# Patient Record
Sex: Male | Born: 1965 | Race: White | Hispanic: No | Smoking: Current every day smoker
Health system: Southern US, Community
[De-identification: ages and names within clinical notes are randomized; demographics above are authoritative.]

## PROBLEM LIST (undated history)

## (undated) DIAGNOSIS — J45909 Unspecified asthma, uncomplicated: Secondary | ICD-10-CM

## (undated) DIAGNOSIS — E119 Type 2 diabetes mellitus without complications: Secondary | ICD-10-CM

## (undated) DIAGNOSIS — J438 Other emphysema: Secondary | ICD-10-CM

## (undated) DIAGNOSIS — I10 Essential (primary) hypertension: Secondary | ICD-10-CM

## (undated) HISTORY — DX: Essential (primary) hypertension: I10

## (undated) HISTORY — DX: Other emphysema: J43.8

---

## 2000-05-30 ENCOUNTER — Emergency Department (HOSPITAL_COMMUNITY): Admission: EM | Admit: 2000-05-30 | Discharge: 2000-05-30 | Payer: Self-pay | Admitting: Emergency Medicine

## 2000-05-30 ENCOUNTER — Encounter: Payer: Self-pay | Admitting: Emergency Medicine

## 2014-02-21 ENCOUNTER — Ambulatory Visit (HOSPITAL_COMMUNITY)
Admission: RE | Admit: 2014-02-21 | Discharge: 2014-02-21 | Disposition: A | Discharge status: Home / Self Care | Payer: BC Managed Care – PPO | Source: Ambulatory Visit | Attending: Family Medicine | Admitting: Family Medicine

## 2014-02-21 ENCOUNTER — Other Ambulatory Visit (HOSPITAL_COMMUNITY): Payer: Self-pay | Admitting: Family Medicine

## 2014-02-21 DIAGNOSIS — R918 Other nonspecific abnormal finding of lung field: Secondary | ICD-10-CM | POA: Insufficient documentation

## 2014-02-21 DIAGNOSIS — R053 Chronic cough: Secondary | ICD-10-CM

## 2014-02-21 DIAGNOSIS — R05 Cough: Secondary | ICD-10-CM

## 2014-02-21 DIAGNOSIS — F17200 Nicotine dependence, unspecified, uncomplicated: Secondary | ICD-10-CM

## 2014-02-21 DIAGNOSIS — R059 Cough, unspecified: Secondary | ICD-10-CM | POA: Insufficient documentation

## 2014-07-14 ENCOUNTER — Encounter: Payer: Self-pay | Admitting: *Deleted

## 2014-07-15 ENCOUNTER — Ambulatory Visit (INDEPENDENT_AMBULATORY_CARE_PROVIDER_SITE_OTHER): Payer: BC Managed Care – PPO | Admitting: Cardiology

## 2014-07-15 ENCOUNTER — Telehealth: Payer: Self-pay | Admitting: Cardiology

## 2014-07-15 ENCOUNTER — Encounter: Payer: Self-pay | Admitting: Cardiology

## 2014-07-15 VITALS — BP 142/95 | HR 65 | Ht 69.0 in | Wt 213.0 lb

## 2014-07-15 DIAGNOSIS — R079 Chest pain, unspecified: Secondary | ICD-10-CM

## 2014-07-15 DIAGNOSIS — I10 Essential (primary) hypertension: Secondary | ICD-10-CM | POA: Insufficient documentation

## 2014-07-15 NOTE — Patient Instructions (Signed)
Your physician has requested that you have an echocardiogram. Echocardiography is a painless test that uses sound waves to create images of your heart. It provides your doctor with information about the size and shape of your heart and how well your heart's chambers and valves are working. This procedure takes approximately one hour. There are no restrictions for this procedure. Office will contact with results via phone or letter.   Continue all current medications. Your physician has requested that you regularly monitor and record your blood pressure readings at home. Please take reading approximately 2 hours after taking medication.  Bring readings to next MD visit for his review.   Follow up in  1 month

## 2014-07-15 NOTE — Telephone Encounter (Signed)
PRECERT NEEDED FOR ECHO TO BE DONE AT [facility] j[facility]TH @10 ;5

## 2014-07-15 NOTE — Telephone Encounter (Signed)
Auth # [id number] exp 08/13/14

## 2014-07-15 NOTE — Progress Notes (Signed)
Clinical Summary Christopher Cross is a 48 y.o.male seen today as a new patient for the following medical problems.  1. HTN - from records appears bp have been difficult to control - started on clonidine 0.1 mg tid by pcp just a few days ago, home pressures have trended down to systolics 130-140s.    2. Chest pain - started approx 2-3 weeks ago. Sharp pain, midchest 6/10. Can occur at rest or with exertion. +SOB. Feels hot and sweaty. Nothing makes pain better or worst. Has occurred approx 4 times over the last 2 weeks. Can be associated with headaches at times.  - often associated with high blood pressures. Symptoms somewhat improved since starting new antihypertensives.  - has had some increasing DOE over the last several weeks. No LE edema. Can have some occasional orthopnea.    CAD risk factors: HTN, + tobacco, maternal grandfather 3 MI, maternal uncle MI 52, father CVAs.   Past Medical History  Diagnosis Date  . Unspecified essential hypertension   . Other emphysema      No Known Allergies   Current Outpatient Prescriptions  Medication Sig Dispense Refill  . aspirin 325 MG tablet Take 325 mg by mouth daily.      Marland Kitchen atenolol (TENORMIN) 50 MG tablet Take 50 mg by mouth daily.      . cloNIDine (CATAPRES) 0.1 MG tablet Take 0.1 mg by mouth 3 (three) times daily.       No current facility-administered medications for this visit.     No past surgical history on file.   No Known Allergies    Family History  Problem Relation Age of Onset  . Heart attack Other     Grandfather  . Heart attack Other     Uncle     Social History Christopher Cross reports that he has been smoking Cigarettes.  He started smoking about 31 years ago. He has been smoking about 1.50 packs per day. He has never used smokeless tobacco. Christopher Cross has no alcohol history on file.   Review of Systems CONSTITUTIONAL: No weight loss, fever, chills, weakness or fatigue.  HEENT: Eyes: No visual loss, blurred  vision, double vision or yellow sclerae.No hearing loss, sneezing, congestion, runny nose or sore throat.  SKIN: No rash or itching.  CARDIOVASCULAR: per HPI RESPIRATORY: No shortness of breath, cough or sputum.  GASTROINTESTINAL: No anorexia, nausea, vomiting or diarrhea. No abdominal pain or blood.  GENITOURINARY: No burning on urination, no polyuria NEUROLOGICAL: No headache, dizziness, syncope, paralysis, ataxia, numbness or tingling in the extremities. No change in bowel or bladder control.  MUSCULOSKELETAL: No muscle, back pain, joint pain or stiffness.  LYMPHATICS: No enlarged nodes. No history of splenectomy.  PSYCHIATRIC: No history of depression or anxiety.  ENDOCRINOLOGIC: No reports of sweating, cold or heat intolerance. No polyuria or polydipsia.  Marland Kitchen   Physical Examination p 65 bp 142/95 Wt 213 lbs BMI 31 Gen: resting comfortably, no acute distress HEENT: no scleral icterus, pupils equal round and reactive, no palptable cervical adenopathy,  CV: RRR, no m/r/g, no JVD, no carotid bruits Resp: expiratory wheezing GI: abdomen is soft, non-tender, non-distended, normal bowel sounds, no hepatosplenomegaly MSK: extremities are warm, no edema.  Skin: warm, no rash Neuro:  no focal deficits Psych: appropriate affect   Diagnostic Studies  EKG Sinus, ST depressions inferior and lateral precordial leads suggestive of strain pattern   Assessment and Plan  1. HTN - followed by pcp, just recently started on  clonidine with blood pressures trending down - consider alternative to clonidine such as CCB or diuretic that is once daily with less side effects. Pending his echo results, he may have secondary indication for alternative therapy, will f/u echo results. Will request labs from pcp - he will keep bp log and bring next visit   2. Chest pain - unclear etiology, often associated with elevated bp's. Will obtain echo as first step. EKG is abnormal with ST/T changes in inferior and  lateral precordial leads. Pending echo results will need non-invasive ischemic testing, invasive testing if low EF or WMA. - change ASA to 81mg  once his bottle of 325 runs out   3. Probable COPD - smoking history, expiratory wheezing on exam - defer management to pcp   F/u 4 weeks      Antoine PocheJonathan F. Branch, M.D., F.A.C.C.

## 2014-07-16 ENCOUNTER — Ambulatory Visit (HOSPITAL_COMMUNITY)
Admission: RE | Admit: 2014-07-16 | Discharge: 2014-07-16 | Disposition: A | Discharge status: Home / Self Care | Payer: BC Managed Care – PPO | Source: Ambulatory Visit | Attending: Cardiology | Admitting: Cardiology

## 2014-07-16 DIAGNOSIS — I519 Heart disease, unspecified: Secondary | ICD-10-CM

## 2014-07-16 DIAGNOSIS — R079 Chest pain, unspecified: Secondary | ICD-10-CM

## 2014-07-16 NOTE — Progress Notes (Signed)
  Echocardiogram 2D Echocardiogram has been performed.  Lyniah Fujita 07/16/2014, 11:24 AM

## 2014-07-25 ENCOUNTER — Encounter: Payer: Self-pay | Admitting: *Deleted

## 2014-07-25 ENCOUNTER — Telehealth: Payer: Self-pay | Admitting: *Deleted

## 2014-07-25 DIAGNOSIS — R079 Chest pain, unspecified: Secondary | ICD-10-CM

## 2014-07-25 NOTE — Telephone Encounter (Signed)
Message copied by Lesle Chris on Fri Jul 25, 2014  4:32 PM ------      Message from: [place] F      Created: Wed Jul 23, 2014 12:37 PM       Please let patient know that overall his echo looks good. He needs a stress test to further look into his chest pain. Please order nuclear stress, if he can run exercise if not then lexiscan. If exercise will need to hold atenolol day of            Dominga Ferry MD ------

## 2014-07-25 NOTE — Telephone Encounter (Signed)
Notes Recorded by Lesle Chris, LPN on 05/20/2243 at 4:30 PM Patient notified & verbalized understanding. Will put order in & forward to [facility]) for scheduling. Patient feels like he could do the Exercise Cardiolite. Advised him to have nothing to eat / drink x 4 hours prior to test & no caffeine x 24 hours. Also, he will need to hold his Atenolol the morning of test only.

## 2014-08-01 ENCOUNTER — Encounter (HOSPITAL_COMMUNITY): Payer: Self-pay

## 2014-08-01 ENCOUNTER — Encounter (HOSPITAL_COMMUNITY)
Admission: RE | Admit: 2014-08-01 | Discharge: 2014-08-01 | Disposition: A | Discharge status: Home / Self Care | Payer: BC Managed Care – PPO | Source: Ambulatory Visit | Attending: Cardiology | Admitting: Cardiology

## 2014-08-01 ENCOUNTER — Ambulatory Visit (HOSPITAL_COMMUNITY)
Admission: RE | Admit: 2014-08-01 | Discharge: 2014-08-01 | Disposition: A | Discharge status: Home / Self Care | Payer: BC Managed Care – PPO | Source: Ambulatory Visit | Attending: Cardiology | Admitting: Cardiology

## 2014-08-01 DIAGNOSIS — R079 Chest pain, unspecified: Secondary | ICD-10-CM | POA: Diagnosis present

## 2014-08-01 MED ORDER — SODIUM CHLORIDE 0.9 % IJ SOLN
10.0000 mL | INTRAMUSCULAR | Status: DC | PRN
Start: 2014-08-01 — End: 2014-08-02
  Administered 2014-08-01: 10 mL via INTRAVENOUS

## 2014-08-01 MED ORDER — TECHNETIUM TC 99M SESTAMIBI - CARDIOLITE
10.0000 | Freq: Once | INTRAVENOUS | Status: AC | PRN
  Administered 2014-08-01: 10 via INTRAVENOUS

## 2014-08-01 MED ORDER — REGADENOSON 0.4 MG/5ML IV SOLN
INTRAVENOUS | Status: AC
  Filled 2014-08-01: qty 5

## 2014-08-01 MED ORDER — TECHNETIUM TC 99M SESTAMIBI GENERIC - CARDIOLITE
30.0000 | Freq: Once | INTRAVENOUS | Status: AC | PRN
  Administered 2014-08-01: 30 via INTRAVENOUS

## 2014-08-01 MED ORDER — SODIUM CHLORIDE 0.9 % IJ SOLN
INTRAMUSCULAR | Status: AC
  Administered 2014-08-01: 10 mL via INTRAVENOUS
  Filled 2014-08-01: qty 10

## 2014-08-01 NOTE — Progress Notes (Signed)
Stress Lab Nurses Notes - Christopher Cross 08/01/2014 Reason for doing test: Chest Pain and HTN Type of test: Stress Cardiolite Nurse performing test: Parke Poisson, RN Nuclear Medicine Tech: Lyndel Pleasure Echo Tech: Not Applicable MD performing test: Nilda Riggs MD: [facility] Test explained and consent signed: Yes.   IV started: Saline lock flushed, No redness or edema and Saline lock started in radiology Symptoms: SOB Treatment/Intervention: None Reason test stopped: fatigue and reached target HR After recovery IV was: Discontinued via X-ray tech and No redness or edema Patient to return to Nuc. Med at : 11:35 Patient discharged: Home Patient's Condition upon discharge was: stable Comments: During test peak BP 220/101 & HR 164.  Recovery BP 168/102 & HR 81.  Symptoms resolved in recovery.  Christopher Cross

## 2014-08-06 ENCOUNTER — Telehealth: Payer: Self-pay | Admitting: *Deleted

## 2014-08-06 ENCOUNTER — Encounter (HOSPITAL_COMMUNITY)
Admission: RE | Admit: 2014-08-06 | Discharge: 2014-08-06 | Disposition: A | Discharge status: Home / Self Care | Payer: BC Managed Care – PPO | Source: Ambulatory Visit | Attending: Cardiology | Admitting: Cardiology

## 2014-08-06 DIAGNOSIS — R079 Chest pain, unspecified: Secondary | ICD-10-CM

## 2014-08-06 HISTORY — DX: Unspecified asthma, uncomplicated: J45.909

## 2014-08-06 NOTE — Telephone Encounter (Signed)
Message copied by Jerrye Beavers on Wed Aug 06, 2014  2:56 PM ------      Message from: [place] F      Created: Tue Aug 05, 2014  9:34 AM       Negative stress test. Does not appear his chest pain is heart related. Previously scheduled follow up            Dominga Ferry MD ------

## 2014-08-06 NOTE — Telephone Encounter (Signed)
Notes Recorded by Lesle Chris, LPN on 8/50/2774 at 4:50 PM Patient notified and verbalized understanding. Will keep already scheduled follow up for 08/15/2014 with Dr. Wyline Mood. ------  Notes Recorded by Jerrye Beavers, CMA on 08/06/2014 at 2:55 PM Left message to return call. ------  Notes Recorded by Antoine Poche, MD on 08/05/2014 at 9:34 AM Negative stress test. Does not appear his chest pain is heart related. Previously scheduled follow up  Dominga Ferry MD

## 2014-08-15 ENCOUNTER — Ambulatory Visit (INDEPENDENT_AMBULATORY_CARE_PROVIDER_SITE_OTHER): Payer: BC Managed Care – PPO | Admitting: Cardiology

## 2014-08-15 ENCOUNTER — Encounter: Payer: Self-pay | Admitting: Cardiology

## 2014-08-15 VITALS — BP 148/96 | HR 68 | Ht 69.0 in | Wt 204.4 lb

## 2014-08-15 DIAGNOSIS — I10 Essential (primary) hypertension: Secondary | ICD-10-CM

## 2014-08-15 DIAGNOSIS — R079 Chest pain, unspecified: Secondary | ICD-10-CM

## 2014-08-15 MED ORDER — CLONIDINE HCL 0.1 MG PO TABS: 0.1000 mg | ORAL_TABLET | Freq: Two times a day (BID) | ORAL | Status: DC

## 2014-08-15 MED ORDER — RANITIDINE HCL 150 MG PO TABS: 150.0000 mg | ORAL_TABLET | Freq: Two times a day (BID) | ORAL | Status: DC

## 2014-08-15 MED ORDER — AMLODIPINE BESYLATE 5 MG PO TABS: 5.0000 mg | ORAL_TABLET | Freq: Every day | ORAL | Status: DC

## 2014-08-15 MED ORDER — ASPIRIN EC 81 MG PO TBEC: 81.0000 mg | DELAYED_RELEASE_TABLET | Freq: Every day | ORAL | Status: DC

## 2014-08-15 NOTE — Progress Notes (Signed)
Clinical Summary Christopher Cross is a 48 y.o.male seen today for follow up of the following medical problems.   1. HTN  - from records appears bp have been difficult to control  - started on clonidine 0.1 mg tid by pcp recently.  - checks at home, typically around 150-160/80s.   2. Chest pain  - described at last visit, since that time completed echo with normal LV systolic function, stress MPI with no ischemia and low risk Duke score of 7.  - no recurrent symptoms since last visit   Past Medical History  Diagnosis Date  . Unspecified essential hypertension   . Other emphysema   . Asthma      No Known Allergies   Current Outpatient Prescriptions  Medication Sig Dispense Refill  . aspirin 325 MG tablet Take 325 mg by mouth daily.      Marland Kitchen atenolol (TENORMIN) 50 MG tablet Take 50 mg by mouth daily.      . cloNIDine (CATAPRES) 0.1 MG tablet Take 0.1 mg by mouth 3 (three) times daily.      . nitroGLYCERIN (NITROSTAT) 0.4 MG SL tablet Place 0.4 mg under the tongue every 5 (five) minutes as needed for chest pain.       No current facility-administered medications for this visit.     No past surgical history on file.   No Known Allergies    Family History  Problem Relation Age of Onset  . Heart attack Other     Grandfather  . Heart attack Other     Uncle     Social History Christopher Cross reports that he has been smoking Cigarettes.  He started smoking about 31 years ago. He has been smoking about 1.50 packs per day. He has never used smokeless tobacco. Mr. Markunas has no alcohol history on file.   Review of Systems CONSTITUTIONAL: No weight loss, fever, chills, weakness or fatigue.  HEENT: Eyes: No visual loss, blurred vision, double vision or yellow sclerae.No hearing loss, sneezing, congestion, runny nose or sore throat.  SKIN: No rash or itching.  CARDIOVASCULAR: per HPI RESPIRATORY: No shortness of breath, cough or sputum.  GASTROINTESTINAL: No anorexia, nausea,  vomiting or diarrhea. No abdominal pain or blood.  GENITOURINARY: No burning on urination, no polyuria NEUROLOGICAL: No headache, dizziness, syncope, paralysis, ataxia, numbness or tingling in the extremities. No change in bowel or bladder control.  MUSCULOSKELETAL: No muscle, back pain, joint pain or stiffness.  LYMPHATICS: No enlarged nodes. No history of splenectomy.  PSYCHIATRIC: No history of depression or anxiety.  ENDOCRINOLOGIC: No reports of sweating, cold or heat intolerance. No polyuria or polydipsia.  Marland Kitchen   Physical Examination p 68 bp 148/96 Wt 204 lbs BMI 30 Gen: resting comfortably, no acute distress HEENT: no scleral icterus, pupils equal round and reactive, no palptable cervical adenopathy,  CV: RRR, no m/r/g, no JVD, no carotid brutis Resp: Clear to auscultation bilaterally GI: abdomen is soft, non-tender, non-distended, normal bowel sounds, no hepatosplenomegaly MSK: extremities are warm, no edema.  Skin: warm, no rash Neuro:  no focal deficits Psych: appropriate affect   Diagnostic Studies 06/2014 Echo Study Conclusions  - Left ventricle: The cavity size was at the upper limits of normal. Wall thickness was normal. Systolic function was normal. The estimated ejection fraction was in the range of 50% to 55%. Wall motion was normal; there were no regional wall motion abnormalities. Doppler parameters are consistent with abnormal left ventricular relaxation (grade 1 diastolic dysfunction). -  Aortic valve: Mildly calcified annulus. Trileaflet. There was no significant regurgitation. - Mitral valve: There was trivial regurgitation. - Right atrium: Central venous pressure (est): 3 mm Hg. - Atrial septum: No defect or patent foramen ovale was identified. - Tricuspid valve: There was physiologic regurgitation. - Pulmonary arteries: Systolic pressure could not be accurately estimated. - Pericardium, extracardiac: There was no pericardial effusion.  Impressions:  -  Upper normal LV chamber size with LVEF 50-55%, grade 1 diastolic dysfunction. No major valvular abnormalities. Unable to assess PASP.  07/2014 MPI  IMPRESSION:  1. Negative exercise MPI for ischemia  2. Normal LV systolic function, LVEF 48%  3. Duke score of 7, consistent with low risk for major cardiac  events.  4. Very good exercise function capacity (120% of predicted based on  age and gender).   Assessment and Plan  1. HTN  - followed by pcp, just recently started on clonidine with blood pressures trending down  - consider alternative to clonidine such as CCB or diuretic that is once daily with less side effects. Will start norvasc  daily, decrease clonidine to 0.1mg  bid with goal of further weaning after bp log submitted in 2 weeks. If needs further bp control increase norvasc, next agent would be thiazide diuretic.  - given info on DASH diet   2. Chest pain  - normal echo and stress test, no evidence for caridiac cause - does describe some GERD at times, recommended OTC zantac  bid    F/u 4 months. BP log to be submitted in 2 weeks.    Antoine Poche, M.D., F.A.C.C.

## 2014-08-15 NOTE — Patient Instructions (Signed)
   Decrease Aspirin to 81mg  daily   Decrease Clonidine to 0.1mg  twice a day    Begin OTC Zantac 150mg  twice a day    Begin Norvasc 5mg  daily - new sent to pharm  DASH diet info provided  Your physician has requested that you regularly monitor and record your blood pressure readings at home. Please take readings approximately 2 hours after medication x 2 weeks & return to office for MD review. Continue all other medications.   Your physician wants you to follow up in:  4 months.  You will receive a reminder letter in the mail one-two months in advance.  If you don't receive a letter, please call our office to schedule the follow up appointment

## 2014-09-22 ENCOUNTER — Telehealth: Payer: Self-pay | Admitting: *Deleted

## 2014-09-22 NOTE — Telephone Encounter (Signed)
Message copied by Jerrye Beavers on Mon Sep 22, 2014  2:25 PM ------      Message from: [place] F      Created: Mon Sep 22, 2014  8:56 AM       BP log reviewed, on average bp's too elevated. Would increase norvasc to 10mg  daily. Continue check home bp's 2-3 times a week until next follow up with [place] or primary                  Dominga Ferry MD ------

## 2014-09-22 NOTE — Telephone Encounter (Signed)
Line busy, will call back later

## 2014-09-23 NOTE — Telephone Encounter (Signed)
Left message to return call 

## 2014-09-26 NOTE — Telephone Encounter (Signed)
Line rings. Unable to leave message

## 2014-10-06 ENCOUNTER — Other Ambulatory Visit: Payer: Self-pay | Admitting: *Deleted

## 2014-10-06 ENCOUNTER — Encounter: Payer: Self-pay | Admitting: *Deleted

## 2014-10-06 MED ORDER — AMLODIPINE BESYLATE 10 MG PO TABS: 10.0000 mg | ORAL_TABLET | Freq: Every day | ORAL | Status: DC

## 2014-10-06 NOTE — Telephone Encounter (Signed)
Unable to reach pt after 3 attempts. Letter will be mailed and rx has been sent to pharmacy.

## 2014-12-02 ENCOUNTER — Encounter: Payer: Self-pay | Admitting: Cardiology

## 2014-12-02 ENCOUNTER — Ambulatory Visit (INDEPENDENT_AMBULATORY_CARE_PROVIDER_SITE_OTHER): Payer: BC Managed Care – PPO | Admitting: Cardiology

## 2014-12-02 VITALS — BP 110/71 | HR 64 | Ht 69.0 in | Wt 198.0 lb

## 2014-12-02 DIAGNOSIS — R079 Chest pain, unspecified: Secondary | ICD-10-CM

## 2014-12-02 DIAGNOSIS — Z720 Tobacco use: Secondary | ICD-10-CM

## 2014-12-02 DIAGNOSIS — I10 Essential (primary) hypertension: Secondary | ICD-10-CM

## 2014-12-02 MED ORDER — ALBUTEROL SULFATE HFA 108 (90 BASE) MCG/ACT IN AERS: 1.0000 | INHALATION_SPRAY | Freq: Four times a day (QID) | RESPIRATORY_TRACT | Status: DC | PRN

## 2014-12-02 MED ORDER — ATENOLOL 50 MG PO TABS: 50.0000 mg | ORAL_TABLET | Freq: Every day | ORAL | Status: DC

## 2014-12-02 MED ORDER — AMLODIPINE BESYLATE 10 MG PO TABS: 10.0000 mg | ORAL_TABLET | Freq: Every day | ORAL | Status: DC

## 2014-12-02 NOTE — Patient Instructions (Signed)
   Stop Clonidine  Refills given on Atenolol, Amlodipine, & Albuterol Your physician has requested that you regularly monitor and record your blood pressure readings at home. Please take readings approximately 2 hours after medication. Take 2-3 x per week for 2 weeks & return to office for MD review.  Continue all other medications.   Your physician wants you to follow up in:  1 year.  You will receive a reminder letter in the mail one-two months in advance.  If you don't receive a letter, please call our office to schedule the follow up appointment

## 2014-12-02 NOTE — Progress Notes (Signed)
Clinical Summary [name] is a 48 y.o.male seen today for follow up of the following medical problems.   1. HTN  - checks at home occasionally, does not recall numbers - compliant with meds, we have been weaning his clonidine, he is down to 0.1mg  once daily.   2. Chest pain  - described at last visit, since that time completed echo with normal LV systolic function, stress MPI with no ischemia and low risk Duke score of 7.  - no recurrent symptoms since last visit  3. Tobacco abuse - no benefit with ecig, notes some success with nicotine patches in the past.    Past Medical History  Diagnosis Date  . Unspecified essential hypertension   . Other emphysema   . Asthma      No Known Allergies   Current Outpatient Prescriptions  Medication Sig Dispense Refill  . albuterol (PROVENTIL HFA;VENTOLIN HFA) 108 (90 BASE) MCG/ACT inhaler Inhale 1-2 puffs into the lungs every 6 (six) hours as needed for wheezing or shortness of breath.    Marland Kitchen. amLODipine (NORVASC) 10 MG tablet Take 1 tablet (10 mg total) by mouth daily. 30 tablet 6  . aspirin EC 81 MG tablet Take 1 tablet (81 mg total) by mouth daily.    Marland Kitchen. atenolol (TENORMIN) 50 MG tablet Take 50 mg by mouth daily.    . cloNIDine (CATAPRES) 0.1 MG tablet Take 1 tablet (0.1 mg total) by mouth 2 (two) times daily.    . nitroGLYCERIN (NITROSTAT) 0.4 MG SL tablet Place 0.4 mg under the tongue every 5 (five) minutes as needed for chest pain.    . ranitidine (ZANTAC) 150 MG tablet Take 1 tablet (150 mg total) by mouth 2 (two) times daily.     No current facility-administered medications for this visit.     No past surgical history on file.   No Known Allergies    Family History  Problem Relation Age of Onset  . Heart attack Other     Grandfather  . Heart attack Other     Uncle     Social History [name] reports that he has been smoking Cigarettes.  He started smoking about 31 years ago. He has been smoking about 1.50  packs per day. He has never used smokeless tobacco. [name] has no alcohol history on file.   Review of Systems CONSTITUTIONAL: No weight loss, fever, chills, weakness or fatigue.  HEENT: Eyes: No visual loss, blurred vision, double vision or yellow sclerae.No hearing loss, sneezing, congestion, runny nose or sore throat.  SKIN: No rash or itching.  CARDIOVASCULAR: per HPI RESPIRATORY: No shortness of breath, cough or sputum.  GASTROINTESTINAL: No anorexia, nausea, vomiting or diarrhea. No abdominal pain or blood.  GENITOURINARY: No burning on urination, no polyuria NEUROLOGICAL: No headache, dizziness, syncope, paralysis, ataxia, numbness or tingling in the extremities. No change in bowel or bladder control.  MUSCULOSKELETAL: No muscle, back pain, joint pain or stiffness.  LYMPHATICS: No enlarged nodes. No history of splenectomy.  PSYCHIATRIC: No history of depression or anxiety.  ENDOCRINOLOGIC: No reports of sweating, cold or heat intolerance. No polyuria or polydipsia.  Marland Kitchen.   Physical Examination p 64 bp 110/71 Wt 198 lbs BMI 29 Gen: resting comfortably, no acute distress HEENT: no scleral icterus, pupils equal round and reactive, no palptable cervical adenopathy,  CV: RRR, no m/r/g, no JVD, no carotid bruits Resp: Clear to auscultation bilaterally GI: abdomen is soft, non-tender, non-distended, normal bowel sounds, no hepatosplenomegaly  MSK: extremities are warm, no edema.  Skin: warm, no rash Neuro:  no focal deficits Psych: appropriate affect   Diagnostic Studies 06/2014 Echo Study Conclusions  - Left ventricle: The cavity size was at the upper limits of normal. Wall thickness was normal. Systolic function was normal. The estimated ejection fraction was in the range of 50% to 55%. Wall motion was normal; there were no regional wall motion abnormalities. Doppler parameters are consistent with abnormal left ventricular relaxation (grade 1 diastolic dysfunction). - Aortic  valve: Mildly calcified annulus. Trileaflet. There was no significant regurgitation. - Mitral valve: There was trivial regurgitation. - Right atrium: Central venous pressure (est): 3 mm Hg. - Atrial septum: No defect or patent foramen ovale was identified. - Tricuspid valve: There was physiologic regurgitation. - Pulmonary arteries: Systolic pressure could not be accurately estimated. - Pericardium, extracardiac: There was no pericardial effusion.  Impressions:  - Upper normal LV chamber size with LVEF 50-55%, grade 1 diastolic dysfunction. No major valvular abnormalities. Unable to assess PASP.  07/2014 MPI  IMPRESSION:  1. Negative exercise MPI for ischemia  2. Normal LV systolic function, LVEF 48%  3. Duke score of 7, consistent with low risk for major cardiac  events.  4. Very good exercise function capacity (120% of predicted based on  age and gender).    Assessment and Plan  1. HTN  - at goal, we will discontinue his clonidicine. Continue norvasc and atenolol. - he will submit bp log in 2 weeks  2. Chest pain  - normal echo and stress test, no evidence for caridiac cause - no recent symptoms  3. Tobacco abuse - precontemplative, not ready to quit    F/u 1 year    Antoine Poche, M.D.

## 2015-02-04 ENCOUNTER — Other Ambulatory Visit: Payer: Self-pay | Admitting: Cardiology

## 2015-02-04 NOTE — Telephone Encounter (Signed)
Patient walked into clinic requesting medication.  He took his last pill this morning.  Uses [facility] atenolol (TENORMIN) 50 MG tablet   Also, he is requesting a new BP sheet

## 2015-02-04 NOTE — Telephone Encounter (Signed)
Atenolol was refilled on 11/2014 with 11 refills to [facility]. LM for pt. Will mail BP log.

## 2015-11-26 ENCOUNTER — Ambulatory Visit: Payer: Self-pay | Admitting: Cardiology

## 2015-12-05 ENCOUNTER — Other Ambulatory Visit: Payer: Self-pay | Admitting: Cardiology

## 2015-12-28 ENCOUNTER — Encounter: Payer: Self-pay | Admitting: Cardiology

## 2015-12-28 ENCOUNTER — Ambulatory Visit (INDEPENDENT_AMBULATORY_CARE_PROVIDER_SITE_OTHER): Payer: 59 | Admitting: Cardiology

## 2015-12-28 VITALS — BP 162/88 | HR 71 | Ht 69.0 in | Wt 225.8 lb

## 2015-12-28 DIAGNOSIS — I10 Essential (primary) hypertension: Secondary | ICD-10-CM | POA: Diagnosis not present

## 2015-12-28 DIAGNOSIS — R600 Localized edema: Secondary | ICD-10-CM

## 2015-12-28 DIAGNOSIS — R079 Chest pain, unspecified: Secondary | ICD-10-CM | POA: Diagnosis not present

## 2015-12-28 MED ORDER — CHLORTHALIDONE 25 MG PO TABS: 12.5000 mg | ORAL_TABLET | Freq: Every day | ORAL | Status: DC

## 2015-12-28 MED ORDER — AMLODIPINE BESYLATE 10 MG PO TABS: 10.0000 mg | ORAL_TABLET | Freq: Every day | ORAL | Status: DC

## 2015-12-28 MED ORDER — ATENOLOL 50 MG PO TABS: 50.0000 mg | ORAL_TABLET | Freq: Every day | ORAL | Status: DC

## 2015-12-28 NOTE — Progress Notes (Signed)
Patient ID: Christopher Cross, male   DOB: Feb 16, 1966, 50 y.o.   MRN: [id number]     Clinical Summary Christopher Cross is a 50 y.o.male seen today for follow up of the following medical problems.   1. HTN  - compliant with meds - he does not check regularly at home   2. History of chest pain  - 03/2014  stress MPI with no ischemia and low risk Duke score of 7.  - denies any recent symptoms.   3. LE edema - started over the last few days. He reports very heavy salt intake over the holidays as well as over the last few days.   Past Medical History  Diagnosis Date  . Unspecified essential hypertension   . Other emphysema   . Asthma      No Known Allergies   Current Outpatient Prescriptions  Medication Sig Dispense Refill  . albuterol (PROVENTIL HFA;VENTOLIN HFA) 108 (90 BASE) MCG/ACT inhaler Inhale 1-2 puffs into the lungs every 6 (six) hours as needed for wheezing or shortness of breath. 1 each 2  . amLODipine (NORVASC) 10 MG tablet TAKE 1 TABLET BY MOUTH EVERY DAY 30 tablet 0  . aspirin EC 81 MG tablet Take 1 tablet (81 mg total) by mouth daily.    Marland Kitchen atenolol (TENORMIN) 50 MG tablet Take 1 tablet (50 mg total) by mouth daily. 30 tablet 11  . nitroGLYCERIN (NITROSTAT) 0.4 MG SL tablet Place 0.4 mg under the tongue every 5 (five) minutes as needed for chest pain.    . ranitidine (ZANTAC) 150 MG tablet Take 1 tablet (150 mg total) by mouth 2 (two) times daily.     No current facility-administered medications for this visit.     No past surgical history on file.   No Known Allergies    Family History  Problem Relation Age of Onset  . Heart attack Other     Grandfather  . Heart attack Other     Uncle     Social History Christopher Cross reports that he has been smoking Cigarettes.  He started smoking about 32 years ago. He has been smoking about 1.50 packs per day. He has never used smokeless tobacco. Christopher Cross has no alcohol history on file.   Review of Systems CONSTITUTIONAL:  No weight loss, fever, chills, weakness or fatigue.  HEENT: Eyes: No visual loss, blurred vision, double vision or yellow sclerae.No hearing loss, sneezing, congestion, runny nose or sore throat.  SKIN: No rash or itching.  CARDIOVASCULAR: per HPI RESPIRATORY: No shortness of breath, cough or sputum.  GASTROINTESTINAL: No anorexia, nausea, vomiting or diarrhea. No abdominal pain or blood.  GENITOURINARY: No burning on urination, no polyuria NEUROLOGICAL: No headache, dizziness, syncope, paralysis, ataxia, numbness or tingling in the extremities. No change in bowel or bladder control.  MUSCULOSKELETAL: No muscle, back pain, joint pain or stiffness.  LYMPHATICS: No enlarged nodes. No history of splenectomy.  PSYCHIATRIC: No history of depression or anxiety.  ENDOCRINOLOGIC: No reports of sweating, cold or heat intolerance. No polyuria or polydipsia.  Marland Kitchen   Physical Examination Filed Vitals:   12/28/15 1538  BP: 162/88  Pulse: 71   Filed Vitals:   12/28/15 1538  Height:  (1.753 m)  Weight: 225 lb 12.8 oz (102.422 kg)    Gen: resting comfortably, no acute distress HEENT: no scleral icterus, pupils equal round and reactive, no palptable cervical adenopathy,  CV: RRR, no m/r/g, no jvd Resp: Clear to auscultation bilaterally GI: abdomen is  soft, non-tender, non-distended, normal bowel sounds, no hepatosplenomegaly MSK: extremities are warm, no edema.  Skin: warm, no rash Neuro:  no focal deficits Psych: appropriate affect   Diagnostic Studies  06/2014 Echo Study Conclusions  - Left ventricle: The cavity size was at the upper limits of normal. Wall thickness was normal. Systolic function was normal. The estimated ejection fraction was in the range of 50% to 55%. Wall motion was normal; there were no regional wall motion abnormalities. Doppler parameters are consistent with abnormal left ventricular relaxation (grade 1 diastolic dysfunction). - Aortic valve: Mildly calcified  annulus. Trileaflet. There was no significant regurgitation. - Mitral valve: There was trivial regurgitation. - Right atrium: Central venous pressure (est): 3 mm Hg. - Atrial septum: No defect or patent foramen ovale was identified. - Tricuspid valve: There was physiologic regurgitation. - Pulmonary arteries: Systolic pressure could not be accurately estimated. - Pericardium, extracardiac: There was no pericardial effusion.  Impressions:  - Upper normal LV chamber size with LVEF 50-55%, grade 1 diastolic dysfunction. No major valvular abnormalities. Unable to assess PASP.  07/2014 MPI  IMPRESSION:  1. Negative exercise MPI for ischemia  2. Normal LV systolic function, LVEF 48%  3. Duke score of 7, consistent with low risk for major cardiac  events.  4. Very good exercise function capacity (120% of predicted based on  age and gender).      Assessment and Plan   1. HTN  - above goal. We will start chlorthalidone 12.5mg  daily, check BMET and Mg in 2 weeks.  - he will drop off bp log in 1 week  2. Chest pain  - previous normal stress test, no recurrent symptpoms. Continue to monitor  3. LE edema - likely related to recent heavy sodium intake. Echo 06/2014 with normal LVEF, grade I diastolic dysfunction - start chlorthalidone 12.5mg  daily.    F/u 2 months  Christopher Cross, M.D.

## 2015-12-28 NOTE — Patient Instructions (Signed)
Your physician recommends that you schedule a follow-up appointment in: 2 MONTHS WITH DR. BRANCH  Your physician has recommended you make the following change in your medication:   START CHLORTHALIDONE 12.5 MG DAILY  Your physician recommends that you return for lab work in: 2 WEEKS BMP/MG  Your physician has requested that you regularly monitor and record your blood pressure readings at home FOR 1 WEEK AND CALL us WITH RESULTS. Please use the same machine at the same time of day to check your readings and record them to bring to your follow-up visit.  Thank you for choosing [facility]!!

## 2016-01-11 ENCOUNTER — Encounter: Payer: Self-pay | Admitting: *Deleted

## 2016-03-01 ENCOUNTER — Encounter: Payer: Self-pay | Admitting: Cardiology

## 2016-03-01 ENCOUNTER — Ambulatory Visit (INDEPENDENT_AMBULATORY_CARE_PROVIDER_SITE_OTHER): Payer: 59 | Admitting: Cardiology

## 2016-03-01 ENCOUNTER — Encounter: Payer: Self-pay | Admitting: *Deleted

## 2016-03-01 VITALS — BP 143/84 | HR 64 | Ht 69.0 in | Wt 211.2 lb

## 2016-03-01 DIAGNOSIS — R600 Localized edema: Secondary | ICD-10-CM | POA: Diagnosis not present

## 2016-03-01 DIAGNOSIS — I10 Essential (primary) hypertension: Secondary | ICD-10-CM

## 2016-03-01 DIAGNOSIS — R079 Chest pain, unspecified: Secondary | ICD-10-CM

## 2016-03-01 MED ORDER — SILDENAFIL CITRATE 100 MG PO TABS: ORAL_TABLET | ORAL | Status: DC

## 2016-03-01 NOTE — Patient Instructions (Signed)
Your physician wants you to follow-up in: 6 MONTHS WITH DR. BRANCH You will receive a reminder letter in the mail two months in advance. If you don't receive a letter, please call our office to schedule the follow-up appointment.  Your physician has recommended you make the following change in your medication:   START VIAGRA 100 MG TAKE 1/2 TAB 30 MINS PRIOR   Thank you for choosing [facility]!!

## 2016-03-01 NOTE — Progress Notes (Signed)
Patient ID: Christopher Cross, male   DOB: 1966-04-04, 50 y.o.   MRN: [id number]     Clinical Summary Christopher Cross is a 50 y.o.male seen today for follow up of the following medical problems.   1. HTN  - compliant with meds - he does not check regularly at home - last visit started chlorthalidone 12.5mg  daily, tolerating well without side effects.    2. History of chest pain  - 03/2014 stress MPI with no ischemia and low risk Duke score of 7.  - denies any recent symptoms since last visit  3. LE edema - LE edema resolved  since starting chlorthalidone at last visit    Past Medical History  Diagnosis Date  . Unspecified essential hypertension   . Other emphysema (HCC)   . Asthma      No Known Allergies   Current Outpatient Prescriptions  Medication Sig Dispense Refill  . albuterol (PROVENTIL HFA;VENTOLIN HFA) 108 (90 BASE) MCG/ACT inhaler Inhale 1-2 puffs into the lungs every 6 (six) hours as needed for wheezing or shortness of breath. 1 each 2  . amLODipine (NORVASC) 10 MG tablet Take 1 tablet (10 mg total) by mouth daily. 30 tablet 6  . aspirin EC 81 MG tablet Take 1 tablet (81 mg total) by mouth daily.    Marland Kitchen atenolol (TENORMIN) 50 MG tablet Take 1 tablet (50 mg total) by mouth daily. 30 tablet 6  . chlorthalidone (HYGROTON) 25 MG tablet Take 0.5 tablets (12.5 mg total) by mouth daily. 45 tablet 3  . nitroGLYCERIN (NITROSTAT) 0.4 MG SL tablet Place 0.4 mg under the tongue every 5 (five) minutes as needed for chest pain.    . ranitidine (ZANTAC) 150 MG tablet Take 1 tablet (150 mg total) by mouth 2 (two) times daily.     No current facility-administered medications for this visit.     No past surgical history on file.   No Known Allergies    Family History  Problem Relation Age of Onset  . Heart attack Other     Grandfather  . Heart attack Other     Uncle     Social History Christopher Cross reports that he has quit smoking. His smoking use included Cigarettes. He  started smoking about 2 months ago. He smoked 1.50 packs per day. He has never used smokeless tobacco. Christopher Cross has no alcohol history on file.   Review of Systems CONSTITUTIONAL: No weight loss, fever, chills, weakness or fatigue.  HEENT: Eyes: No visual loss, blurred vision, double vision or yellow sclerae.No hearing loss, sneezing, congestion, runny nose or sore throat.  SKIN: No rash or itching.  CARDIOVASCULAR: no chest pain, no palpitations.  RESPIRATORY: No shortness of breath, cough or sputum.  GASTROINTESTINAL: No anorexia, nausea, vomiting or diarrhea. No abdominal pain or blood.  GENITOURINARY: No burning on urination, no polyuria NEUROLOGICAL: No headache, dizziness, syncope, paralysis, ataxia, numbness or tingling in the extremities. No change in bowel or bladder control.  MUSCULOSKELETAL: No muscle, back pain, joint pain or stiffness.  LYMPHATICS: No enlarged nodes. No history of splenectomy.  PSYCHIATRIC: No history of depression or anxiety.  ENDOCRINOLOGIC: No reports of sweating, cold or heat intolerance. No polyuria or polydipsia.  Marland Kitchen   Physical Examination Filed Vitals:   03/01/16 1615  BP: 143/84  Pulse: 64   Filed Vitals:   03/01/16 1615  Height:  (1.753 m)  Weight: 211 lb 3.2 oz (95.8 kg)   Manual bp 140/80  Gen: resting comfortably,  no acute distress HEENT: no scleral icterus, pupils equal round and reactive, no palptable cervical adenopathy,  CV: RRR, no m/r/g no jvd Resp: mild bilateral wheezing GI: abdomen is soft, non-tender, non-distended, normal bowel sounds, no hepatosplenomegaly MSK: extremities are warm, no edema.  Skin: warm, no rash Neuro:  no focal deficits Psych: appropriate affect   Diagnostic Studies 06/2014 Echo Study Conclusions  - Left ventricle: The cavity size was at the upper limits of normal. Wall thickness was normal. Systolic function was normal. The estimated ejection fraction was in the range of 50% to 55%. Wall  motion was normal; there were no regional wall motion abnormalities. Doppler parameters are consistent with abnormal left ventricular relaxation (grade 1 diastolic dysfunction). - Aortic valve: Mildly calcified annulus. Trileaflet. There was no significant regurgitation. - Mitral valve: There was trivial regurgitation. - Right atrium: Central venous pressure (est): 3 mm Hg. - Atrial septum: No defect or patent foramen ovale was identified. - Tricuspid valve: There was physiologic regurgitation. - Pulmonary arteries: Systolic pressure could not be accurately estimated. - Pericardium, extracardiac: There was no pericardial effusion.  Impressions:  - Upper normal LV chamber size with LVEF 50-55%, grade 1 diastolic dysfunction. No major valvular abnormalities. Unable to assess PASP.  07/2014 MPI  IMPRESSION:  1. Negative exercise MPI for ischemia  2. Normal LV systolic function, LVEF 48%  3. Duke score of 7, consistent with low risk for major cardiac  events.  4. Very good exercise function capacity (120% of predicted based on  age and gender).    Assessment and Plan   1. HTN  - at goal, continue current meds  2. Chest pain  - previous normal stress test, no recurrent symptpoms - continue to follow  3. LE edema - resolved on chlorthalidone, continue current meds. Previous echo with normal LVEF and grade I diastolic dysfunction  4. Erectile dysfunction - given Rx for viagra. He has not used his NG at home is years, counseled not to mix with viagra   F/u 6 months  Antoine Poche, M.D.

## 2016-10-21 ENCOUNTER — Other Ambulatory Visit: Payer: Self-pay | Admitting: Cardiology

## 2017-01-07 ENCOUNTER — Other Ambulatory Visit: Payer: Self-pay | Admitting: Cardiology

## 2017-01-09 ENCOUNTER — Other Ambulatory Visit: Payer: Self-pay | Admitting: *Deleted

## 2017-01-09 MED ORDER — ATENOLOL 50 MG PO TABS: 50.0000 mg | ORAL_TABLET | Freq: Every day | ORAL | 6 refills | Status: DC

## 2017-01-09 MED ORDER — AMLODIPINE BESYLATE 10 MG PO TABS: 10.0000 mg | ORAL_TABLET | Freq: Every day | ORAL | 3 refills | Status: DC

## 2017-08-25 ENCOUNTER — Other Ambulatory Visit: Payer: Self-pay | Admitting: Cardiology

## 2017-10-06 DIAGNOSIS — Z23 Encounter for immunization: Secondary | ICD-10-CM | POA: Diagnosis not present

## 2017-10-22 ENCOUNTER — Emergency Department (HOSPITAL_COMMUNITY)
Admission: EM | Admit: 2017-10-22 | Discharge: 2017-10-22 | Disposition: A | Discharge status: Home / Self Care | Payer: BLUE CROSS/BLUE SHIELD | Attending: Emergency Medicine | Admitting: Emergency Medicine

## 2017-10-22 ENCOUNTER — Other Ambulatory Visit: Payer: Self-pay

## 2017-10-22 ENCOUNTER — Encounter (HOSPITAL_COMMUNITY): Payer: Self-pay

## 2017-10-22 DIAGNOSIS — R739 Hyperglycemia, unspecified: Secondary | ICD-10-CM

## 2017-10-22 DIAGNOSIS — I10 Essential (primary) hypertension: Secondary | ICD-10-CM | POA: Insufficient documentation

## 2017-10-22 DIAGNOSIS — E1165 Type 2 diabetes mellitus with hyperglycemia: Secondary | ICD-10-CM | POA: Diagnosis not present

## 2017-10-22 DIAGNOSIS — Z79899 Other long term (current) drug therapy: Secondary | ICD-10-CM | POA: Insufficient documentation

## 2017-10-22 DIAGNOSIS — R631 Polydipsia: Secondary | ICD-10-CM | POA: Diagnosis present

## 2017-10-22 DIAGNOSIS — J45909 Unspecified asthma, uncomplicated: Secondary | ICD-10-CM | POA: Diagnosis not present

## 2017-10-22 DIAGNOSIS — Z7982 Long term (current) use of aspirin: Secondary | ICD-10-CM | POA: Insufficient documentation

## 2017-10-22 DIAGNOSIS — Z7984 Long term (current) use of oral hypoglycemic drugs: Secondary | ICD-10-CM | POA: Diagnosis not present

## 2017-10-22 DIAGNOSIS — F17210 Nicotine dependence, cigarettes, uncomplicated: Secondary | ICD-10-CM | POA: Diagnosis not present

## 2017-10-22 HISTORY — DX: Type 2 diabetes mellitus without complications: E11.9

## 2017-10-22 LAB — CBC WITH DIFFERENTIAL/PLATELET
BASOS PCT: 1 %
Basophils Absolute: 0.1 10*3/uL (ref 0.0–0.1)
EOS ABS: 0.3 10*3/uL (ref 0.0–0.7)
Eosinophils Relative: 3 %
HCT: 43.8 % (ref 39.0–52.0)
HEMOGLOBIN: 16.4 g/dL (ref 13.0–17.0)
Lymphocytes Relative: 31 %
Lymphs Abs: 2.6 10*3/uL (ref 0.7–4.0)
MCH: 32.9 pg (ref 26.0–34.0)
MCHC: 37.4 g/dL — AB (ref 30.0–36.0)
MCV: 87.8 fL (ref 78.0–100.0)
MONO ABS: 0.5 10*3/uL (ref 0.1–1.0)
Monocytes Relative: 6 %
NEUTROS PCT: 59 %
Neutro Abs: 5 10*3/uL (ref 1.7–7.7)
Platelets: 172 10*3/uL (ref 150–400)
RBC: 4.99 MIL/uL (ref 4.22–5.81)
RDW: 11.8 % (ref 11.5–15.5)
WBC: 8.5 10*3/uL (ref 4.0–10.5)

## 2017-10-22 LAB — COMPREHENSIVE METABOLIC PANEL
ALBUMIN: 4.1 g/dL (ref 3.5–5.0)
ALT: 129 U/L — ABNORMAL HIGH (ref 17–63)
AST: 56 U/L — ABNORMAL HIGH (ref 15–41)
Alkaline Phosphatase: 86 U/L (ref 38–126)
Anion gap: 13 (ref 5–15)
BUN: 10 mg/dL (ref 6–20)
CO2: 21 mmol/L — AB (ref 22–32)
Calcium: 9.1 mg/dL (ref 8.9–10.3)
Chloride: 97 mmol/L — ABNORMAL LOW (ref 101–111)
Creatinine, Ser: 0.79 mg/dL (ref 0.61–1.24)
GFR calc Af Amer: >60 mL/min (ref >60)
GFR calc non Af Amer: >60 mL/min (ref >60)
Glucose, Bld: 417 mg/dL — ABNORMAL HIGH (ref 65–99)
Potassium: 3.6 mmol/L (ref 3.5–5.1)
SODIUM: 131 mmol/L — AB (ref 135–145)
Total Bilirubin: 1 mg/dL (ref 0.3–1.2)
Total Protein: 7.1 g/dL (ref 6.5–8.1)

## 2017-10-22 LAB — CBG MONITORING, ED
Glucose-Capillary: 494 mg/dL — ABNORMAL HIGH (ref 65–99)
Glucose-Capillary: 318 mg/dL — ABNORMAL HIGH (ref 65–99)

## 2017-10-22 LAB — URINALYSIS, ROUTINE W REFLEX MICROSCOPIC
BACTERIA UA: NONE SEEN
Bilirubin Urine: NEGATIVE
Glucose, UA: >=500 mg/dL — AB
Hgb urine dipstick: NEGATIVE
KETONES UR: 20 mg/dL — AB
LEUKOCYTES UA: NEGATIVE
Nitrite: NEGATIVE
PROTEIN: NEGATIVE mg/dL
Specific Gravity, Urine: 1.03 (ref 1.005–1.030)
Squamous Epithelial / LPF: NONE SEEN
pH: 5 (ref 5.0–8.0)

## 2017-10-22 MED ORDER — NICOTINE 21 MG/24HR TD PT24
21.0000 mg | MEDICATED_PATCH | Freq: Once | TRANSDERMAL | Status: DC
  Administered 2017-10-22: 21 mg via TRANSDERMAL
  Filled 2017-10-22: qty 1

## 2017-10-22 MED ORDER — METFORMIN HCL 1000 MG PO TABS
1000.0000 mg | ORAL_TABLET | Freq: Two times a day (BID) | ORAL | 0 refills | Status: AC
Start: 2017-10-22 — End: ?

## 2017-10-22 MED ORDER — SODIUM CHLORIDE 0.9 % IV SOLN
Freq: Once | INTRAVENOUS | Status: AC
  Administered 2017-10-22: 10:00:00 via INTRAVENOUS

## 2017-10-22 MED ORDER — INSULIN ASPART 100 UNIT/ML ~~LOC~~ SOLN
6.0000 [IU] | Freq: Once | SUBCUTANEOUS | Status: AC
  Administered 2017-10-22: 6 [IU] via SUBCUTANEOUS
  Filled 2017-10-22: qty 1

## 2017-10-22 MED ORDER — SODIUM CHLORIDE 0.9 % IV BOLUS (SEPSIS)
1000.0000 mL | Freq: Once | INTRAVENOUS | Status: AC
  Administered 2017-10-22: 1000 mL via INTRAVENOUS

## 2017-10-22 NOTE — ED Triage Notes (Signed)
Reports of blurred vision and frequent urination x1 week. States blood sugar has been over 300 x1 week. Not previously known diabetic per patient.

## 2017-10-22 NOTE — Discharge Instructions (Signed)
Her blood sugar is high today. This indicates to 2 diabetes.  You're being prescribed metformin. This is a medicine to prevent high blood sugars. Avoid simple sugars. Your blood tests indicate high cholesterol as well. He will likely need medication for this. Avoid fatty foods and red meats. Follow-up with Dr. Felecia Shelling, or a physician of your choice regarding ongoing treatment, and diabetic teaching.

## 2017-10-22 NOTE — ED Provider Notes (Signed)
[facility] EMERGENCY DEPARTMENT Provider Note   CSN: [id number] Arrival date & time: 10/22/17  4098     History   Chief Complaint Chief Complaint  Patient presents with  . Hyperglycemia    HPI Christopher Cross is a 51 y.o. male. Complaint is blurry vision, thirsty, high blood sugar.  HPI:  51 year old male. Has had 2-3 weeks of polyuria, polydipsia, thirsty, and no blurry vision for a few days. Has not been lightheaded or dizzy. No abdominal pain or vomiting. No history of hypertension. A family member has checked his sugar a few times and found it between 305 100.  Past Medical History:  Diagnosis Date  . Asthma   . Diabetes mellitus without complication (HCC)   . Other emphysema (HCC)   . Unspecified essential hypertension     Patient Active Problem List   Diagnosis Date Noted  . Chest pain 07/15/2014  . HTN (hypertension) 07/15/2014    History reviewed. No pertinent surgical history.     Home Medications    Prior to Admission medications   Medication Sig Start Date End Date Taking? Authorizing Provider  acetaminophen (TYLENOL) 500 MG tablet Take 1,000 mg every 6 (six) hours as needed by mouth for headache.   Yes [provider]  amLODipine (NORVASC) 10 MG tablet TAKE 1 TABLET BY MOUTH DAILY. 08/25/17  Yes BranchDorothe Pea, MD  aspirin EC 81 MG tablet Take 1 tablet (81 mg total) by mouth daily. 08/15/14  Yes BranchDorothe Pea, MD  Aspirin-Salicylamide-Caffeine (BC HEADACHE POWDER PO) Take 1 packet daily as needed by mouth (headache).   Yes [provider]  atenolol (TENORMIN) 50 MG tablet Take 1 tablet (50 mg total) by mouth daily. 01/09/17  Yes Branch, Dorothe Pea, MD  calcium carbonate (TUMS - DOSED IN MG ELEMENTAL CALCIUM) 500 MG chewable tablet Chew 2 tablets daily as needed by mouth for indigestion or heartburn.   Yes [provider]  chlorthalidone (HYGROTON) 25 MG tablet TAKE 1/2 TABLET BY MOUTH EVERY DAY 01/09/17  Yes Branch, Dorothe Pea, MD  ibuprofen (ADVIL,MOTRIN) 200 MG tablet Take 400 mg every 6 (six) hours as needed by mouth for headache.   Yes [provider]  Multiple Vitamin (MULTIVITAMIN WITH MINERALS) TABS tablet Take 1 tablet daily by mouth.   Yes [provider]  nitroGLYCERIN (NITROSTAT) 0.4 MG SL tablet Place 0.4 mg under the tongue every 5 (five) minutes as needed for chest pain.   Yes [provider]  PROAIR HFA 108 (90 Base) MCG/ACT inhaler INHALE 1 TO 2 PUFFS EVERY SIX HOURS AS NEEDED FOR WHEEZING OR SHORTNESS OF BREATH 10/24/16  Yes Branch, Dorothe Pea, MD  ranitidine (ZANTAC) 150 MG tablet Take 1 tablet (150 mg total) by mouth 2 (two) times daily. Patient taking differently: Take 300 mg 2 (two) times daily by mouth.  08/15/14  Yes Branch, Dorothe Pea, MD  sildenafil (VIAGRA) 100 MG tablet TAKE 1/2 TAB 30 MINS PRIOR 03/01/16  Yes Branch, Dorothe Pea, MD  metFORMIN (GLUCOPHAGE) 1000 MG tablet Take 1 tablet (1,000 mg total) 2 (two) times daily by mouth. 10/22/17   Rolland Porter, MD    Family History Family History  Problem Relation Age of Onset  . Heart attack Other        Grandfather  . Heart attack Other        Uncle    Social History Social History   Tobacco Use  . Smoking status: Current Every Day Smoker  Packs/day: 2.00    Types: Cigarettes    Start date: 12/21/2015  . Smokeless tobacco: Never Used  . Tobacco comment: started back after about 15 days   Substance Use Topics  . Alcohol use: Yes    Alcohol/week: 0.0 oz    Comment: daily  . Drug use: No     Allergies   Patient has no known allergies.   Review of Systems Review of Systems  Constitutional: Negative for appetite change, chills, diaphoresis, fatigue and fever.  HENT: Negative for mouth sores, sore throat and trouble swallowing.   Eyes: Negative for visual disturbance.  Respiratory: Negative for cough, chest tightness, shortness of breath and wheezing.   Cardiovascular: Negative for chest pain.    Gastrointestinal: Negative for abdominal distention, abdominal pain, diarrhea, nausea and vomiting.  Endocrine: Positive for polydipsia, polyphagia and polyuria.  Genitourinary: Negative for dysuria, frequency and hematuria.  Musculoskeletal: Negative for gait problem.  Skin: Negative for color change, pallor and rash.  Neurological: Negative for dizziness, syncope, light-headedness and headaches.  Hematological: Does not bruise/bleed easily.  Psychiatric/Behavioral: Negative for behavioral problems and confusion.     Physical Exam Updated Vital Signs BP (!) 162/101 (BP Location: Left Arm)   Pulse 74   Temp 97.6 F (36.4 C) (Oral)   Resp 18   Ht 5\' 9"  (1.753 m)   Wt 93.1 kg (205 lb 5 oz)   SpO2 95%   BMI 30.32 kg/m   Physical Exam  Constitutional: He is oriented to person, place, and time. He appears well-developed and well-nourished. No distress.  HENT:  Head: Normocephalic.  Eyes: Conjunctivae are normal. Pupils are equal, round, and reactive to light. No scleral icterus.  Normal. So the eyes and externa. Normal-appearing corneas. Clear anterior chambers. Normal redness.  Neck: Normal range of motion. Neck supple. No thyromegaly present.  Cardiovascular: Normal rate and regular rhythm. Exam reveals no gallop and no friction rub.  No murmur heard. Pulmonary/Chest: Effort normal and breath sounds normal. No respiratory distress. He has no wheezes. He has no rales.  Abdominal: Soft. Bowel sounds are normal. He exhibits no distension. There is no tenderness. There is no rebound.  Musculoskeletal: Normal range of motion.  Neurological: He is alert and oriented to person, place, and time.  Skin: Skin is warm and dry. No rash noted.  Psychiatric: He has a normal mood and affect. His behavior is normal.     ED Treatments / Results  Labs (all labs ordered are listed, but only abnormal results are displayed) Labs Reviewed  CBC WITH DIFFERENTIAL/PLATELET - Abnormal; Notable for  the following components:      Result Value   MCHC 37.4 (*)    All other components within normal limits  COMPREHENSIVE METABOLIC PANEL - Abnormal; Notable for the following components:   Sodium 131 (*)    Chloride 97 (*)    CO2 21 (*)    Glucose, Bld 417 (*)    AST 56 (*)    ALT 129 (*)    All other components within normal limits  URINALYSIS, ROUTINE W REFLEX MICROSCOPIC - Abnormal; Notable for the following components:   Glucose, UA >=500 (*)    Ketones, ur 20 (*)    All other components within normal limits  CBG MONITORING, ED - Abnormal; Notable for the following components:   Glucose-Capillary 494 (*)    All other components within normal limits  CBG MONITORING, ED - Abnormal; Notable for the following components:   Glucose-Capillary 318 (*)  All other components within normal limits    EKG  EKG Interpretation None       Radiology No results found.  Procedures Procedures (including critical care time)  Medications Ordered in ED Medications  nicotine (NICODERM CQ - dosed in mg/24 hours) patch 21 mg (21 mg Transdermal Patch Applied 10/22/17 0927)  sodium chloride 0.9 % bolus 1,000 mL (0 mLs Intravenous Stopped 10/22/17 1107)  0.9 %  sodium chloride infusion ( Intravenous New Bag/Given 10/22/17 0946)  insulin aspart (novoLOG) injection 6 Units (6 Units Subcutaneous Given 10/22/17 1224)     Initial Impression / Assessment and Plan / ED Course  I have reviewed the triage vital signs and the nursing notes.  Pertinent labs & imaging results that were available during my care of the patient were reviewed by me and considered in my medical decision making (see chart for details).   hyperglycemic without anion gap or acidosis. Given fluids and insulin. His blood sugar has improved. Plan will be twice a day metformin. Primary care follow-up. His blood was noted to light. Neck. It made him aware of this. He will need fasting lipid panels and likely treatment. He requests  local primary care was given this and referral. He is appropriate for follow-up. Prescribed metformin 1 g twice a day.  Final Clinical Impressions(s) / ED Diagnoses   Final diagnoses:  Hyperglycemia    New Prescriptions This SmartLink is deprecated. Use AVSMEDLIST instead to display the medication list for a patient.   Rolland Porter, MD 10/22/17 1235

## 2017-10-22 NOTE — ED Notes (Signed)
Pt ambulated to BR and back to room without difficultly  

## 2017-10-22 NOTE — ED Notes (Signed)
Pt states he does not need to urinate at this time, aware of DO  

## 2017-10-25 DIAGNOSIS — E119 Type 2 diabetes mellitus without complications: Secondary | ICD-10-CM | POA: Diagnosis not present

## 2017-10-25 DIAGNOSIS — I10 Essential (primary) hypertension: Secondary | ICD-10-CM | POA: Diagnosis not present

## 2017-10-25 DIAGNOSIS — J449 Chronic obstructive pulmonary disease, unspecified: Secondary | ICD-10-CM | POA: Diagnosis not present

## 2017-10-25 DIAGNOSIS — E1165 Type 2 diabetes mellitus with hyperglycemia: Secondary | ICD-10-CM | POA: Diagnosis not present

## 2017-10-25 DIAGNOSIS — F17200 Nicotine dependence, unspecified, uncomplicated: Secondary | ICD-10-CM | POA: Diagnosis not present

## 2017-11-01 DIAGNOSIS — I10 Essential (primary) hypertension: Secondary | ICD-10-CM | POA: Diagnosis not present

## 2017-11-01 DIAGNOSIS — E785 Hyperlipidemia, unspecified: Secondary | ICD-10-CM | POA: Diagnosis not present

## 2017-11-01 DIAGNOSIS — E1165 Type 2 diabetes mellitus with hyperglycemia: Secondary | ICD-10-CM | POA: Diagnosis not present

## 2017-11-01 DIAGNOSIS — J449 Chronic obstructive pulmonary disease, unspecified: Secondary | ICD-10-CM | POA: Diagnosis not present

## 2017-11-11 ENCOUNTER — Other Ambulatory Visit: Payer: Self-pay | Admitting: Cardiology

## 2017-11-23 ENCOUNTER — Other Ambulatory Visit: Payer: Self-pay | Admitting: *Deleted

## 2017-11-23 MED ORDER — AMLODIPINE BESYLATE 10 MG PO TABS: 10.0000 mg | ORAL_TABLET | Freq: Every day | ORAL | 0 refills | Status: DC

## 2018-01-01 DIAGNOSIS — E119 Type 2 diabetes mellitus without complications: Secondary | ICD-10-CM | POA: Diagnosis not present

## 2018-01-01 DIAGNOSIS — F17200 Nicotine dependence, unspecified, uncomplicated: Secondary | ICD-10-CM | POA: Diagnosis not present

## 2018-01-01 DIAGNOSIS — E1165 Type 2 diabetes mellitus with hyperglycemia: Secondary | ICD-10-CM | POA: Diagnosis not present

## 2018-01-01 DIAGNOSIS — J449 Chronic obstructive pulmonary disease, unspecified: Secondary | ICD-10-CM | POA: Diagnosis not present

## 2018-01-01 DIAGNOSIS — I10 Essential (primary) hypertension: Secondary | ICD-10-CM | POA: Diagnosis not present

## 2018-01-23 DIAGNOSIS — Z0001 Encounter for general adult medical examination with abnormal findings: Secondary | ICD-10-CM | POA: Diagnosis not present

## 2018-01-23 DIAGNOSIS — I10 Essential (primary) hypertension: Secondary | ICD-10-CM | POA: Diagnosis not present

## 2018-01-23 DIAGNOSIS — E1165 Type 2 diabetes mellitus with hyperglycemia: Secondary | ICD-10-CM | POA: Diagnosis not present

## 2018-01-23 DIAGNOSIS — J449 Chronic obstructive pulmonary disease, unspecified: Secondary | ICD-10-CM | POA: Diagnosis not present

## 2018-01-23 DIAGNOSIS — F17210 Nicotine dependence, cigarettes, uncomplicated: Secondary | ICD-10-CM | POA: Diagnosis not present

## 2018-05-10 DIAGNOSIS — H524 Presbyopia: Secondary | ICD-10-CM | POA: Diagnosis not present

## 2018-05-10 DIAGNOSIS — Z794 Long term (current) use of insulin: Secondary | ICD-10-CM | POA: Diagnosis not present

## 2018-05-10 DIAGNOSIS — H5213 Myopia, bilateral: Secondary | ICD-10-CM | POA: Diagnosis not present

## 2018-05-10 DIAGNOSIS — E119 Type 2 diabetes mellitus without complications: Secondary | ICD-10-CM | POA: Diagnosis not present

## 2018-05-10 DIAGNOSIS — H52203 Unspecified astigmatism, bilateral: Secondary | ICD-10-CM | POA: Diagnosis not present

## 2018-05-10 DIAGNOSIS — Z7984 Long term (current) use of oral hypoglycemic drugs: Secondary | ICD-10-CM | POA: Diagnosis not present

## 2019-01-03 DIAGNOSIS — Z23 Encounter for immunization: Secondary | ICD-10-CM | POA: Diagnosis not present

## 2019-01-03 DIAGNOSIS — J449 Chronic obstructive pulmonary disease, unspecified: Secondary | ICD-10-CM | POA: Diagnosis not present

## 2019-01-03 DIAGNOSIS — I10 Essential (primary) hypertension: Secondary | ICD-10-CM | POA: Diagnosis not present

## 2019-01-03 DIAGNOSIS — E11620 Type 2 diabetes mellitus with diabetic dermatitis: Secondary | ICD-10-CM | POA: Diagnosis not present

## 2019-01-03 DIAGNOSIS — L989 Disorder of the skin and subcutaneous tissue, unspecified: Secondary | ICD-10-CM | POA: Diagnosis not present

## 2019-01-03 DIAGNOSIS — E119 Type 2 diabetes mellitus without complications: Secondary | ICD-10-CM | POA: Diagnosis not present

## 2020-07-21 ENCOUNTER — Emergency Department (HOSPITAL_COMMUNITY)
Admission: EM | Admit: 2020-07-21 | Discharge: 2020-07-22 | Disposition: A | Discharge status: Home / Self Care | Payer: 59 | Attending: Emergency Medicine | Admitting: Emergency Medicine

## 2020-07-21 DIAGNOSIS — E119 Type 2 diabetes mellitus without complications: Secondary | ICD-10-CM | POA: Diagnosis not present

## 2020-07-21 DIAGNOSIS — J45909 Unspecified asthma, uncomplicated: Secondary | ICD-10-CM | POA: Insufficient documentation

## 2020-07-21 DIAGNOSIS — N201 Calculus of ureter: Secondary | ICD-10-CM | POA: Diagnosis not present

## 2020-07-21 DIAGNOSIS — R300 Dysuria: Secondary | ICD-10-CM | POA: Insufficient documentation

## 2020-07-21 DIAGNOSIS — Z794 Long term (current) use of insulin: Secondary | ICD-10-CM | POA: Insufficient documentation

## 2020-07-21 DIAGNOSIS — R39198 Other difficulties with micturition: Secondary | ICD-10-CM | POA: Insufficient documentation

## 2020-07-21 DIAGNOSIS — Z7982 Long term (current) use of aspirin: Secondary | ICD-10-CM | POA: Insufficient documentation

## 2020-07-21 DIAGNOSIS — R319 Hematuria, unspecified: Secondary | ICD-10-CM | POA: Insufficient documentation

## 2020-07-21 DIAGNOSIS — R109 Unspecified abdominal pain: Secondary | ICD-10-CM | POA: Diagnosis present

## 2020-07-21 DIAGNOSIS — Z79899 Other long term (current) drug therapy: Secondary | ICD-10-CM | POA: Insufficient documentation

## 2020-07-21 DIAGNOSIS — I10 Essential (primary) hypertension: Secondary | ICD-10-CM | POA: Diagnosis not present

## 2020-07-21 DIAGNOSIS — F17210 Nicotine dependence, cigarettes, uncomplicated: Secondary | ICD-10-CM | POA: Insufficient documentation

## 2020-07-21 DIAGNOSIS — R61 Generalized hyperhidrosis: Secondary | ICD-10-CM | POA: Diagnosis not present

## 2020-07-21 LAB — URINALYSIS, ROUTINE W REFLEX MICROSCOPIC
Bacteria, UA: NONE SEEN
Bilirubin Urine: NEGATIVE
Glucose, UA: >=500 mg/dL — AB
Ketones, ur: NEGATIVE mg/dL
Leukocytes,Ua: NEGATIVE
Nitrite: NEGATIVE
Protein, ur: 30 mg/dL — AB
RBC / HPF: >50 RBC/hpf — ABNORMAL HIGH (ref 0–5)
Specific Gravity, Urine: 1.02 (ref 1.005–1.030)
pH: 6 (ref 5.0–8.0)

## 2020-07-21 LAB — COMPREHENSIVE METABOLIC PANEL
ALT: 40 U/L (ref 0–44)
AST: 25 U/L (ref 15–41)
Albumin: 4 g/dL (ref 3.5–5.0)
Alkaline Phosphatase: 62 U/L (ref 38–126)
Anion gap: 14 (ref 5–15)
BUN: 8 mg/dL (ref 6–20)
CO2: 24 mmol/L (ref 22–32)
Calcium: 9.1 mg/dL (ref 8.9–10.3)
Chloride: 96 mmol/L — ABNORMAL LOW (ref 98–111)
Creatinine, Ser: 1.14 mg/dL (ref 0.61–1.24)
GFR calc Af Amer: >60 mL/min (ref >60)
GFR calc non Af Amer: >60 mL/min (ref >60)
Glucose, Bld: 233 mg/dL — ABNORMAL HIGH (ref 70–99)
Potassium: 3.3 mmol/L — ABNORMAL LOW (ref 3.5–5.1)
Sodium: 134 mmol/L — ABNORMAL LOW (ref 135–145)
Total Bilirubin: 0.9 mg/dL (ref 0.3–1.2)
Total Protein: 7.2 g/dL (ref 6.5–8.1)

## 2020-07-21 LAB — CBC
HCT: 47.2 % (ref 39.0–52.0)
Hemoglobin: 16.2 g/dL (ref 13.0–17.0)
MCH: 31.6 pg (ref 26.0–34.0)
MCHC: 34.3 g/dL (ref 30.0–36.0)
MCV: 92.2 fL (ref 80.0–100.0)
Platelets: 237 10*3/uL (ref 150–400)
RBC: 5.12 MIL/uL (ref 4.22–5.81)
RDW: 11.6 % (ref 11.5–15.5)
WBC: 16.4 10*3/uL — ABNORMAL HIGH (ref 4.0–10.5)
nRBC: 0 % (ref 0.0–0.2)

## 2020-07-21 LAB — LIPASE, BLOOD: Lipase: 24 U/L (ref 11–51)

## 2020-07-21 MED ORDER — SODIUM CHLORIDE 0.9% FLUSH: 3.0000 mL | Freq: Once | INTRAVENOUS | Status: DC

## 2020-07-21 NOTE — ED Triage Notes (Signed)
Patient reports RLQ pain with radiation into R flank onset yesterday. History of kidney stones.

## 2020-07-22 ENCOUNTER — Emergency Department (HOSPITAL_COMMUNITY): Payer: 59

## 2020-07-22 MED ORDER — KETOROLAC TROMETHAMINE 15 MG/ML IJ SOLN
15.0000 mg | Freq: Once | INTRAMUSCULAR | Status: AC
  Administered 2020-07-22: 15 mg via INTRAMUSCULAR
  Filled 2020-07-22: qty 1

## 2020-07-22 MED ORDER — ONDANSETRON 4 MG PO TBDP
4.0000 mg | ORAL_TABLET | Freq: Once | ORAL | Status: AC
  Administered 2020-07-22: 4 mg via ORAL
  Filled 2020-07-22: qty 1

## 2020-07-22 MED ORDER — ONDANSETRON 4 MG PO TBDP: 4.0000 mg | ORAL_TABLET | Freq: Three times a day (TID) | ORAL | 0 refills | Status: DC | PRN

## 2020-07-22 MED ORDER — OXYCODONE-ACETAMINOPHEN 5-325 MG PO TABS: 2.0000 | ORAL_TABLET | Freq: Four times a day (QID) | ORAL | 0 refills | Status: DC | PRN

## 2020-07-22 NOTE — ED Provider Notes (Signed)
[facility] EMERGENCY DEPARTMENT Provider Note   CSN: [id number] Arrival date & time: 07/21/20  1902     History Chief Complaint  Patient presents with  . Flank Pain    Christopher Cross is a 54 y.o. male.   Flank Pain This is a new problem. The current episode started 12 to 24 hours ago. The problem occurs constantly. The problem has been gradually improving. Associated symptoms include abdominal pain. Pertinent negatives include no chest pain, no headaches and no shortness of breath. Nothing aggravates the symptoms. Nothing relieves the symptoms. He has tried nothing for the symptoms. The treatment provided moderate relief.       Past Medical History:  Diagnosis Date  . Asthma   . Diabetes mellitus without complication (HCC)   . Other emphysema (HCC)   . Unspecified essential hypertension     Patient Active Problem List   Diagnosis Date Noted  . Chest pain 07/15/2014  . HTN (hypertension) 07/15/2014    No past surgical history on file.     Family History  Problem Relation Age of Onset  . Heart attack Other        Grandfather  . Heart attack Other        Uncle    Social History   Tobacco Use  . Smoking status: Current Every Day Smoker    Packs/day: 2.00    Types: Cigarettes    Start date: 12/21/2015  . Smokeless tobacco: Never Used  . Tobacco comment: started back after about 15 days   Substance Use Topics  . Alcohol use: Yes    Alcohol/week: 0.0 standard drinks    Comment: daily  . Drug use: No    Home Medications Prior to Admission medications   Medication Sig Start Date End Date Taking? Authorizing Provider  acetaminophen (TYLENOL) 500 MG tablet Take 1,000 mg every 6 (six) hours as needed by mouth for headache.   Yes [provider]  amLODipine (NORVASC) 10 MG tablet Take 1 tablet (10 mg total) by mouth daily. Patient taking differently: Take 10 mg by mouth every evening.  11/23/17  Yes BranchDorothe Pea, MD  aspirin EC 81  MG tablet Take 1 tablet (81 mg total) by mouth daily. Patient taking differently: Take 81 mg by mouth every evening.  08/15/14  Yes Branch, Dorothe Pea, MD  Aspirin-Salicylamide-Caffeine [facility] HEADACHE POWDER PO) Take 1 packet daily as needed by mouth (headache).   Yes [provider]  atenolol (TENORMIN) 50 MG tablet TAKE 1 TABLET BY MOUTH DAILY. Patient taking differently: Take 50 mg by mouth every evening.  11/13/17  Yes BranchDorothe Pea, MD  atorvastatin (LIPITOR) 40 MG tablet Take 40 mg by mouth every evening.  07/02/20  Yes [provider]  calcium carbonate (TUMS - DOSED IN MG ELEMENTAL CALCIUM) 500 MG chewable tablet Chew 2 tablets daily as needed by mouth for indigestion or heartburn.   Yes [provider]  chlorthalidone (HYGROTON) 25 MG tablet TAKE 1/2 TABLET BY MOUTH EVERY DAY Patient taking differently: Take 12.5 mg by mouth every evening.  01/09/17  Yes Branch, Dorothe Pea, MD  ibuprofen (ADVIL,MOTRIN) 200 MG tablet Take 400 mg every 6 (six) hours as needed by mouth for headache.   Yes [provider]  Insulin Glargine (BASAGLAR KWIKPEN) 100 UNIT/ML Inject 30 Units into the skin at bedtime. 06/26/20  Yes [provider]  losartan (COZAAR) 25 MG tablet Take 25 mg by mouth every evening.  06/01/20  Yes [provider]  metFORMIN (GLUCOPHAGE) 1000 MG tablet Take 1 tablet (1,000 mg total) 2 (two) times daily by mouth. Patient taking differently: Take 2,000 mg by mouth every evening.  10/22/17  Yes Rolland Porter, MD  Multiple Vitamin (MULTIVITAMIN WITH MINERALS) TABS tablet Take 1 tablet by mouth every evening.    Yes [provider]  nitroGLYCERIN (NITROSTAT) 0.4 MG SL tablet Place 0.4 mg under the tongue every 5 (five) minutes as needed for chest pain.   Yes [provider]  PROAIR HFA 108 (90 Base) MCG/ACT inhaler INHALE 1 TO 2 PUFFS EVERY SIX HOURS AS NEEDED FOR WHEEZING OR SHORTNESS OF BREATH Patient taking differently:  Inhale 1-2 puffs into the lungs every 6 (six) hours as needed for wheezing or shortness of breath.  10/24/16  Yes Branch, Dorothe Pea, MD  sildenafil (VIAGRA) 100 MG tablet TAKE 1/2 TAB 30 MINS PRIOR Patient taking differently: Take 50 mg by mouth as needed for erectile dysfunction.  03/01/16  Yes Branch, Dorothe Pea, MD  sitaGLIPtin (JANUVIA) 100 MG tablet Take 100 mg by mouth every evening.  04/16/18  Yes [provider]  SPIRIVA HANDIHALER 18 MCG inhalation capsule Place 1 capsule into inhaler and inhale daily. 06/01/20  Yes [provider]  ondansetron (ZOFRAN ODT) 4 MG disintegrating tablet Take 1 tablet (4 mg total) by mouth every 8 (eight) hours as needed for up to 10 doses for nausea or vomiting. 07/22/20   Sabino Donovan, MD  oxyCODONE-acetaminophen (PERCOCET/ROXICET) 5-325 MG tablet Take 2 tablets by mouth every 6 (six) hours as needed for up to 12 doses for severe pain. 07/22/20   Sabino Donovan, MD    Allergies    Patient has no known allergies.  Review of Systems   Review of Systems  Constitutional: Positive for diaphoresis. Negative for chills and fever.  HENT: Negative for congestion and rhinorrhea.   Respiratory: Negative for cough and shortness of breath.   Cardiovascular: Negative for chest pain and palpitations.  Gastrointestinal: Positive for abdominal pain, nausea and vomiting. Negative for diarrhea.  Genitourinary: Positive for difficulty urinating, dysuria, flank pain and hematuria. Negative for penile pain, scrotal swelling and testicular pain.  Musculoskeletal: Negative for arthralgias and back pain.  Skin: Negative for color change and rash.  Neurological: Negative for light-headedness and headaches.    Physical Exam Updated Vital Signs BP (!) 148/99 (BP Location: Right Arm)   Pulse 67   Temp 97.8 F (36.6 C) (Oral)   Resp 18   Ht 5\' 9"  (1.753 m)   Wt 90.7 kg   SpO2 95%   BMI 29.53 kg/m   Physical Exam Vitals and nursing note reviewed.    Constitutional:      General: He is not in acute distress.    Appearance: Normal appearance.  HENT:     Head: Normocephalic and atraumatic.     Nose: No rhinorrhea.  Eyes:     General:        Right eye: No discharge.        Left eye: No discharge.     Conjunctiva/sclera: Conjunctivae normal.  Cardiovascular:     Rate and Rhythm: Normal rate and regular rhythm.  Pulmonary:     Effort: Pulmonary effort is normal.     Breath sounds: No stridor.  Abdominal:     General: Abdomen is flat. There is no distension.     Palpations: Abdomen is soft.     Tenderness: There is no abdominal tenderness.  There is no right CVA tenderness or left CVA tenderness.  Musculoskeletal:        General: No deformity or signs of injury.  Skin:    General: Skin is warm and dry.  Neurological:     General: No focal deficit present.     Mental Status: He is alert. Mental status is at baseline.     Motor: No weakness.  Psychiatric:        Mood and Affect: Mood normal.        Behavior: Behavior normal.        Thought Content: Thought content normal.     ED Results / Procedures / Treatments   Labs (all labs ordered are listed, but only abnormal results are displayed) Labs Reviewed  COMPREHENSIVE METABOLIC PANEL - Abnormal; Notable for the following components:      Result Value   Sodium 134 (*)    Potassium 3.3 (*)    Chloride 96 (*)    Glucose, Bld 233 (*)    All other components within normal limits  CBC - Abnormal; Notable for the following components:   WBC 16.4 (*)    All other components within normal limits  URINALYSIS, ROUTINE W REFLEX MICROSCOPIC - Abnormal; Notable for the following components:   APPearance HAZY (*)    Glucose, UA >=500 (*)    Hgb urine dipstick LARGE (*)    Protein, ur 30 (*)    RBC / HPF >50 (*)    All other components within normal limits  LIPASE, BLOOD    EKG None  Radiology CT Renal Stone Study  Result Date: 07/22/2020 CLINICAL DATA:  Right-sided flank  pain radiating into the right lower quadrant. EXAM: CT ABDOMEN AND PELVIS WITHOUT CONTRAST TECHNIQUE: Multidetector CT imaging of the abdomen and pelvis was performed following the standard protocol without IV contrast. COMPARISON:  None FINDINGS: Lower chest: Mild scarring is noted in the bases bilaterally. No acute infiltrate is seen. Hepatobiliary: Gallbladder is within normal limits. Liver is diffusely fatty infiltrated. Pancreas: Unremarkable. No pancreatic ductal dilatation or surrounding inflammatory changes. Spleen: Normal in size without focal abnormality. Adrenals/Urinary Tract: Adrenal glands are unremarkable. The left kidney shows no renal calculi or obstructive changes. Right kidney demonstrates significant hydronephrosis as well as a 7-8 mm lower pole stone without obstructive change. The ureters mildly prominent secondary to a distal ureteral stone which measures 6 mm. The bladder is partially distended. Stomach/Bowel: Appendix is well visualized and within normal limits. No obstructive or inflammatory changes of the colon are seen. Mild diverticular changes noted. The small bowel and stomach are within normal limits. Vascular/Lymphatic: Aortic atherosclerosis. No enlarged abdominal or pelvic lymph nodes. Reproductive: Prostate is unremarkable. Other: Fat containing right inguinal hernia is noted. No ascites is identified. Musculoskeletal: No acute bony abnormality is noted. IMPRESSION: Right hydronephrosis and hydroureter secondary to a 6 mm distal right ureteral stone. Nonobstructing 7-8 mm lower pole stone is noted on the right. Fatty liver. Normal-appearing appendix. Fat containing right inguinal hernia. Electronically Signed   By: Alcide Clever M.D.   On: 07/22/2020 10:55    Procedures Procedures (including critical care time)  Medications Ordered in ED Medications  sodium chloride flush (NS) 0.9 % injection 3 mL (has no administration in time range)  ketorolac (TORADOL) 15 MG/ML  injection 15 mg (15 mg Intramuscular Given 07/22/20 1211)  ondansetron (ZOFRAN-ODT) disintegrating tablet 4 mg (4 mg Oral Given 07/22/20 1211)    ED Course  I have reviewed the triage  vital signs and the nursing notes.  Pertinent labs & imaging results that were available during my care of the patient were reviewed by me and considered in my medical decision making (see chart for details).    MDM Rules/Calculators/A&P                          54 year old male comes with right flank pain similar stones he had years ago.  Was nauseated diaphoretic and vomited when the pain started.  Difficulty urinating but is improved now.  Feels better but still has mild pain and nausea.  Will get CT stone study.  Urinalysis shows blood but no signs of infection, labs were reviewed by myself, CBC with mild leukocytosis, CMP and lipase without any significantly concerning findings previous however that was over 2 years ago.  Other than his acute episode he has been urinating normally.  We will get a CT stone study to evaluate for obstruction, if the stone study is unremarkable he will be discharged home.  If there is something significant he will require urgent consultation.  Pain medication antiemetics are given.  CT scan read by radiology myself shows significant hydronephrosis hydroureter.  I spoke with the urologist on-call Dr. Laverle Patter, who recommends outpatient pain control antiemetics hydration and follow-up with urology group as it is a 6 mm stone that would likely pass without acute intervention.  No signs of infection.  Safe for discharge home.  Strict return  Final Clinical Impression(s) / ED Diagnoses Final diagnoses:  Flank pain  Right ureteral stone    Rx / DC Orders ED Discharge Orders         Ordered    ondansetron (ZOFRAN ODT) 4 MG disintegrating tablet  Every 8 hours PRN     Discontinue  Reprint     07/22/20 1147    oxyCODONE-acetaminophen (PERCOCET/ROXICET) 5-325 MG tablet  Every 6 hours PRN      Discontinue  Reprint     07/22/20 1211           Sabino Donovan, MD 07/22/20 1220

## 2020-07-22 NOTE — ED Notes (Signed)
Patient verbalizes understanding of discharge instructions. Opportunity for questioning and answers were provided. Armband removed by staff, pt discharged from ED. Pt d/c by float RN.

## 2020-07-22 NOTE — ED Notes (Signed)
Pt not responding to roll call.  

## 2020-07-22 NOTE — Discharge Instructions (Signed)
You can take 600 mg of ibuprofen every 6 hours, you can take 1000 mg of Tylenol every 6 hours, you can alternate these every 3 or you can take them together.  

## 2020-11-10 ENCOUNTER — Emergency Department (HOSPITAL_COMMUNITY): Payer: Self-pay

## 2020-11-10 ENCOUNTER — Encounter (HOSPITAL_COMMUNITY): Payer: Self-pay | Admitting: *Deleted

## 2020-11-10 ENCOUNTER — Other Ambulatory Visit: Payer: Self-pay

## 2020-11-10 ENCOUNTER — Telehealth: Payer: Self-pay | Admitting: Internal Medicine

## 2020-11-10 ENCOUNTER — Inpatient Hospital Stay (HOSPITAL_COMMUNITY)
Admission: EM | Admit: 2020-11-10 | Discharge: 2020-11-15 | DRG: 286 | Disposition: A | Discharge status: Home / Self Care | Payer: Self-pay | Attending: Internal Medicine | Admitting: Internal Medicine

## 2020-11-10 DIAGNOSIS — I272 Pulmonary hypertension, unspecified: Secondary | ICD-10-CM | POA: Diagnosis present

## 2020-11-10 DIAGNOSIS — Z23 Encounter for immunization: Secondary | ICD-10-CM

## 2020-11-10 DIAGNOSIS — I110 Hypertensive heart disease with heart failure: Principal | ICD-10-CM | POA: Diagnosis present

## 2020-11-10 DIAGNOSIS — Z560 Unemployment, unspecified: Secondary | ICD-10-CM

## 2020-11-10 DIAGNOSIS — R9431 Abnormal electrocardiogram [ECG] [EKG]: Secondary | ICD-10-CM | POA: Diagnosis present

## 2020-11-10 DIAGNOSIS — K760 Fatty (change of) liver, not elsewhere classified: Secondary | ICD-10-CM | POA: Diagnosis present

## 2020-11-10 DIAGNOSIS — Z794 Long term (current) use of insulin: Secondary | ICD-10-CM

## 2020-11-10 DIAGNOSIS — E785 Hyperlipidemia, unspecified: Secondary | ICD-10-CM | POA: Diagnosis present

## 2020-11-10 DIAGNOSIS — R632 Polyphagia: Secondary | ICD-10-CM | POA: Diagnosis present

## 2020-11-10 DIAGNOSIS — E871 Hypo-osmolality and hyponatremia: Secondary | ICD-10-CM | POA: Diagnosis present

## 2020-11-10 DIAGNOSIS — H538 Other visual disturbances: Secondary | ICD-10-CM | POA: Diagnosis present

## 2020-11-10 DIAGNOSIS — R7989 Other specified abnormal findings of blood chemistry: Secondary | ICD-10-CM | POA: Diagnosis present

## 2020-11-10 DIAGNOSIS — I428 Other cardiomyopathies: Secondary | ICD-10-CM | POA: Diagnosis present

## 2020-11-10 DIAGNOSIS — E11649 Type 2 diabetes mellitus with hypoglycemia without coma: Secondary | ICD-10-CM | POA: Diagnosis present

## 2020-11-10 DIAGNOSIS — I5021 Acute systolic (congestive) heart failure: Secondary | ICD-10-CM

## 2020-11-10 DIAGNOSIS — E876 Hypokalemia: Secondary | ICD-10-CM | POA: Diagnosis present

## 2020-11-10 DIAGNOSIS — I513 Intracardiac thrombosis, not elsewhere classified: Secondary | ICD-10-CM | POA: Diagnosis present

## 2020-11-10 DIAGNOSIS — R0902 Hypoxemia: Secondary | ICD-10-CM | POA: Diagnosis present

## 2020-11-10 DIAGNOSIS — Z9119 Patient's noncompliance with other medical treatment and regimen: Secondary | ICD-10-CM

## 2020-11-10 DIAGNOSIS — D72829 Elevated white blood cell count, unspecified: Secondary | ICD-10-CM | POA: Diagnosis not present

## 2020-11-10 DIAGNOSIS — Z720 Tobacco use: Secondary | ICD-10-CM | POA: Diagnosis present

## 2020-11-10 DIAGNOSIS — J441 Chronic obstructive pulmonary disease with (acute) exacerbation: Secondary | ICD-10-CM | POA: Diagnosis present

## 2020-11-10 DIAGNOSIS — Z79899 Other long term (current) drug therapy: Secondary | ICD-10-CM

## 2020-11-10 DIAGNOSIS — I5043 Acute on chronic combined systolic (congestive) and diastolic (congestive) heart failure: Secondary | ICD-10-CM | POA: Diagnosis present

## 2020-11-10 DIAGNOSIS — R7401 Elevation of levels of liver transaminase levels: Secondary | ICD-10-CM | POA: Diagnosis present

## 2020-11-10 DIAGNOSIS — R0600 Dyspnea, unspecified: Secondary | ICD-10-CM

## 2020-11-10 DIAGNOSIS — R778 Other specified abnormalities of plasma proteins: Secondary | ICD-10-CM | POA: Diagnosis present

## 2020-11-10 DIAGNOSIS — I10 Essential (primary) hypertension: Secondary | ICD-10-CM | POA: Diagnosis present

## 2020-11-10 DIAGNOSIS — Z8249 Family history of ischemic heart disease and other diseases of the circulatory system: Secondary | ICD-10-CM

## 2020-11-10 DIAGNOSIS — I4510 Unspecified right bundle-branch block: Secondary | ICD-10-CM | POA: Diagnosis present

## 2020-11-10 DIAGNOSIS — Z20822 Contact with and (suspected) exposure to covid-19: Secondary | ICD-10-CM | POA: Diagnosis present

## 2020-11-10 DIAGNOSIS — F1010 Alcohol abuse, uncomplicated: Secondary | ICD-10-CM | POA: Diagnosis present

## 2020-11-10 DIAGNOSIS — E1165 Type 2 diabetes mellitus with hyperglycemia: Secondary | ICD-10-CM | POA: Diagnosis present

## 2020-11-10 DIAGNOSIS — Z7982 Long term (current) use of aspirin: Secondary | ICD-10-CM

## 2020-11-10 DIAGNOSIS — R0602 Shortness of breath: Secondary | ICD-10-CM

## 2020-11-10 DIAGNOSIS — F17210 Nicotine dependence, cigarettes, uncomplicated: Secondary | ICD-10-CM | POA: Diagnosis present

## 2020-11-10 DIAGNOSIS — T380X5A Adverse effect of glucocorticoids and synthetic analogues, initial encounter: Secondary | ICD-10-CM | POA: Diagnosis not present

## 2020-11-10 DIAGNOSIS — I429 Cardiomyopathy, unspecified: Secondary | ICD-10-CM | POA: Diagnosis present

## 2020-11-10 LAB — COMPREHENSIVE METABOLIC PANEL
ALT: 156 U/L — ABNORMAL HIGH (ref 0–44)
AST: 170 U/L — ABNORMAL HIGH (ref 15–41)
Albumin: 3.4 g/dL — ABNORMAL LOW (ref 3.5–5.0)
Alkaline Phosphatase: 87 U/L (ref 38–126)
Anion gap: 11 (ref 5–15)
BUN: 9 mg/dL (ref 6–20)
CO2: 24 mmol/L (ref 22–32)
Calcium: 8.6 mg/dL — ABNORMAL LOW (ref 8.9–10.3)
Chloride: 98 mmol/L (ref 98–111)
Creatinine, Ser: 0.81 mg/dL (ref 0.61–1.24)
GFR, Estimated: >60 mL/min (ref >60)
Glucose, Bld: 450 mg/dL — ABNORMAL HIGH (ref 70–99)
Potassium: 3.4 mmol/L — ABNORMAL LOW (ref 3.5–5.1)
Sodium: 133 mmol/L — ABNORMAL LOW (ref 135–145)
Total Bilirubin: 0.4 mg/dL (ref 0.3–1.2)
Total Protein: 6.6 g/dL (ref 6.5–8.1)

## 2020-11-10 LAB — CBC WITH DIFFERENTIAL/PLATELET
Abs Immature Granulocytes: 0.01 10*3/uL (ref 0.00–0.07)
Basophils Absolute: 0.1 10*3/uL (ref 0.0–0.1)
Basophils Relative: 1 %
Eosinophils Absolute: 0.2 10*3/uL (ref 0.0–0.5)
Eosinophils Relative: 3 %
HCT: 41.1 % (ref 39.0–52.0)
Hemoglobin: 14.3 g/dL (ref 13.0–17.0)
Immature Granulocytes: 0 %
Lymphocytes Relative: 30 %
Lymphs Abs: 2.4 10*3/uL (ref 0.7–4.0)
MCH: 32.6 pg (ref 26.0–34.0)
MCHC: 34.8 g/dL (ref 30.0–36.0)
MCV: 93.8 fL (ref 80.0–100.0)
Monocytes Absolute: 0.6 10*3/uL (ref 0.1–1.0)
Monocytes Relative: 7 %
Neutro Abs: 4.6 10*3/uL (ref 1.7–7.7)
Neutrophils Relative %: 59 %
Platelets: 167 10*3/uL (ref 150–400)
RBC: 4.38 MIL/uL (ref 4.22–5.81)
RDW: 11.7 % (ref 11.5–15.5)
WBC: 7.9 10*3/uL (ref 4.0–10.5)
nRBC: 0 % (ref 0.0–0.2)

## 2020-11-10 LAB — RESP PANEL BY RT-PCR (FLU A&B, COVID) ARPGX2
Influenza A by PCR: NEGATIVE
Influenza B by PCR: NEGATIVE
SARS Coronavirus 2 by RT PCR: NEGATIVE

## 2020-11-10 LAB — TROPONIN I (HIGH SENSITIVITY)
Troponin I (High Sensitivity): 62 ng/L — ABNORMAL HIGH (ref <18)
Troponin I (High Sensitivity): 43 ng/L — ABNORMAL HIGH (ref <18)

## 2020-11-10 LAB — D-DIMER, QUANTITATIVE: D-Dimer, Quant: 1.2 ug/mL-FEU — ABNORMAL HIGH (ref 0.00–0.50)

## 2020-11-10 MED ORDER — IOHEXOL 350 MG/ML SOLN
100.0000 mL | Freq: Once | INTRAVENOUS | Status: AC | PRN
  Administered 2020-11-10: 100 mL via INTRAVENOUS

## 2020-11-10 MED ORDER — ASPIRIN 325 MG PO TABS
325.0000 mg | ORAL_TABLET | Freq: Once | ORAL | Status: AC
  Administered 2020-11-11: 325 mg via ORAL
  Filled 2020-11-10: qty 1

## 2020-11-10 MED ORDER — METHYLPREDNISOLONE SODIUM SUCC 125 MG IJ SOLR
125.0000 mg | Freq: Once | INTRAMUSCULAR | Status: AC
  Administered 2020-11-10: 125 mg via INTRAVENOUS
  Filled 2020-11-10: qty 2

## 2020-11-10 NOTE — Telephone Encounter (Signed)
Christopher Cross is a 54 year old with COPD, diabetes, and hypertension, previously seen by Dr. Wyline Mood for chest pain with low risk exercise Cardiolite stress and unrevealing echocardiogram (last seen in 2017). He presented to the [facility] ED today with a constellation of symptoms including cough, shortness of breath, fevers, chills, and fatigue. He also endorses some tightness in his chest for about an hour yesterday, but this has not recurred. He is out of his medications and has been severely hypertensive in the ED, up to 184/126, but afebrile. COVID was negative. Labs were otherwise significant for an troponin of 62, elevated AST/ALT, glucose of 450, and an elevated d-dimer that prompted a CTA chest that was negative for PE. His ECG shows deep TWI diffusely, most prominent in the anterolateral leads.   Given the lack of chest pain, low-level troponin elevation, as well as other lab abnormalities and respiratory symptoms, the ECG changes could be due to myocarditis or stress cardiomyopathy, though ischemia certainly cannot be excluded.  For now, trend troponin and obtain complete echocardiogram. Can given full-dose aspirin, but would not recommend heparin for empiric ACS treatment at this time. Okay to admit at [facility] with cardiology consultation in the morning with more urgent transfer to [facility] if there is a significant rise in troponin, recurrent/ongoing chest pain, or dynamic ECG changes overnight.

## 2020-11-10 NOTE — ED Provider Notes (Signed)
[facility] EMERGENCY DEPARTMENT Provider Note   CSN: [id number] Arrival date & time: 11/10/20  1849     History Chief Complaint  Patient presents with   Cough    Christopher Cross is a 54 y.o. male.  HPI   Patient with significant medical history of asthma, diabetes, emphysema, hypertension current smoker presents to the emergency department chief complaint of shortness of breath.  Patient states symptoms started on Sunday and have progressively gotten worse.  He states he has difficulty getting a breath and especially on exertion he also endorses that he has had a slight cough but is unable to get anything out of it.  He has occasional fevers and chills but denies general body aches, nasal congestion, chest pain, abdominal pain, nausea, vomiting, diarrhea.  He states he has been unable to get his medications and has not been able to use his inhaler, he is not currently vaccinated against Covid denies recent sick contacts or recent travels.  Patient denies history of PEs, DVTs, denies leg pain or leg swelling, is not on hormone therapy, he denies any alleviating factors at this time.  Patient denies headaches, nasal congestion, sore throat, chest pain, abdominal pain, nausea vomiting diarrhea pedal edema.  Past Medical History:  Diagnosis Date   Asthma    Diabetes mellitus without complication (HCC)    Other emphysema (HCC)    Unspecified essential hypertension     Patient Active Problem List   Diagnosis Date Noted   Chest pain 07/15/2014   HTN (hypertension) 07/15/2014    History reviewed. No pertinent surgical history.     Family History  Problem Relation Age of Onset   Heart attack Other        Grandfather   Heart attack Other        Uncle    Social History   Tobacco Use   Smoking status: Current Every Day Smoker    Packs/day: 2.00    Types: Cigarettes    Start date: 12/21/2015   Smokeless tobacco: Never Used   Tobacco comment: started back after about  15 days   Substance Use Topics   Alcohol use: Yes    Alcohol/week: 0.0 standard drinks    Comment: daily   Drug use: No    Home Medications Prior to Admission medications   Medication Sig Start Date End Date Taking? Authorizing Provider  acetaminophen (TYLENOL) 500 MG tablet Take 1,000 mg every 6 (six) hours as needed by mouth for headache.    [provider]  amLODipine (NORVASC) 10 MG tablet Take 1 tablet (10 mg total) by mouth daily. Patient taking differently: Take 10 mg by mouth every evening.  11/23/17   Antoine Poche, MD  aspirin EC 81 MG tablet Take 1 tablet (81 mg total) by mouth daily. Patient taking differently: Take 81 mg by mouth every evening.  08/15/14   Antoine Poche, MD  Aspirin-Salicylamide-Caffeine (BC HEADACHE POWDER PO) Take 1 packet daily as needed by mouth (headache).    [provider]  atenolol (TENORMIN) 50 MG tablet TAKE 1 TABLET BY MOUTH DAILY. Patient taking differently: Take 50 mg by mouth every evening.  11/13/17   Antoine Poche, MD  atorvastatin (LIPITOR) 40 MG tablet Take 40 mg by mouth every evening.  07/02/20   [provider]  calcium carbonate (TUMS - DOSED IN MG ELEMENTAL CALCIUM) 500 MG chewable tablet Chew 2 tablets daily as needed by mouth for indigestion or heartburn.    [provider]  chlorthalidone (HYGROTON) 25 MG tablet TAKE 1/2 TABLET BY MOUTH EVERY DAY Patient taking differently: Take 12.5 mg by mouth every evening.  01/09/17   Antoine Poche, MD  ibuprofen (ADVIL,MOTRIN) 200 MG tablet Take 400 mg every 6 (six) hours as needed by mouth for headache.    [provider]  Insulin Glargine (BASAGLAR KWIKPEN) 100 UNIT/ML Inject 30 Units into the skin at bedtime. 06/26/20   [provider]  losartan (COZAAR) 25 MG tablet Take 25 mg by mouth every evening.  06/01/20   [provider]  metFORMIN (GLUCOPHAGE) 1000 MG tablet Take 1 tablet (1,000 mg total) 2 (two) times daily  by mouth. Patient taking differently: Take 2,000 mg by mouth every evening.  10/22/17   Rolland Porter, MD  Multiple Vitamin (MULTIVITAMIN WITH MINERALS) TABS tablet Take 1 tablet by mouth every evening.     [provider]  nitroGLYCERIN (NITROSTAT) 0.4 MG SL tablet Place 0.4 mg under the tongue every 5 (five) minutes as needed for chest pain.    [provider]  ondansetron (ZOFRAN ODT) 4 MG disintegrating tablet Take 1 tablet (4 mg total) by mouth every 8 (eight) hours as needed for up to 10 doses for nausea or vomiting. 07/22/20   Sabino Donovan, MD  oxyCODONE-acetaminophen (PERCOCET/ROXICET) 5-325 MG tablet Take 2 tablets by mouth every 6 (six) hours as needed for up to 12 doses for severe pain. 07/22/20   Sabino Donovan, MD  PROAIR HFA 108 [phone] Base) MCG/ACT inhaler INHALE 1 TO 2 PUFFS EVERY SIX HOURS AS NEEDED FOR WHEEZING OR SHORTNESS OF BREATH Patient taking differently: Inhale 1-2 puffs into the lungs every 6 (six) hours as needed for wheezing or shortness of breath.  10/24/16   Antoine Poche, MD  sildenafil (VIAGRA) 100 MG tablet TAKE 1/2 TAB 30 MINS PRIOR Patient taking differently: Take 50 mg by mouth as needed for erectile dysfunction.  03/01/16   Antoine Poche, MD  sitaGLIPtin (JANUVIA) 100 MG tablet Take 100 mg by mouth every evening.  04/16/18   [provider]  SPIRIVA HANDIHALER 18 MCG inhalation capsule Place 1 capsule into inhaler and inhale daily. 06/01/20   [provider]    Allergies    Patient has no known allergies.  Review of Systems   Review of Systems  Constitutional: Positive for chills and fever.  HENT: Negative for congestion and sore throat.   Eyes: Negative for visual disturbance.  Respiratory: Positive for cough and shortness of breath.   Cardiovascular: Negative for chest pain.  Gastrointestinal: Negative for abdominal pain, diarrhea, nausea and vomiting.  Genitourinary: Negative for enuresis.  Musculoskeletal: Negative for  back pain.  Skin: Negative for rash.  Neurological: Negative for dizziness.  Hematological: Does not bruise/bleed easily.    Physical Exam Updated Vital Signs BP (!) 184/126    Pulse 96    Temp 98.1 F (36.7 C) (Oral)    Resp (!) 21    Ht 5\' 9"  (1.753 m)    Wt 90.7 kg    SpO2 96%    BMI 29.53 kg/m   Physical Exam Vitals and nursing note reviewed.  Constitutional:      General: He is not in acute distress.    Appearance: He is not ill-appearing.  HENT:     Head: Normocephalic and atraumatic.     Nose: No congestion.     Mouth/Throat:     Mouth: Mucous membranes are moist.  Pharynx: No oropharyngeal exudate.  Eyes:     Conjunctiva/sclera: Conjunctivae normal.  Cardiovascular:     Rate and Rhythm: Regular rhythm. Tachycardia present.     Pulses: Normal pulses.     Heart sounds: No murmur heard.  No friction rub. No gallop.   Pulmonary:     Effort: No respiratory distress.     Breath sounds: No wheezing, rhonchi or rales.     Comments: On my exam patient had good rise and fall of chest no flail chest noted.  He had bilateral wheezing heard throughout the upper and lower lobes, no rhonchi or rales present.  Patient was not in respiratory distress no new respiratory requirements Abdominal:     Palpations: Abdomen is soft.     Tenderness: There is no abdominal tenderness.  Musculoskeletal:     Right lower leg: No edema.     Left lower leg: No edema.  Skin:    General: Skin is warm and dry.  Neurological:     Mental Status: He is alert.  Psychiatric:        Mood and Affect: Mood normal.     ED Results / Procedures / Treatments   Labs (all labs ordered are listed, but only abnormal results are displayed) Labs Reviewed  COMPREHENSIVE METABOLIC PANEL - Abnormal; Notable for the following components:      Result Value   Sodium 133 (*)    Potassium 3.4 (*)    Glucose, Bld 450 (*)    Calcium 8.6 (*)    Albumin 3.4 (*)    AST 170 (*)    ALT 156 (*)    All other  components within normal limits  D-DIMER, QUANTITATIVE (NOT AT [facility]) - Abnormal; Notable for the following components:   D-Dimer, Quant 1.20 (*)    All other components within normal limits  TROPONIN I (HIGH SENSITIVITY) - Abnormal; Notable for the following components:   Troponin I (High Sensitivity) 62 (*)    All other components within normal limits  RESP PANEL BY RT-PCR (FLU A&B, COVID) ARPGX2  CBC WITH DIFFERENTIAL/PLATELET  TROPONIN I (HIGH SENSITIVITY)    EKG EKG Interpretation  Date/Time:  Tuesday November 10 2020 19:48:21 EST Ventricular Rate:  91 PR Interval:  154 QRS Duration: 102 QT Interval:  432 QTC Calculation: 531 R Axis:   -176 Text Interpretation: Normal sinus rhythm Right superior axis deviation Incomplete right bundle branch block ST & Marked T-wave abnormality, consider inferolateral ischemia Prolonged QT Abnormal ECG No old tracing to compare Confirmed by Melene Plan [phone]) on 11/10/2020 9:07:15 PM   Radiology DG Chest Portable 1 View  Result Date: 11/10/2020 CLINICAL DATA:  Cough, fever.  Asthma. EXAM: PORTABLE CHEST 1 VIEW COMPARISON:  Chest x-ray 02/21/2014 FINDINGS: The heart size and mediastinal contours are within normal limits. No focal consolidation. Increased interstitial markings with no overt pulmonary edema. No pleural effusion. No pneumothorax. Apical lungs collimated off view due to overlying soft tissues and osseous structures. No acute osseous abnormality. IMPRESSION: 1. No active cardiopulmonary disease. 2. Apical lungs collimated off view due to overlying soft tissues and osseous structures. Electronically Signed   By: Tish Frederickson M.D.   On: 11/10/2020 19:40    Procedures Procedures (including critical care time)  Medications Ordered in ED Medications  iohexol (OMNIPAQUE) 350 MG/ML injection 100 mL (has no administration in time range)  methylPREDNISolone sodium succinate (SOLU-MEDROL) 125 mg/2 mL injection 125 mg (125 mg Intravenous  Given 11/10/20 2003)    ED  Course  I have reviewed the triage vital signs and the nursing notes.  Pertinent labs & imaging results that were available during my care of the patient were reviewed by me and considered in my medical decision making (see chart for details).    MDM Rules/Calculators/A&P                          Patient presents with shortness of breath.  He was alert, did not appear in acute distress, vital signs sent for tachycardia.  Will order basic labs, troponins, D-dimer, respiratory panel chest pain EKG.  Will provide him with Solu-Medrol and a breathing treatment.  With elevated D-dimer as well as slightly elevated troponin of 62 concern for PE.  Will send patient down to CT angio for further evaluation.  CBC negative for leukocytosis or signs of anemia.  CMP shows slight hypokalemia of 133, hypokalemia 3.4, hyperglycemia 450, elevated liver enzymes, no anion gap noted.  Patient has elevated D-dimer.  Initial troponins 62, respiratory panel negative for influenza and Covid.  Chest x-ray does not reveal any acute findings.  EKG shows sinus rhythm with right axis deviation as well as T wave inversions.  No old EKGs to compare unsure if this is new versus old.  Low suspicion for AAA or aortic dissection as history is atypical, patient has low risk factors.  Low suspicion for systemic infection as patient is nontoxic-appearing, vital signs reassuring, no obvious source infection noted on exam.  Low suspicion for intra-abdominal abnormality as patient denies abdominal pain, tolerating p.o., no acute abdomen on exam. I have low suspicion for ACS as history is atypical, patient denies chest pain, patient has no cardiac history.  But patient has a slightly elevated troponin and EKG with T wave inversions unknown if this is new or old possible patient shortness of breath could be secondary to cardiac abnormality.  I am more suspicious for PE as patient has shortness of breath, he is  slightly tachycardic and has an elevated D-dimer.   Due to shift change patient will be handed to [facility] provided HPI, current work-up, likely disposition.  I would consult cardiology if CT angio is unremarkable  and admitted for atypical EKG with elevated troponins.  If patient has noted PE would admit patient for further observation management.   Final Clinical Impression(s) / ED Diagnoses Final diagnoses:  SOB (shortness of breath)    Rx / DC Orders ED Discharge Orders    None       Carroll Sage, PA-C 11/10/20 2225    Melene Plan, DO 11/10/20 2303

## 2020-11-10 NOTE — ED Notes (Signed)
Patient Blood pressure is elevated. Patient states that he has not taken any of his medications today. States that he started puking at home and decided to come to ER. Patient states that is hungry at this time. Also asking if he could have a nicotine patch. Will check with RN.

## 2020-11-10 NOTE — ED Notes (Signed)
X Ray at bedside at this time.  

## 2020-11-10 NOTE — ED Triage Notes (Signed)
Pt with cough (hard enough to cause him to vomit) and fever since yesterday.  Denies any covid exposure or test.  Pt denies taking Covid vaccines.

## 2020-11-11 ENCOUNTER — Observation Stay (HOSPITAL_COMMUNITY): Payer: Self-pay

## 2020-11-11 ENCOUNTER — Observation Stay (HOSPITAL_BASED_OUTPATIENT_CLINIC_OR_DEPARTMENT_OTHER): Payer: Self-pay

## 2020-11-11 ENCOUNTER — Encounter (HOSPITAL_COMMUNITY): Payer: Self-pay | Admitting: Internal Medicine

## 2020-11-11 DIAGNOSIS — E876 Hypokalemia: Secondary | ICD-10-CM

## 2020-11-11 DIAGNOSIS — E11649 Type 2 diabetes mellitus with hypoglycemia without coma: Secondary | ICD-10-CM | POA: Diagnosis present

## 2020-11-11 DIAGNOSIS — Z720 Tobacco use: Secondary | ICD-10-CM | POA: Diagnosis present

## 2020-11-11 DIAGNOSIS — R9431 Abnormal electrocardiogram [ECG] [EKG]: Secondary | ICD-10-CM

## 2020-11-11 DIAGNOSIS — R0602 Shortness of breath: Secondary | ICD-10-CM

## 2020-11-11 DIAGNOSIS — R7401 Elevation of levels of liver transaminase levels: Secondary | ICD-10-CM

## 2020-11-11 DIAGNOSIS — E1169 Type 2 diabetes mellitus with other specified complication: Secondary | ICD-10-CM

## 2020-11-11 DIAGNOSIS — R778 Other specified abnormalities of plasma proteins: Secondary | ICD-10-CM | POA: Diagnosis present

## 2020-11-11 DIAGNOSIS — I10 Essential (primary) hypertension: Secondary | ICD-10-CM

## 2020-11-11 DIAGNOSIS — J441 Chronic obstructive pulmonary disease with (acute) exacerbation: Secondary | ICD-10-CM | POA: Diagnosis present

## 2020-11-11 LAB — COMPREHENSIVE METABOLIC PANEL
ALT: 148 U/L — ABNORMAL HIGH (ref 0–44)
ALT: 139 U/L — ABNORMAL HIGH (ref 0–44)
AST: 94 U/L — ABNORMAL HIGH (ref 15–41)
AST: 74 U/L — ABNORMAL HIGH (ref 15–41)
Albumin: 3.6 g/dL (ref 3.5–5.0)
Albumin: 3.6 g/dL (ref 3.5–5.0)
Alkaline Phosphatase: 93 U/L (ref 38–126)
Alkaline Phosphatase: 88 U/L (ref 38–126)
Anion gap: 9 (ref 5–15)
Anion gap: 8 (ref 5–15)
BUN: 11 mg/dL (ref 6–20)
BUN: 12 mg/dL (ref 6–20)
CO2: 23 mmol/L (ref 22–32)
CO2: 23 mmol/L (ref 22–32)
Calcium: 8.5 mg/dL — ABNORMAL LOW (ref 8.9–10.3)
Calcium: 8.5 mg/dL — ABNORMAL LOW (ref 8.9–10.3)
Chloride: 100 mmol/L (ref 98–111)
Chloride: 103 mmol/L (ref 98–111)
Creatinine, Ser: 0.74 mg/dL (ref 0.61–1.24)
Creatinine, Ser: 0.71 mg/dL (ref 0.61–1.24)
GFR, Estimated: >60 mL/min (ref >60)
GFR, Estimated: >60 mL/min (ref >60)
Glucose, Bld: 426 mg/dL — ABNORMAL HIGH (ref 70–99)
Glucose, Bld: 322 mg/dL — ABNORMAL HIGH (ref 70–99)
Potassium: 3.9 mmol/L (ref 3.5–5.1)
Potassium: 4 mmol/L (ref 3.5–5.1)
Sodium: 132 mmol/L — ABNORMAL LOW (ref 135–145)
Sodium: 134 mmol/L — ABNORMAL LOW (ref 135–145)
Total Bilirubin: 0.7 mg/dL (ref 0.3–1.2)
Total Bilirubin: 0.4 mg/dL (ref 0.3–1.2)
Total Protein: 6.9 g/dL (ref 6.5–8.1)
Total Protein: 6.9 g/dL (ref 6.5–8.1)

## 2020-11-11 LAB — URINALYSIS, ROUTINE W REFLEX MICROSCOPIC
Bacteria, UA: NONE SEEN
Bilirubin Urine: NEGATIVE
Glucose, UA: >=500 mg/dL — AB
Ketones, ur: 5 mg/dL — AB
Leukocytes,Ua: NEGATIVE
Nitrite: NEGATIVE
Protein, ur: NEGATIVE mg/dL
Specific Gravity, Urine: 1.038 — ABNORMAL HIGH (ref 1.005–1.030)
pH: 7 (ref 5.0–8.0)

## 2020-11-11 LAB — HIV ANTIBODY (ROUTINE TESTING W REFLEX): HIV Screen 4th Generation wRfx: NONREACTIVE

## 2020-11-11 LAB — CBC
HCT: 43.9 % (ref 39.0–52.0)
HCT: 43.8 % (ref 39.0–52.0)
Hemoglobin: 15.3 g/dL (ref 13.0–17.0)
Hemoglobin: 15.2 g/dL (ref 13.0–17.0)
MCH: 32.7 pg (ref 26.0–34.0)
MCH: 32.1 pg (ref 26.0–34.0)
MCHC: 34.9 g/dL (ref 30.0–36.0)
MCHC: 34.7 g/dL (ref 30.0–36.0)
MCV: 93.8 fL (ref 80.0–100.0)
MCV: 92.6 fL (ref 80.0–100.0)
Platelets: 187 10*3/uL (ref 150–400)
Platelets: 199 10*3/uL (ref 150–400)
RBC: 4.68 MIL/uL (ref 4.22–5.81)
RBC: 4.73 MIL/uL (ref 4.22–5.81)
RDW: 11.7 % (ref 11.5–15.5)
RDW: 11.5 % (ref 11.5–15.5)
WBC: 6.9 10*3/uL (ref 4.0–10.5)
WBC: 12.8 10*3/uL — ABNORMAL HIGH (ref 4.0–10.5)
nRBC: 0 % (ref 0.0–0.2)
nRBC: 0 % (ref 0.0–0.2)

## 2020-11-11 LAB — CBG MONITORING, ED
Glucose-Capillary: 399 mg/dL — ABNORMAL HIGH (ref 70–99)
Glucose-Capillary: 422 mg/dL — ABNORMAL HIGH (ref 70–99)
Glucose-Capillary: 437 mg/dL — ABNORMAL HIGH (ref 70–99)

## 2020-11-11 LAB — ECHOCARDIOGRAM COMPLETE
Area-P 1/2: 3.97 cm2
Height: 69 in
S' Lateral: 3.89 cm
Weight: 3200 oz

## 2020-11-11 LAB — GLUCOSE, CAPILLARY
Glucose-Capillary: 307 mg/dL — ABNORMAL HIGH (ref 70–99)
Glucose-Capillary: 468 mg/dL — ABNORMAL HIGH (ref 70–99)
Glucose-Capillary: 412 mg/dL — ABNORMAL HIGH (ref 70–99)
Glucose-Capillary: 365 mg/dL — ABNORMAL HIGH (ref 70–99)

## 2020-11-11 LAB — BRAIN NATRIURETIC PEPTIDE: B Natriuretic Peptide: 360 pg/mL — ABNORMAL HIGH (ref 0.0–100.0)

## 2020-11-11 LAB — MAGNESIUM
Magnesium: 2 mg/dL (ref 1.7–2.4)
Magnesium: 2.4 mg/dL (ref 1.7–2.4)

## 2020-11-11 LAB — HEMOGLOBIN A1C
Hgb A1c MFr Bld: 10.8 % — ABNORMAL HIGH (ref 4.8–5.6)
Mean Plasma Glucose: 263.26 mg/dL

## 2020-11-11 LAB — PHOSPHORUS
Phosphorus: 3 mg/dL (ref 2.5–4.6)
Phosphorus: 2.7 mg/dL (ref 2.5–4.6)

## 2020-11-11 LAB — HEPARIN LEVEL (UNFRACTIONATED): Heparin Unfractionated: 0.6 IU/mL (ref 0.30–0.70)

## 2020-11-11 LAB — GLUCOSE, RANDOM: Glucose, Bld: 334 mg/dL — ABNORMAL HIGH (ref 70–99)

## 2020-11-11 MED ORDER — ADULT MULTIVITAMIN W/MINERALS CH
1.0000 | ORAL_TABLET | Freq: Every day | ORAL | Status: DC
  Administered 2020-11-11 – 2020-11-15 (×5): 1 via ORAL
  Filled 2020-11-11 (×5): qty 1

## 2020-11-11 MED ORDER — INFLUENZA VAC SPLIT QUAD 0.5 ML IM SUSY
0.5000 mL | PREFILLED_SYRINGE | INTRAMUSCULAR | Status: AC
  Administered 2020-11-12: 0.5 mL via INTRAMUSCULAR
  Filled 2020-11-11: qty 0.5

## 2020-11-11 MED ORDER — AMLODIPINE BESYLATE 5 MG PO TABS: 10.0000 mg | ORAL_TABLET | Freq: Every evening | ORAL | Status: DC

## 2020-11-11 MED ORDER — THIAMINE HCL 100 MG PO TABS
100.0000 mg | ORAL_TABLET | Freq: Every day | ORAL | Status: DC
  Administered 2020-11-11 – 2020-11-15 (×5): 100 mg via ORAL
  Filled 2020-11-11 (×6): qty 1

## 2020-11-11 MED ORDER — PERFLUTREN LIPID MICROSPHERE
1.0000 mL | INTRAVENOUS | Status: AC | PRN
  Administered 2020-11-11: 1 mL via INTRAVENOUS
  Filled 2020-11-11: qty 10

## 2020-11-11 MED ORDER — PREDNISONE 20 MG PO TABS: 40.0000 mg | ORAL_TABLET | Freq: Every day | ORAL | Status: DC

## 2020-11-11 MED ORDER — LORAZEPAM 1 MG PO TABS: 1.0000 mg | ORAL_TABLET | ORAL | Status: AC | PRN

## 2020-11-11 MED ORDER — PROCHLORPERAZINE EDISYLATE 10 MG/2ML IJ SOLN: 5.0000 mg | INTRAMUSCULAR | Status: DC | PRN

## 2020-11-11 MED ORDER — ATENOLOL 25 MG PO TABS: 50.0000 mg | ORAL_TABLET | Freq: Every evening | ORAL | Status: DC

## 2020-11-11 MED ORDER — SODIUM CHLORIDE 0.45 % IV SOLN: INTRAVENOUS | Status: DC

## 2020-11-11 MED ORDER — LOSARTAN POTASSIUM 25 MG PO TABS
25.0000 mg | ORAL_TABLET | Freq: Every evening | ORAL | Status: DC
  Administered 2020-11-11: 25 mg via ORAL
  Filled 2020-11-11: qty 1

## 2020-11-11 MED ORDER — METHYLPREDNISOLONE SODIUM SUCC 40 MG IJ SOLR
40.0000 mg | Freq: Four times a day (QID) | INTRAMUSCULAR | Status: DC
  Administered 2020-11-11: 40 mg via INTRAVENOUS
  Filled 2020-11-11: qty 1

## 2020-11-11 MED ORDER — ONDANSETRON HCL 4 MG PO TABS: 4.0000 mg | ORAL_TABLET | Freq: Four times a day (QID) | ORAL | Status: DC | PRN

## 2020-11-11 MED ORDER — SODIUM CHLORIDE 0.9 % IV SOLN: INTRAVENOUS | Status: DC

## 2020-11-11 MED ORDER — IPRATROPIUM-ALBUTEROL 0.5-2.5 (3) MG/3ML IN SOLN
3.0000 mL | Freq: Three times a day (TID) | RESPIRATORY_TRACT | Status: DC
  Administered 2020-11-11 – 2020-11-12 (×3): 3 mL via RESPIRATORY_TRACT
  Filled 2020-11-11 (×3): qty 3

## 2020-11-11 MED ORDER — METFORMIN HCL 500 MG PO TABS: 1000.0000 mg | ORAL_TABLET | Freq: Two times a day (BID) | ORAL | Status: DC

## 2020-11-11 MED ORDER — INSULIN ASPART 100 UNIT/ML ~~LOC~~ SOLN
30.0000 [IU] | Freq: Once | SUBCUTANEOUS | Status: AC
  Administered 2020-11-11: 30 [IU] via SUBCUTANEOUS

## 2020-11-11 MED ORDER — INSULIN ASPART 100 UNIT/ML ~~LOC~~ SOLN
6.0000 [IU] | Freq: Once | SUBCUTANEOUS | Status: AC
  Administered 2020-11-11: 6 [IU] via SUBCUTANEOUS

## 2020-11-11 MED ORDER — ASPIRIN 81 MG PO CHEW
81.0000 mg | CHEWABLE_TABLET | ORAL | Status: AC
  Administered 2020-11-13: 81 mg via ORAL
  Filled 2020-11-11: qty 1

## 2020-11-11 MED ORDER — THIAMINE HCL 100 MG/ML IJ SOLN
100.0000 mg | Freq: Every day | INTRAMUSCULAR | Status: DC
  Filled 2020-11-11 (×2): qty 2

## 2020-11-11 MED ORDER — PNEUMOCOCCAL VAC POLYVALENT 25 MCG/0.5ML IJ INJ
0.5000 mL | INJECTION | INTRAMUSCULAR | Status: AC
  Administered 2020-11-12: 0.5 mL via INTRAMUSCULAR
  Filled 2020-11-11: qty 0.5

## 2020-11-11 MED ORDER — ACETAMINOPHEN 650 MG RE SUPP: 650.0000 mg | Freq: Four times a day (QID) | RECTAL | Status: DC | PRN

## 2020-11-11 MED ORDER — HEPARIN (PORCINE) 25000 UT/250ML-% IV SOLN
1500.0000 [IU]/h | INTRAVENOUS | Status: DC
  Administered 2020-11-11 – 2020-11-13 (×3): 1400 [IU]/h via INTRAVENOUS
  Filled 2020-11-11 (×3): qty 250

## 2020-11-11 MED ORDER — BUDESONIDE 0.5 MG/2ML IN SUSP
0.5000 mg | Freq: Two times a day (BID) | RESPIRATORY_TRACT | Status: DC
  Administered 2020-11-11 – 2020-11-15 (×8): 0.5 mg via RESPIRATORY_TRACT
  Filled 2020-11-11 (×8): qty 2

## 2020-11-11 MED ORDER — ENOXAPARIN SODIUM 40 MG/0.4ML ~~LOC~~ SOLN
40.0000 mg | SUBCUTANEOUS | Status: DC
  Administered 2020-11-11: 40 mg via SUBCUTANEOUS
  Filled 2020-11-11: qty 0.4

## 2020-11-11 MED ORDER — ASPIRIN EC 81 MG PO TBEC
81.0000 mg | DELAYED_RELEASE_TABLET | Freq: Every evening | ORAL | Status: DC
  Administered 2020-11-11 – 2020-11-14 (×4): 81 mg via ORAL
  Filled 2020-11-11 (×4): qty 1

## 2020-11-11 MED ORDER — SODIUM CHLORIDE 0.9 % IV SOLN: 250.0000 mL | INTRAVENOUS | Status: DC | PRN

## 2020-11-11 MED ORDER — SODIUM CHLORIDE 0.9% FLUSH: 3.0000 mL | INTRAVENOUS | Status: DC | PRN

## 2020-11-11 MED ORDER — IPRATROPIUM BROMIDE 0.02 % IN SOLN: 0.5000 mg | Freq: Four times a day (QID) | RESPIRATORY_TRACT | Status: DC

## 2020-11-11 MED ORDER — NICOTINE 21 MG/24HR TD PT24
21.0000 mg | MEDICATED_PATCH | Freq: Every day | TRANSDERMAL | Status: DC | PRN
  Administered 2020-11-11 – 2020-11-13 (×2): 21 mg via TRANSDERMAL
  Filled 2020-11-11 (×3): qty 1

## 2020-11-11 MED ORDER — ALBUTEROL SULFATE (2.5 MG/3ML) 0.083% IN NEBU: 2.5000 mg | INHALATION_SOLUTION | Freq: Four times a day (QID) | RESPIRATORY_TRACT | Status: DC

## 2020-11-11 MED ORDER — LINAGLIPTIN 5 MG PO TABS
5.0000 mg | ORAL_TABLET | Freq: Every day | ORAL | Status: DC
  Filled 2020-11-11 (×3): qty 1

## 2020-11-11 MED ORDER — LORAZEPAM 2 MG/ML IJ SOLN
1.0000 mg | INTRAMUSCULAR | Status: AC | PRN
  Filled 2020-11-11: qty 1

## 2020-11-11 MED ORDER — AMLODIPINE BESYLATE 5 MG PO TABS: 5.0000 mg | ORAL_TABLET | Freq: Every day | ORAL | Status: DC

## 2020-11-11 MED ORDER — NITROGLYCERIN 0.4 MG SL SUBL: 0.4000 mg | SUBLINGUAL_TABLET | SUBLINGUAL | Status: DC | PRN

## 2020-11-11 MED ORDER — METOPROLOL SUCCINATE ER 25 MG PO TB24
25.0000 mg | ORAL_TABLET | Freq: Every day | ORAL | Status: DC
  Administered 2020-11-11 – 2020-11-15 (×5): 25 mg via ORAL
  Filled 2020-11-11 (×5): qty 1

## 2020-11-11 MED ORDER — INSULIN GLARGINE 100 UNIT/ML ~~LOC~~ SOLN
30.0000 [IU] | Freq: Once | SUBCUTANEOUS | Status: AC
  Administered 2020-11-11: 30 [IU] via SUBCUTANEOUS
  Filled 2020-11-11: qty 0.3

## 2020-11-11 MED ORDER — IPRATROPIUM-ALBUTEROL 0.5-2.5 (3) MG/3ML IN SOLN
3.0000 mL | Freq: Four times a day (QID) | RESPIRATORY_TRACT | Status: DC
  Administered 2020-11-11 (×2): 3 mL via RESPIRATORY_TRACT
  Filled 2020-11-11 (×2): qty 3

## 2020-11-11 MED ORDER — HEPARIN BOLUS VIA INFUSION
4000.0000 [IU] | Freq: Once | INTRAVENOUS | Status: AC
  Administered 2020-11-11: 4000 [IU] via INTRAVENOUS
  Filled 2020-11-11: qty 4000

## 2020-11-11 MED ORDER — FOLIC ACID 1 MG PO TABS
1.0000 mg | ORAL_TABLET | Freq: Every day | ORAL | Status: DC
  Administered 2020-11-11 – 2020-11-15 (×5): 1 mg via ORAL
  Filled 2020-11-11 (×5): qty 1

## 2020-11-11 MED ORDER — ALBUTEROL SULFATE (2.5 MG/3ML) 0.083% IN NEBU: 2.5000 mg | INHALATION_SOLUTION | RESPIRATORY_TRACT | Status: DC | PRN

## 2020-11-11 MED ORDER — INSULIN ASPART PROT & ASPART (70-30 MIX) 100 UNIT/ML ~~LOC~~ SUSP
21.0000 [IU] | Freq: Two times a day (BID) | SUBCUTANEOUS | Status: DC
  Filled 2020-11-11: qty 10

## 2020-11-11 MED ORDER — SODIUM CHLORIDE 0.9% FLUSH
3.0000 mL | Freq: Two times a day (BID) | INTRAVENOUS | Status: DC
  Administered 2020-11-11 – 2020-11-14 (×4): 3 mL via INTRAVENOUS

## 2020-11-11 MED ORDER — METHYLPREDNISOLONE SODIUM SUCC 125 MG IJ SOLR
60.0000 mg | Freq: Two times a day (BID) | INTRAMUSCULAR | Status: DC
  Administered 2020-11-11 – 2020-11-12 (×2): 60 mg via INTRAVENOUS
  Filled 2020-11-11 (×2): qty 2

## 2020-11-11 MED ORDER — INSULIN GLARGINE 100 UNIT/ML ~~LOC~~ SOLN
30.0000 [IU] | Freq: Every day | SUBCUTANEOUS | Status: DC
  Filled 2020-11-11 (×2): qty 0.3

## 2020-11-11 MED ORDER — INSULIN ASPART 100 UNIT/ML ~~LOC~~ SOLN
10.0000 [IU] | Freq: Once | SUBCUTANEOUS | Status: AC
  Administered 2020-11-11: 10 [IU] via SUBCUTANEOUS

## 2020-11-11 MED ORDER — ACETAMINOPHEN 325 MG PO TABS: 650.0000 mg | ORAL_TABLET | Freq: Four times a day (QID) | ORAL | Status: DC | PRN

## 2020-11-11 MED ORDER — POTASSIUM CHLORIDE CRYS ER 20 MEQ PO TBCR
40.0000 meq | EXTENDED_RELEASE_TABLET | Freq: Once | ORAL | Status: AC
  Administered 2020-11-11: 40 meq via ORAL
  Filled 2020-11-11: qty 2

## 2020-11-11 MED ORDER — INSULIN ASPART 100 UNIT/ML ~~LOC~~ SOLN
14.0000 [IU] | Freq: Once | SUBCUTANEOUS | Status: AC
  Administered 2020-11-11: 14 [IU] via SUBCUTANEOUS
  Filled 2020-11-11: qty 1

## 2020-11-11 MED ORDER — INSULIN ASPART 100 UNIT/ML ~~LOC~~ SOLN
20.0000 [IU] | Freq: Once | SUBCUTANEOUS | Status: DC
  Filled 2020-11-11: qty 1

## 2020-11-11 MED ORDER — INSULIN ASPART PROT & ASPART (70-30 MIX) 100 UNIT/ML ~~LOC~~ SUSP
30.0000 [IU] | Freq: Two times a day (BID) | SUBCUTANEOUS | Status: DC
  Administered 2020-11-12 – 2020-11-13 (×4): 30 [IU] via SUBCUTANEOUS
  Filled 2020-11-11: qty 10

## 2020-11-11 MED ORDER — AMLODIPINE BESYLATE 5 MG PO TABS
10.0000 mg | ORAL_TABLET | Freq: Every day | ORAL | Status: DC
  Administered 2020-11-11: 10 mg via ORAL
  Filled 2020-11-11: qty 2

## 2020-11-11 MED ORDER — INSULIN ASPART 100 UNIT/ML ~~LOC~~ SOLN
0.0000 [IU] | Freq: Three times a day (TID) | SUBCUTANEOUS | Status: DC
  Administered 2020-11-11: 15 [IU] via SUBCUTANEOUS
  Administered 2020-11-12: 5 [IU] via SUBCUTANEOUS
  Administered 2020-11-12: 14 [IU] via SUBCUTANEOUS
  Administered 2020-11-13: 20 [IU] via SUBCUTANEOUS
  Administered 2020-11-13: 7 [IU] via SUBCUTANEOUS
  Administered 2020-11-14: 11 [IU] via SUBCUTANEOUS
  Administered 2020-11-14: 4 [IU] via SUBCUTANEOUS
  Administered 2020-11-14: 11 [IU] via SUBCUTANEOUS
  Administered 2020-11-15 (×2): 4 [IU] via SUBCUTANEOUS

## 2020-11-11 MED ORDER — ONDANSETRON HCL 4 MG/2ML IJ SOLN: 4.0000 mg | Freq: Four times a day (QID) | INTRAMUSCULAR | Status: DC | PRN

## 2020-11-11 MED ORDER — HYDRALAZINE HCL 25 MG PO TABS
25.0000 mg | ORAL_TABLET | Freq: Four times a day (QID) | ORAL | Status: DC | PRN
  Administered 2020-11-11: 25 mg via ORAL
  Filled 2020-11-11: qty 1

## 2020-11-11 MED ORDER — MAGNESIUM SULFATE 2 GM/50ML IV SOLN
2.0000 g | Freq: Once | INTRAVENOUS | Status: AC
  Administered 2020-11-11: 2 g via INTRAVENOUS
  Filled 2020-11-11: qty 50

## 2020-11-11 MED ORDER — ATORVASTATIN CALCIUM 40 MG PO TABS: 40.0000 mg | ORAL_TABLET | Freq: Every day | ORAL | Status: DC

## 2020-11-11 NOTE — Progress Notes (Signed)
ANTICOAGULATION CONSULT NOTE - Follow Up Consult  Pharmacy Consult for heparin Indication: apical thrombus  Labs: Recent Labs    11/10/20 2009 11/10/20 2009 11/10/20 2306 11/11/20 0428 11/11/20 0935 11/11/20 1813  HGB 14.3   < >  --  15.3 15.2  --   HCT 41.1  --   --  43.9 43.8  --   PLT 167  --   --  187 199  --   HEPARINUNFRC  --   --   --   --   --  0.60  CREATININE 0.81  --   --  0.74 0.71  --   TROPONINIHS 62*  --  43*  --   --   --    < > = values in this interval not displayed.    Assessment/Plan:  54yo male therapeutic on heparin with initial dosing for apical thrombus. Will continue gtt at current rate and confirm stable with am labs.   Vernard Gambles, PharmD, BCPS  11/11/2020,11:23 PM

## 2020-11-11 NOTE — ED Notes (Signed)
Pt provided with urinal. No other requests at this time. Call bell within reach, bed in low position. Will continue to monitor.

## 2020-11-11 NOTE — Progress Notes (Signed)
*  PRELIMINARY RESULTS* Echocardiogram 2D Echocardiogram with definity has been performed.  Christopher Cross 11/11/2020, 10:33 AM

## 2020-11-11 NOTE — Evaluation (Signed)
Physical Therapy Evaluation Patient Details Name: Christopher Cross MRN: [id number] DOB: 07-04-1966 Today's Date: 11/11/2020   History of Present Illness  Christopher Cross is a 54 y.o. male with medical history significant of asthma/emphysema, active tobacco use, hyperlipidemia, type 2 diabetes, essential hypertension who is coming to the emergency department due to progressively worse shortness of breath for several days associated with severe cough that has made him vomit and subjective fever since yesterday.  He denies rhinorrhea, sore throat, hemoptysis, palpitations, dizziness, diaphoresis, PND or orthopnea.  Denies abdominal pain, diarrhea, constipation, melena or hematochezia.  No dysuria, frequency or hematuria.  Positive polydipsia, polyuria, polyphagia and blurred vision.  He also mentions that earlier this year he lost his job and [employer].  He has not being able to take his medications and insulin as prescribed.  The last time he uses prescriptions and insulin work last week.    Clinical Impression  Patient does not demonstrate any limitations in functional mobility or activity tolerance.  Patient is independent in all aspects of mobility and ADL and demonstrates vital signs WNL during activity/exertion without adverse effects.  No further PT services indicated at this time.  NSG staff educated on patient status and patient advised to ambulate ad lib on the unit while wearing a mask to adhere to precautions.  Patient discharged from physical therapy to care of nursing for ambulation daily as tolerated for length of stay.    Follow Up Recommendations No PT follow up    Equipment Recommendations  None recommended by PT    Recommendations for Other Services       Precautions / Restrictions Precautions Precautions: None Restrictions Weight Bearing Restrictions: No      Mobility  Bed Mobility Overal bed mobility: Independent               Patient Response:  Cooperative  Transfers Overall transfer level: Independent                  Ambulation/Gait Ambulation/Gait assistance: Independent Gait Distance (Feet): 500 Feet Assistive device: None Gait Pattern/deviations: WFL(Within Functional Limits)   Gait velocity interpretation: >4.37 ft/sec, indicative of normal walking speed    Stairs Stairs: Yes Stairs assistance: Independent Stair Management: One rail Right Number of Stairs: 12    Wheelchair Mobility    Modified Rankin (Stroke Patients Only)       Balance Overall balance assessment: Independent                                           Pertinent Vitals/Pain Pain Assessment: No/denies pain    Home Living Family/patient expects to be discharged to:: Private residence Living Arrangements: Alone Available Help at Discharge: Family;Friend(s);Available PRN/intermittently Type of Home: Mobile home Home Access: Stairs to enter Entrance Stairs-Rails: Can reach both Entrance Stairs-Number of Steps: 4 Home Layout: One level Home Equipment: None      Prior Function Level of Independence: Independent               Hand Dominance        Extremity/Trunk Assessment   Upper Extremity Assessment Upper Extremity Assessment: Overall WFL for tasks assessed    Lower Extremity Assessment Lower Extremity Assessment: Overall WFL for tasks assessed    Cervical / Trunk Assessment Cervical / Trunk Assessment: Normal  Communication   Communication: No difficulties  Cognition Arousal/Alertness: Awake/alert Behavior During  Therapy: WFL for tasks assessed/performed Overall Cognitive Status: Within Functional Limits for tasks assessed                                        General Comments      Exercises     Assessment/Plan    PT Assessment Patent does not need any further PT services  PT Problem List         PT Treatment Interventions      PT Goals (Current goals  can be found in the Care Plan section)  Acute Rehab PT Goals Patient Stated Goal: Return home when able PT Goal Formulation: With patient Time For Goal Achievement: 11/11/20 Potential to Achieve Goals: Good    Frequency     Barriers to discharge        Co-evaluation               AM-PAC PT "6 Clicks" Mobility  Outcome Measure Help needed turning from your back to your side while in a flat bed without using bedrails?: None Help needed moving from lying on your back to sitting on the side of a flat bed without using bedrails?: None Help needed moving to and from a bed to a chair (including a wheelchair)?: None Help needed standing up from a chair using your arms (e.g., wheelchair or bedside chair)?: None Help needed to walk in hospital room?: None Help needed climbing 3-5 steps with a railing? : None 6 Click Score: 24    End of Session   Activity Tolerance: Patient tolerated treatment well Patient left: in bed Nurse Communication: Mobility status PT Visit Diagnosis: Other abnormalities of gait and mobility (R26.89);Muscle weakness (generalized) (M62.81);Unsteadiness on feet (R26.81)    Time: 8182-9937 PT Time Calculation (min) (ACUTE ONLY): 22 min   Charges:   PT Evaluation $PT Eval Low Complexity: 1 Low PT Treatments $Gait Training: 8-22 mins       11:08 AM, 11/11/20 M. Shary Decamp, PT, DPT Physical Therapist- [place] Office Number: [phone]

## 2020-11-11 NOTE — Progress Notes (Signed)
Nutrition Brief Note  Received nutrition consult from the COPD protocol order set. Patient reports no recent weight loss and that he has been eating very well.   Wt Readings from Last 15 Encounters:  11/10/20 90.7 kg  07/21/20 90.7 kg  10/22/17 93.1 kg  03/01/16 95.8 kg  12/28/15 102.4 kg  12/02/14 89.8 kg  08/15/14 92.7 kg  07/15/14 96.6 kg    Body mass index is 29.53 kg/m. Patient meets criteria for overweight based on current BMI.   Current diet order is heart healthy CHO modified, patient reports consuming 100% of meals at this time. Labs and medications reviewed.   No nutrition interventions warranted at this time. If nutrition issues arise, please consult RD.   Gabriel Rainwater, RD, LDN, CNSC Please refer to [facility] for contact information.

## 2020-11-11 NOTE — ED Notes (Signed)
D/t patient's BP continually being high, Dr. Robb Matar was messaged for BP meds. Awaiting orders.

## 2020-11-11 NOTE — Progress Notes (Signed)
ANTICOAGULATION CONSULT NOTE - Initial Consult  Pharmacy Consult for Heparin Indication: apical thrombus  No Known Allergies  Patient Measurements: Height: 5\' 9"  (175.3 cm) Weight: 90.7 kg (200 lb) IBW/kg (Calculated) : 70.7 HEPARIN DW (KG): 89.1  Vital Signs: Temp: 97.7 F (36.5 C) (11/24 0831) Temp Source: Oral (11/24 0831) BP: 145/102 (11/24 0831) Pulse Rate: 91 (11/24 1102)  Labs: Recent Labs    11/10/20 2009 11/10/20 2009 11/10/20 2306 11/11/20 0428 11/11/20 0935  HGB 14.3   < >  --  15.3 15.2  HCT 41.1  --   --  43.9 43.8  PLT 167  --   --  187 199  CREATININE 0.81  --   --  0.74 0.71  TROPONINIHS 62*  --  43*  --   --    < > = values in this interval not displayed.    Estimated Creatinine Clearance: 120.2 mL/min (by C-G formula based on SCr of 0.71 mg/dL).   Medical History: Past Medical History:  Diagnosis Date  . Asthma   . Diabetes mellitus without complication (HCC)   . Other emphysema (HCC)   . Unspecified essential hypertension     Medications:  Medications Prior to Admission  Medication Sig Dispense Refill Last Dose  . acetaminophen (TYLENOL) 500 MG tablet Take 1,000 mg every 6 (six) hours as needed by mouth for headache.   Past Month at Unknown time  . amLODipine (NORVASC) 10 MG tablet Take 1 tablet (10 mg total) by mouth daily. (Patient taking differently: Take 10 mg by mouth every evening. ) 15 tablet 0 Past Week at Unknown time  . aspirin EC 81 MG tablet Take 1 tablet (81 mg total) by mouth daily. (Patient taking differently: Take 81 mg by mouth every evening. )   Past Week at Unknown time  . Aspirin-Salicylamide-Caffeine (BC HEADACHE POWDER PO) Take 1 packet daily as needed by mouth (headache).   Past Month at Unknown time  . atenolol (TENORMIN) 50 MG tablet TAKE 1 TABLET BY MOUTH DAILY. (Patient taking differently: Take 50 mg by mouth every evening. ) 30 tablet 0 Past Week at Unknown time  . atorvastatin (LIPITOR) 40 MG tablet Take 40 mg by  mouth every evening.    Past Week at Unknown time  . calcium carbonate (TUMS - DOSED IN MG ELEMENTAL CALCIUM) 500 MG chewable tablet Chew 2 tablets daily as needed by mouth for indigestion or heartburn.   Past Week at Unknown time  . ibuprofen (ADVIL,MOTRIN) 200 MG tablet Take 400 mg every 6 (six) hours as needed by mouth for headache.   Past Month at Unknown time  . Insulin Glargine (BASAGLAR KWIKPEN) 100 UNIT/ML Inject 30 Units into the skin at bedtime.   Past Week at Unknown time  . losartan (COZAAR) 25 MG tablet Take 25 mg by mouth every evening.    Past Week at Unknown time  . metFORMIN (GLUCOPHAGE) 1000 MG tablet Take 1 tablet (1,000 mg total) 2 (two) times daily by mouth. (Patient taking differently: Take 2,000 mg by mouth every evening. ) 30 tablet 0 Past Week at Unknown time  . Multiple Vitamin (MULTIVITAMIN WITH MINERALS) TABS tablet Take 1 tablet by mouth every evening.    Past Week at Unknown time  . PROAIR HFA 108 (90 Base) MCG/ACT inhaler INHALE 1 TO 2 PUFFS EVERY SIX HOURS AS NEEDED FOR WHEEZING OR SHORTNESS OF BREATH (Patient taking differently: Inhale 1-2 puffs into the lungs every 6 (six) hours as needed for wheezing  or shortness of breath. ) 8.5 g 2 Past Month at Unknown time  . sitaGLIPtin (JANUVIA) 100 MG tablet Take 100 mg by mouth every evening.    Past Month at Unknown time  . SPIRIVA HANDIHALER 18 MCG inhalation capsule Place 1 capsule into inhaler and inhale daily.   Past Week at Unknown time  . nitroGLYCERIN (NITROSTAT) 0.4 MG SL tablet Place 0.4 mg under the tongue every 5 (five) minutes as needed for chest pain. (Patient not taking: Reported on 11/11/2020)   Not Taking at Unknown time  . ondansetron (ZOFRAN ODT) 4 MG disintegrating tablet Take 1 tablet (4 mg total) by mouth every 8 (eight) hours as needed for up to 10 doses for nausea or vomiting. (Patient not taking: Reported on 11/11/2020) 10 tablet 0 Not Taking at Unknown time  . oxyCODONE-acetaminophen (PERCOCET/ROXICET)  5-325 MG tablet Take 2 tablets by mouth every 6 (six) hours as needed for up to 12 doses for severe pain. (Patient not taking: Reported on 11/11/2020) 12 tablet 0 Not Taking at Unknown time  . sildenafil (VIAGRA) 100 MG tablet TAKE 1/2 TAB 30 MINS PRIOR (Patient not taking: Reported on 11/11/2020) 4 tablet 0 Not Taking at Unknown time    Assessment: Mr. Bruschi presents with recent worsening shortness of breath and cough as well as wheezing. He has elevated troponin values and abnormal EKG. CTA negative for PE and no mention of coronary calcifications by the report.No chest pain or palpitations . Patient had ECHO done and it shows an apical LV thrombus. Plan is to start heparin and  cardiac cath 11/26. Reviewed med list and not on oral anticoagulants.  Goal of Therapy:  Heparin level 0.3-0.7 units/ml Monitor platelets by anticoagulation protocol: Yes   Plan:  Give 4000 units bolus x 1 Start heparin infusion at 1400 units/hr Check anti-Xa level in ~6-8 hours hours and daily while on heparin Continue to monitor H&H and platelets  Elder Cyphers, BS Loura Back, BCPS Clinical Pharmacist Pager [phone] 11/11/2020,11:58 AM

## 2020-11-11 NOTE — ED Provider Notes (Signed)
Care of this patient was signed out to me by preceding ED provider Berle Mull, PA-C.  Please see his note for further detail.  54 year old male with history of diabetes, hypertension, emphysema.  Not currently taking medications, as he is unable to afford them.  Not vaccinated against COVID.  Patient presenting with 3 days of shortness of breath, single episode of chest pain yesterday while he was working in his yard.  Shortness of breath exacerbated by exertion.    Abnormal EKG abnormal EKG with marked T wave inversions, ST abnormalities concerning for inferolateral ischemia.  Additional incomplete right bundle branch block.  Chest x-ray negative.  CBC unremarkable, CMP with transaminitis.  Elevated troponin to 62, elevated D-dimer to 1.2.  Concern for PE given patient's story of single episode of chest pain and shortness of breath.   At time of shift change the patient was signed out to me pending CTA results, with plan to admit the patient to the hospital with cardiology consult.  CT angiogram of the chest was negative for pulmonary embolism or acute cardiopulmonary disease.  Cardiology consult placed; discussion with cardiology fellow Dr. Okey Dupre who recommends admission to medicine service, and possible transfer to [facility] if troponins trend upward.  If troponins stay stable or decline, patient may stay at [facility] with cardiology consult in the morning.  He is recommending echocardiogram, trending troponins through the night, add on a BNP, and aspirin.  At this time he does not feel patient warrants anticoagulation with heparin.  I appreciate his collaboration in the care of this patient.  Consult placed to hospitalist, Dr. Robb Matar, who is willing to admit this patient to his service. Appreciate his collaboration in the care of this patient.   Paris Lore, PA-C 11/11/20 0010    Melene Plan, DO 11/11/20 [phone]

## 2020-11-11 NOTE — Progress Notes (Signed)
OT Cancellation Note  Patient Details Name: Christopher Cross MRN: [id number] DOB: [birth year]   Cancelled Treatment:    Reason Eval/Treat Not Completed: OT screened, no needs identified, will sign off. Pt up moving around room completing various tasks on OT entry. Pt is completing ADLs and mobility tasks independently, no further OT services required at this time.   Ezra Sites, OTR/L  [phone] 11/11/2020, 8:48 AM

## 2020-11-11 NOTE — Consult Note (Addendum)
Cardiology Consult    Patient ID: Christopher Cross; [id number]; 12-14-1966   Admit date: 11/10/2020 Date of Consult: 11/11/2020  Primary Care Provider: Avon Gully, MD Primary Cardiologist: Dina Rich, MD   Patient Profile    Christopher Cross is a 54 y.o. male with past medical history of HTN, Type 2 DM, COPD, alcohol use and tobacco use who is being seen today for the evaluation of elevated troponin values and abnormal EKG at the request of Dr. Arbutus Leas.   History of Present Illness    Christopher Cross reports he has been under increased stress for the past year since the passing of his wife in 2020. He currently resides with his parents and reports consuming a significant amount of alcohol for several months following her death, up to 3-4 half-gallons of liquor a week. Says he has reduced his use but does not specify to what extent. He has not taken his medications in over a year including his prior HTN and DM medications. Over the past few months, he reports progressive dyspnea on exertion which is most notable at his job as he works for a [employer]. Says he can become short of breath at rest or with activity. Denies any recent chest pain but has experienced pain in the past which he reports with carrying large sticks. For the few days prior to admission, he noticed worsening dyspnea, wheezing and a cough. Was coughing so frequently, he started to experience nausea and vomiting. Denies any associated orthopnea, PND or edema.   He was hypertensive to 184/126 on admission. Initial labs showed WBC 7.9, Hgb 14.3, platelets 167, Na+ 133, K+ 3.4 and creatinine 0.81. Glucose 450.AST 170 and ALT 156. D-dimer 1.20. BNP 360. Initial HS Troponin 62 with repeat of 43. COVID negative. CXR with no active cardiopulmonary disease. CTA negative for PE and no mention of coronary calcifications by the report. EKG showed NSR, HR 91 with prominent TWI along the inferior and lateral leads which is new when  compared to prior tracings from 2017.  He was admitted for a COPD exacerbation and has been started on scheduled nebulizer treatments and steroids. Says his breathing feels significantly improved this AM. No chest pain or palpitations. He is scheduled for an Abdominal US and Echocardiogram later today.    Past Medical History:  Diagnosis Date  . Asthma   . Diabetes mellitus without complication (HCC)   . Other emphysema (HCC)   . Unspecified essential hypertension     History reviewed. No pertinent surgical history.   Home Medications:  Prior to Admission medications   Medication Sig Start Date End Date Taking? Authorizing Provider  amLODipine (NORVASC) 10 MG tablet Take 1 tablet (10 mg total) by mouth daily. Patient taking differently: Take 10 mg by mouth every evening.  11/23/17  Yes BranchDorothe Pea, MD  aspirin EC 81 MG tablet Take 1 tablet (81 mg total) by mouth daily. Patient taking differently: Take 81 mg by mouth every evening.  08/15/14  Yes Branch, Dorothe Pea, MD  atorvastatin (LIPITOR) 40 MG tablet Take 40 mg by mouth every evening.  07/02/20  Yes [provider]  Insulin Glargine (BASAGLAR KWIKPEN) 100 UNIT/ML Inject 30 Units into the skin at bedtime. 06/26/20  Yes [provider]  losartan (COZAAR) 25 MG tablet Take 25 mg by mouth every evening.  06/01/20  Yes [provider]  metFORMIN (GLUCOPHAGE) 1000 MG tablet Take 1 tablet (1,000 mg total) 2 (two) times daily  by mouth. Patient taking differently: Take 2,000 mg by mouth every evening.  10/22/17  Yes Rolland Porter, MD  sitaGLIPtin (JANUVIA) 100 MG tablet Take 100 mg by mouth every evening.  04/16/18  Yes [provider]  SPIRIVA HANDIHALER 18 MCG inhalation capsule Place 1 capsule into inhaler and inhale daily. 06/01/20  Yes [provider]  acetaminophen (TYLENOL) 500 MG tablet Take 1,000 mg every 6 (six) hours as needed by mouth for headache.    [provider]    Aspirin-Salicylamide-Caffeine (BC HEADACHE POWDER PO) Take 1 packet daily as needed by mouth (headache).    [provider]  atenolol (TENORMIN) 50 MG tablet TAKE 1 TABLET BY MOUTH DAILY. Patient taking differently: Take 50 mg by mouth every evening.  11/13/17   Antoine Poche, MD  calcium carbonate (TUMS - DOSED IN MG ELEMENTAL CALCIUM) 500 MG chewable tablet Chew 2 tablets daily as needed by mouth for indigestion or heartburn.    [provider]  chlorthalidone (HYGROTON) 25 MG tablet TAKE 1/2 TABLET BY MOUTH EVERY DAY Patient taking differently: Take 12.5 mg by mouth every evening.  01/09/17   Antoine Poche, MD  ibuprofen (ADVIL,MOTRIN) 200 MG tablet Take 400 mg every 6 (six) hours as needed by mouth for headache.    [provider]  Multiple Vitamin (MULTIVITAMIN WITH MINERALS) TABS tablet Take 1 tablet by mouth every evening.     [provider]  nitroGLYCERIN (NITROSTAT) 0.4 MG SL tablet Place 0.4 mg under the tongue every 5 (five) minutes as needed for chest pain.    [provider]  ondansetron (ZOFRAN ODT) 4 MG disintegrating tablet Take 1 tablet (4 mg total) by mouth every 8 (eight) hours as needed for up to 10 doses for nausea or vomiting. 07/22/20   Sabino Donovan, MD  oxyCODONE-acetaminophen (PERCOCET/ROXICET) 5-325 MG tablet Take 2 tablets by mouth every 6 (six) hours as needed for up to 12 doses for severe pain. 07/22/20   Sabino Donovan, MD  PROAIR HFA 108 [phone] Base) MCG/ACT inhaler INHALE 1 TO 2 PUFFS EVERY SIX HOURS AS NEEDED FOR WHEEZING OR SHORTNESS OF BREATH Patient taking differently: Inhale 1-2 puffs into the lungs every 6 (six) hours as needed for wheezing or shortness of breath.  10/24/16   Antoine Poche, MD  sildenafil (VIAGRA) 100 MG tablet TAKE 1/2 TAB 30 MINS PRIOR Patient taking differently: Take 50 mg by mouth as needed for erectile dysfunction.  03/01/16   Antoine Poche, MD    Inpatient Medications: Scheduled  Meds: . amLODipine  10 mg Oral Daily  . aspirin EC  81 mg Oral QPM  . atenolol  50 mg Oral QPM  . budesonide (PULMICORT) nebulizer solution  0.5 mg Nebulization BID  . enoxaparin (LOVENOX) injection  40 mg Subcutaneous Q24H  . folic acid  1 mg Oral Daily  . insulin aspart  0-20 Units Subcutaneous TID WC  . [START ON 11/12/2020] insulin aspart protamine- aspart  21 Units Subcutaneous BID WC  . ipratropium-albuterol  3 mL Nebulization TID  . losartan  25 mg Oral QPM  . methylPREDNISolone (SOLU-MEDROL) injection  60 mg Intravenous Q12H  . multivitamin with minerals  1 tablet Oral Daily  . thiamine  100 mg Oral Daily   Or  . thiamine  100 mg Intravenous Daily   Continuous Infusions: . sodium chloride     PRN Meds: acetaminophen **OR** acetaminophen, albuterol, LORazepam **OR** LORazepam, nicotine, nitroGLYCERIN, prochlorperazine  Allergies:  No Known Allergies  Social History:   Social History   Socioeconomic History  . Marital status: Legally Separated    Spouse name: Not on file  . Number of children: Not on file  . Years of education: Not on file  . Highest education level: Not on file  Occupational History  . Not on file  Tobacco Use  . Smoking status: Current Every Day Smoker    Packs/day: 2.00    Types: Cigarettes    Start date: 12/21/2015  . Smokeless tobacco: Never Used  . Tobacco comment: started back after about 15 days   Substance and Sexual Activity  . Alcohol use: Yes    Alcohol/week: 0.0 standard drinks    Comment: daily  . Drug use: No  . Sexual activity: Not on file  Other Topics Concern  . Not on file  Social History Narrative  . Not on file   Social Determinants of Health   Financial Resource Strain:   . Difficulty of Paying Living Expenses: Not on file  Food Insecurity:   . Worried About Programme researcher, broadcasting/film/video in the Last Year: Not on file  . Ran Out of Food in the Last Year: Not on file  Transportation Needs:   . Lack of Transportation  (Medical): Not on file  . Lack of Transportation (Non-Medical): Not on file  Physical Activity:   . Days of Exercise per Week: Not on file  . Minutes of Exercise per Session: Not on file  Stress:   . Feeling of Stress : Not on file  Social Connections:   . Frequency of Communication with Friends and Family: Not on file  . Frequency of Social Gatherings with Friends and Family: Not on file  . Attends Religious Services: Not on file  . Active Member of Clubs or Organizations: Not on file  . Attends Banker Meetings: Not on file  . Marital Status: Not on file  Intimate Partner Violence:   . Fear of Current or Ex-Partner: Not on file  . Emotionally Abused: Not on file  . Physically Abused: Not on file  . Sexually Abused: Not on file     Family History:    Family History  Problem Relation Age of Onset  . Heart attack Other        Grandfather  . Heart attack Other        Uncle      Review of Systems    General:  No chills, fever, night sweats or weight changes.  Cardiovascular:  No edema, orthopnea, palpitations, paroxysmal nocturnal dyspnea. Positive for dyspnea on exertion and chest pain.  Dermatological: No rash, lesions/masses Respiratory: Positive for cough and dyspnea. Urologic: No hematuria, dysuria Abdominal:   No nausea, vomiting, diarrhea, bright red blood per rectum, melena, or hematemesis Neurologic:  No visual changes, wkns, changes in mental status. All other systems reviewed and are otherwise negative except as noted above.  Physical Exam/Data    Vitals:   11/11/20 0600 11/11/20 0630 11/11/20 0830 11/11/20 0831  BP: (!) 134/93 (!) 139/93  (!) 145/102  Pulse: 75 69  84  Resp: Temp:    97.7 F (36.5 C)  TempSrc:    Oral  SpO2: 95% 97% 98% 98%  Weight:      Height:        Intake/Output Summary (Last 24 hours) at 11/11/2020 1007 Last data filed at 11/11/2020 0528 Gross per 24 hour  Intake --  Output 1200 ml  Net -1200 ml    Filed Weights   11/10/20 1858  Weight: 90.7 kg   Body mass index is 29.53 kg/m.   General: Pleasant male appearing in NAD Psych: Normal affect. Neuro: Alert and oriented X 3. Moves all extremities spontaneously. HEENT: Normal  Neck: Supple without bruits or JVD. Lungs:  Resp regular and unlabored, scattered rhonchi. Heart: RRR no s3, s4, or murmurs. Abdomen: Soft, non-tender, non-distended, BS + x 4.  Extremities: No clubbing, cyanosis or lower extremity edema. DP/PT/Radials 2+ and equal bilaterally.   EKG:  The EKG was personally reviewed and demonstrates: NSR, HR 91 with prominent TWI along the inferior and lateral leads which is new when compared to prior tracings from 2017.  Telemetry:  Not currently connected to telemetry.    Labs/Studies     Relevant CV Studies:  Echocardiogram: 06/2014 Study Conclusions   - Left ventricle: The cavity size was at the upper limits of  normal. Wall thickness was normal. Systolic function was normal.  The estimated ejection fraction was in the range of 50% to 55%.  Wall motion was normal; there were no regional wall motion  abnormalities. Doppler parameters are consistent with abnormal  left ventricular relaxation (grade 1 diastolic dysfunction).  - Aortic valve: Mildly calcified annulus. Trileaflet. There was no  significant regurgitation.  - Mitral valve: There was trivial regurgitation.  - Right atrium: Central venous pressure (est): 3 mm Hg.  - Atrial septum: No defect or patent foramen ovale was identified.  - Tricuspid valve: There was physiologic regurgitation.  - Pulmonary arteries: Systolic pressure could not be accurately  estimated.  - Pericardium, extracardiac: There was no pericardial effusion.   Impressions:   - Upper normal LV chamber size with LVEF 50-55%, grade 1 diastolic  dysfunction. No major valvular abnormalities. Unable to assess  PASP.    NST: 07/2014 IMPRESSION:  1. Negative  exercise MPI for ischemia   2. Normal LV systolic function, LVEF 48%   3. Duke score of 7, consistent with low risk for major cardiac  events.   4. Very good exercise function capacity (120% of predicted based on  age and gender).   Laboratory Data:  Chemistry Recent Labs  Lab 11/10/20 2009 11/11/20 0428 11/11/20 0935  NA 133* 132* 134*  K 3.4* 3.9 4.0  CL 98 100 103  CO2 24 23 23   GLUCOSE 450* 426* 322*  BUN 9 11 12   CREATININE 0.81 0.74 0.71  CALCIUM 8.6* 8.5* 8.5*  GFRNONAA >60 >60 >60  ANIONGAP 11 9 8     Recent Labs  Lab 11/10/20 2009 11/11/20 0428 11/11/20 0935  PROT 6.6 6.9 6.9  ALBUMIN 3.4* 3.6 3.6  AST 170* 94* 74*  ALT 156* 148* 139*  ALKPHOS 87 93 88  BILITOT 0.4 0.7 0.4   Hematology Recent Labs  Lab 11/10/20 2009 11/11/20 0428 11/11/20 0935  WBC 7.9 6.9 12.8*  RBC 4.38 4.68 4.73  HGB 14.3 15.3 15.2  HCT 41.1 43.9 43.8  MCV 93.8 93.8 92.6  MCH 32.6 32.7 32.1  MCHC 34.8 34.9 34.7  RDW 11.7 11.7 11.5  PLT 167 187 199   Cardiac EnzymesNo results for input(s): TROPONINI in the last 168 hours. No results for input(s): TROPIPOC in the last 168 hours.  BNP Recent Labs  Lab 11/10/20 2009  BNP 360.0*    DDimer  Recent Labs  Lab 11/10/20 2009  DDIMER 1.20*    Radiology/Studies:  CT Angio Chest PE W and/or  Wo Contrast  Result Date: 11/10/2020 CLINICAL DATA:  Cough and fever. EXAM: CT ANGIOGRAPHY CHEST WITH CONTRAST TECHNIQUE: Multidetector CT imaging of the chest was performed using the standard protocol during bolus administration of intravenous contrast. Multiplanar CT image reconstructions and MIPs were obtained to evaluate the vascular anatomy. CONTRAST:  OMNIPAQUE IOHEXOL 350 MG/ML SOLN COMPARISON:  None. FINDINGS: Cardiovascular: Satisfactory opacification of the pulmonary arteries to the segmental level. No evidence of pulmonary embolism. Normal heart size. No pericardial effusion. Mediastinum/Nodes: No enlarged mediastinal,  hilar, or axillary lymph nodes. Thyroid gland, trachea, and esophagus demonstrate no significant findings. Lungs/Pleura: Lungs are clear. No pleural effusion or pneumothorax. Upper Abdomen: No acute abnormality. Musculoskeletal: No chest wall abnormality. No acute or significant osseous findings. Review of the MIP images confirms the above findings. IMPRESSION: Negative examination for pulmonary embolism or active cardiopulmonary disease. Electronically Signed   By: Aram Candela M.D.   On: 11/10/2020 22:46   DG Chest Portable 1 View  Result Date: 11/10/2020 CLINICAL DATA:  Cough, fever.  Asthma. EXAM: PORTABLE CHEST 1 VIEW COMPARISON:  Chest x-ray 02/21/2014 FINDINGS: The heart size and mediastinal contours are within normal limits. No focal consolidation. Increased interstitial markings with no overt pulmonary edema. No pleural effusion. No pneumothorax. Apical lungs collimated off view due to overlying soft tissues and osseous structures. No acute osseous abnormality. IMPRESSION: 1. No active cardiopulmonary disease. 2. Apical lungs collimated off view due to overlying soft tissues and osseous structures. Electronically Signed   By: Tish Frederickson M.D.   On: 11/10/2020 19:40     Assessment & Plan    1. Abnormal EKG/Elevated Troponin Values - He presented with worsening dyspnea, coughing and wheezing over the past 3-4 days. Reports a history of chest pain but no recent symptoms.  - Initial HS Troponin 62 with repeat of 43. EKG shows NSR, HR 91 with prominent TWI along the inferior and lateral leads which is new when compared to prior tracings from 2017. D-dimer was elevated but CTA negative for PE.  - Would recommend obtaining an echocardiogram for initial assessment of EF and wall motion. He is at risk for alcohol-induced cardiomyopathy given his significant alcohol use over the past year. Further recommendations pending echo results but if EF not significantly reduced, would initially focus on  medical therapy initially and pursue an outpatient NST once respiratory status has improved. Would need to demonstrate medication compliance in the outpatient setting as well.   2. Accelerated HTN - BP initially elevated at 184/126 while in the ED but he had not been on medications for over a year. Previously on Amlodipine 10mg  daily, Losartan 25mg  daily, Atenolol 50mg  daily and Chlorthalidone 12.5mg  daily. He has been restarted on Amlodipine, Atenolol and Losartan. May require further medication adjustments pending echo results.   3. Type 2 DM - Hgb A1c elevated to 10.8 this admission. Further management per admitting team.   4. Tobacco Use/Alcohol Use - He reports consuming 3-4 half-gallons of liquor a week over the past year but says he has reduced his use. Cessation advised. I did notify the admitting team of his use of so he can be placed on CIWA protocol. LFT's were elevated and Abdominal is pending. Also smoking 2 ppd.    For questions or updates, please contact [facility] Please consult [email or web address] for contact info under Cardiology/STEMI.  Signed, , PA-C 11/11/2020, 10:07 AM Pager: [phone]   Attending note:  Patient seen and examined.  I reviewed his records  and discussed the case with Ms. Iran Ouch PA-C.  Christopher Cross presents with recent worsening shortness of breath and cough as well as wheezing.  Blood pressure uncontrolled at presentation and also noncompliant with medications for at least the last year.  He has a prior history of chest pain with negative ischemic work-up in 2015, no clearly documented obstructive CAD.  Patient reports regular excessive alcohol intake although trying to cut back recently.  Also continued tobacco abuse.  On examination this morning patient appears comfortable.  Blood pressure 145/100, heart rate 80 and in sinus rhythm by telemetry.  Lungs exhibit coarse breath sounds.  Cardiac exam with indistinct PMI, RRR, soft systolic  murmur.  Pertinent lab work includes potassium 4.0, BUN 12, creatinine 0.71, AST 74, ALT 139, hemoglobin 15.2, platelets 199, high-sensitivity troponin I levels of 62 and 43, BNP 360.  ECG significantly abnormal with deep T wave inversions inferiorly and anterolaterally.  Plan to follow-up on echocardiogram for assessment of LVEF, would be suspicious of cardiomyopathy, particularly in light of elevated blood pressure in the absence of medical therapy for a year, abnormal ECG is also concerning.  High-sensitivity troponin I levels are not however consistent with ACS.  Further recommendations to follow. Recommend CIWA protocol.  Jonelle Sidle, M.D., F.A.C.C.

## 2020-11-11 NOTE — ED Notes (Signed)
Patient ambulated around the nurses station on room air. Patient's 02 Sat 95-97 percent. Patient states that he does not feel short of breath at this time. Patient states that he feels really sleepy.

## 2020-11-11 NOTE — Progress Notes (Addendum)
PROGRESS NOTE  Christopher Cross RWE:[id number] DOB: 1966/04/09 DOA: 11/10/2020 PCP: Avon Gully, MD  Brief History:  54 year old male with a history of COPD, tobacco abuse, diabetes mellitus type 2, hypertension presenting with 3-day history of coughing, shortness of breath, and posttussive emesis.  Notably, the patient states that he has also been having some exertional dyspnea and exertional chest discomfort for the past week while working in the yard moving branches injuries.  He states that the chest discomfort lasts about a minute.  He denies any fevers, chills, hemoptysis, nausea, vomiting, diarrhea, abdominal pain.  Notably, the patient is currently unemployed and has lost his insurance.  He has been trying to stretch out his medications including his insulin.  He stated that he last used insulin about 1 week ago.  He previously used Hospital doctor 30 units at bedtime in addition to Metformin and Tradjenta.  In the emergency department, the patient was afebrile hemodynamically stable with oxygen saturation 95-97% on room air.  BMP showed sodium 133 with serum glucose 450.  AST 170, ALT 156, alkaline phosphatase 93, total bilirubin 0.7.  CBC was unremarkable.  CTA chest was negative for PE.  Lungs were clear.  Troponin was 62>> 43.  Cardiology was consulted to assist with management.  Assessment/Plan: COPD exacerbation -Start Pulmicort -Continue IV Solu-Medrol -Continue duo nebs -Personally reviewed chest x-ray--no consolidations or edema -11/10/2020 CTA chest--negative PE, clear lungs  Exertional chest discomfort/elevated troponin/Abnormal EKG -Personally reviewed EKG--sinus rhythm, significant T wave inversions diffusely -Echo EF 30-35%, +WMA, +apical thrombus -discussed with cardiology-->transfer to [facility] for heart cath -start IV heparin  Alcohol Abuse -drinking a "tumbler" of liquor daily (3-4 gallons per week previously) -CIWA  Tobacco abuse -Tobacco cessation  discussed -nicoderm patch  Uncontrolled diabetes mellitus type 2 with hyperglycemia -Hemoglobin A1c -Start 70/30 insulin 21 units twice daily -NovoLog sliding scale -holding metformin  Essential hypertension -Continue atenolol -Continue losartan  Hyperlipidemia -Holding  Atorvastatin temporarily due to elevated LFTs  Hyponatremia -Secondary to elevated glucose -continue IVF  Hypokalemia -Repleted -Magnesium 2.0  Transaminasemia -RUQ US--hepatic steatosis, otherwise negatvie -hep B surface antigen -hep C antibody -holding lipitor pending improvement      Status is: Observation  The patient will require care spanning > 2 midnights and should be moved to inpatient because: IV treatments appropriate due to intensity of illness or inability to take PO  Dispo: The patient is from: Home              Anticipated d/c is to: Home              Anticipated d/c date is: 2 days              Patient currently is not medically stable to d/c.        Family Communication:   No Family at bedside  Consultants:  cardiology  Code Status:  FULL  DVT Prophylaxis:  [place] Lovenox   Procedures: As Listed in Progress Note Above  Antibiotics: None      Subjective: Patient denies fevers, chills, headache, chest pain, dyspnea, nausea, vomiting, diarrhea, abdominal pain, dysuria, hematuria, hematochezia, and melena.   Objective: Vitals:   11/11/20 0430 11/11/20 0530 11/11/20 0600 11/11/20 0630  BP: (!) 134/106 (!) 140/113 (!) 134/93 (!) 139/93  Pulse: 72 76 75 69  Resp: 13 12 12 12   Temp:      TempSrc:      SpO2: 94% 96% 95% 97%  Weight:      Height:        Intake/Output Summary (Last 24 hours) at 11/11/2020 0811 Last data filed at 11/11/2020 0528 Gross per 24 hour  Intake --  Output 1200 ml  Net -1200 ml   Weight change:  Exam:   General:  Pt is alert, follows commands appropriately, not in acute distress  HEENT: No icterus, No thrush, No neck mass,  /AT  Cardiovascular: RRR, S1/S2, no rubs, no gallops  Respiratory: bibasilar rales.  Bilateral exp wheeze  Abdomen: Soft/+BS, non tender, non distended, no guarding  Extremities: No edema, No lymphangitis, No petechiae, No rashes, no synovitis   Data Reviewed: I have personally reviewed following labs and imaging studies Basic Metabolic Panel: Recent Labs  Lab 11/10/20 2009 11/10/20 2306 11/11/20 0428  NA 133*  --  132*  K 3.4*  --  3.9  CL 98  --  100  CO2 24  --  23  GLUCOSE 450*  --  426*  BUN 9  --  11  CREATININE 0.81  --  0.74  CALCIUM 8.6*  --  8.5*  MG  --  2.0  --   PHOS  --  3.0  --    Liver Function Tests: Recent Labs  Lab 11/10/20 2009 11/11/20 0428  AST 170* 94*  ALT 156* 148*  ALKPHOS 87 93  BILITOT 0.4 0.7  PROT 6.6 6.9  ALBUMIN 3.4* 3.6   No results for input(s): LIPASE, AMYLASE in the last 168 hours. No results for input(s): AMMONIA in the last 168 hours. Coagulation Profile: No results for input(s): INR, PROTIME in the last 168 hours. CBC: Recent Labs  Lab 11/10/20 2009 11/11/20 0428  WBC 7.9 6.9  NEUTROABS 4.6  --   HGB 14.3 15.3  HCT 41.1 43.9  MCV 93.8 93.8  PLT 167 187   Cardiac Enzymes: No results for input(s): CKTOTAL, CKMB, CKMBINDEX, TROPONINI in the last 168 hours. BNP: Invalid input(s): POCBNP CBG: Recent Labs  Lab 11/11/20 0051 11/11/20 0252 11/11/20 0522  GLUCAP 422* 437* 399*   HbA1C: No results for input(s): HGBA1C in the last 72 hours. Urine analysis:    Component Value Date/Time   COLORURINE STRAW (A) 11/11/2020 0400   APPEARANCEUR CLEAR 11/11/2020 0400   LABSPEC 1.038 (H) 11/11/2020 0400   PHURINE 7.0 11/11/2020 0400   GLUCOSEU >=500 (A) 11/11/2020 0400   HGBUR SMALL (A) 11/11/2020 0400   BILIRUBINUR NEGATIVE 11/11/2020 0400   KETONESUR 5 (A) 11/11/2020 0400   PROTEINUR NEGATIVE 11/11/2020 0400   NITRITE NEGATIVE 11/11/2020 0400   LEUKOCYTESUR NEGATIVE 11/11/2020 0400   Sepsis  Labs: @LABRCNTIP (procalcitonin:4,lacticidven:4) ) Recent Results (from the past 240 hour(s))  Resp Panel by RT-PCR (Flu A&B, Covid) Nasopharyngeal Swab     Status: None   Collection Time: 11/10/20  8:20 PM   Specimen: Nasopharyngeal Swab; Nasopharyngeal(NP) swabs in vial transport medium  Result Value Ref Range Status   SARS Coronavirus 2 by RT PCR NEGATIVE NEGATIVE Final    Comment: (NOTE) SARS-CoV-2 target nucleic acids are NOT DETECTED.  The SARS-CoV-2 RNA is generally detectable in upper respiratory specimens during the acute phase of infection. The lowest concentration of SARS-CoV-2 viral copies this assay can detect is 138 copies/mL. A negative result does not preclude SARS-Cov-2 infection and should not be used as the sole basis for treatment or other patient management decisions. A negative result may occur with  improper specimen collection/handling, submission of specimen other than nasopharyngeal swab, presence of  viral mutation(s) within the areas targeted by this assay, and inadequate number of viral copies(<138 copies/mL). A negative result must be combined with clinical observations, patient history, and epidemiological information. The expected result is Negative.  Fact Sheet for Patients:  BloggerCourse.com  Fact Sheet for Healthcare Providers:  SeriousBroker.it  This test is no t yet approved or cleared by the [place] FDA and  has been authorized for detection and/or diagnosis of SARS-CoV-2 by FDA under an Emergency Use Authorization (EUA). This EUA will remain  in effect (meaning this test can be used) for the duration of the COVID-19 declaration under Section 564(b)(1) of the Act, 21 U.S.C.section 360bbb-3(b)(1), unless the authorization is terminated  or revoked sooner.       Influenza A by PCR NEGATIVE NEGATIVE Final   Influenza B by PCR NEGATIVE NEGATIVE Final    Comment: (NOTE) The Xpert Xpress  SARS-CoV-2/FLU/RSV plus assay is intended as an aid in the diagnosis of influenza from Nasopharyngeal swab specimens and should not be used as a sole basis for treatment. Nasal washings and aspirates are unacceptable for Xpert Xpress SARS-CoV-2/FLU/RSV testing.  Fact Sheet for Patients: BloggerCourse.com  Fact Sheet for Healthcare Providers: SeriousBroker.it  This test is not yet approved or cleared by the [place] FDA and has been authorized for detection and/or diagnosis of SARS-CoV-2 by FDA under an Emergency Use Authorization (EUA). This EUA will remain in effect (meaning this test can be used) for the duration of the COVID-19 declaration under Section 564(b)(1) of the Act, 21 U.S.C. section 360bbb-3(b)(1), unless the authorization is terminated or revoked.  Performed at [facility], [address], [place], [place] [address]      Scheduled Meds: . amLODipine  10 mg Oral QPM  . aspirin EC  81 mg Oral QPM  . atenolol  50 mg Oral QPM  . enoxaparin (LOVENOX) injection  40 mg Subcutaneous Q24H  . insulin aspart  0-20 Units Subcutaneous TID WC  . insulin aspart protamine- aspart  21 Units Subcutaneous BID WC  . ipratropium-albuterol  3 mL Nebulization Q6H  . linagliptin  5 mg Oral Daily  . losartan  25 mg Oral QPM  . metFORMIN  1,000 mg Oral BID WC  . methylPREDNISolone (SOLU-MEDROL) injection  40 mg Intravenous Q6H   Followed by  . [START ON 11/12/2020] predniSONE  40 mg Oral Q breakfast   Continuous Infusions:  Procedures/Studies: CT Angio Chest PE W and/or Wo Contrast  Result Date: 11/10/2020 CLINICAL DATA:  Cough and fever. EXAM: CT ANGIOGRAPHY CHEST WITH CONTRAST TECHNIQUE: Multidetector CT imaging of the chest was performed using the standard protocol during bolus administration of intravenous contrast. Multiplanar CT image reconstructions and MIPs were obtained to evaluate the vascular anatomy. CONTRAST:   OMNIPAQUE IOHEXOL 350 MG/ML SOLN COMPARISON:  None. FINDINGS: Cardiovascular: Satisfactory opacification of the pulmonary arteries to the segmental level. No evidence of pulmonary embolism. Normal heart size. No pericardial effusion. Mediastinum/Nodes: No enlarged mediastinal, hilar, or axillary lymph nodes. Thyroid gland, trachea, and esophagus demonstrate no significant findings. Lungs/Pleura: Lungs are clear. No pleural effusion or pneumothorax. Upper Abdomen: No acute abnormality. Musculoskeletal: No chest wall abnormality. No acute or significant osseous findings. Review of the MIP images confirms the above findings. IMPRESSION: Negative examination for pulmonary embolism or active cardiopulmonary disease. Electronically Signed   By: Aram Candela M.D.   On: 11/10/2020 22:46   DG Chest Portable 1 View  Result Date: 11/10/2020 CLINICAL DATA:  Cough, fever.  Asthma. EXAM: PORTABLE  CHEST 1 VIEW COMPARISON:  Chest x-ray 02/21/2014 FINDINGS: The heart size and mediastinal contours are within normal limits. No focal consolidation. Increased interstitial markings with no overt pulmonary edema. No pleural effusion. No pneumothorax. Apical lungs collimated off view due to overlying soft tissues and osseous structures. No acute osseous abnormality. IMPRESSION: 1. No active cardiopulmonary disease. 2. Apical lungs collimated off view due to overlying soft tissues and osseous structures. Electronically Signed   By: Tish Frederickson M.D.   On: 11/10/2020 19:40    Catarina Hartshorn, DO  [facility]  If 7PM-7AM, please contact night-coverage [email or web address] Password [email or web address] 11/11/2020, 8:11 AM   LOS: 0 days

## 2020-11-11 NOTE — Progress Notes (Signed)
Pt arrived to unit from ED. Oriented to room and safety procedures. Pt currently denies c/o. B/P 145/102, verified in both arms. MD Tat notified. [place], [place] with Cardiology in to evaluate pt at this time and also notified of current B/P.

## 2020-11-11 NOTE — Progress Notes (Addendum)
Inpatient Diabetes Program Recommendations  AACE/ADA: New Consensus Statement on Inpatient Glycemic Control   Target Ranges:  Prepandial:   less than 140 mg/dL      Peak postprandial:   less than 180 mg/dL (1-2 hours)      Critically ill patients:  140 - 180 mg/dL  Results for Hacking, JOSEMANUEL EAKINS (MRN [id number]) as of 11/11/2020 07:27  Ref. Range 11/11/2020 00:51 11/11/2020 02:52 11/11/2020 05:22  Glucose-Capillary Latest Ref Range: 70 - 99 mg/dL 829 (H) 562 (H) 130 (H)  Results for PAGEChia, Rock (MRN [id number]) as of 11/11/2020 07:27  Ref. Range 11/10/2020 20:09  Glucose Latest Ref Range: 70 - 99 mg/dL 295 (H)   Results for PAGEYale, Golla (MRN [id number]) as of 11/11/2020 13:56  Ref. Range 11/10/2020 20:09  Hemoglobin A1C Latest Ref Range: 4.8 - 5.6 % 10.8 (H)   Review of Glycemic Control  Diabetes history: DM2 Outpatient Diabetes medications: Basaglar 30 units QHS, Metformin 1000 mg QPM, Januvia 100 mg QPM Current orders for Inpatient glycemic control: Lantus 30 units QHS, Novolog 0-20 units TID with meals, Metformin 1000 mg BID, Tradjenta 5 mg daily; Solumedrol 40 mg Q6H (ending 11/11/20 at 23:59), Prednisone 40 mg QAM(starting 11/12/20)  Inpatient Diabetes Program Recommendations:    DM medications: Would recommend discontinuing Tradjenta since it is expensive and patient has no insurance to cover cost. Would recommend changing patient to Novolin 70/30 insulin which would be more affordable given no insurance coverage. If MD agreeable, would discontinue Lantus and order 70/30 21 units BID to start tomorrow morning with breakfast (dose would provide a total of 29.4 units for basal and 12.6 units for meal coverage per day).  NOTE: Per chart, patient lost his job and insurance earlier this year and he has not been able to afford medications. Patient was taking Basaglar, Januvia and Metformin for DM outpatient. Would recommend changing insulin to Novolin 70/30 (which is $25 per vial or $43 per  box of 5 insulin pens) and would continue Metformin outpatient as well. Patient received Lantus 30 units at 6:00 am today. IF MD is agreeable with changing insulin to 70/30 and wants to start inpatient, would discontinue Lantus and order 70/30 21 units BID to start in the morning 11/12/20 with breakfast. Will plan to talk with patient today.  Addendum 11/11/20@13 :50: Spoke with patient about diabetes and home regimen for diabetes control. Patient reports he was seeing Dr. Felecia Shelling but has no seen him since he lost his job and insurance. Patient is not sure he will be able to afford to see PCP. Patient is interested in getting established with local clinic for follow up and would appreciate any medication assistance.  Patient states that he was taking Basaglar 30 units QHS and Metformin 1000 mg once a day (has excessive diarrhea with taking 1000 mg BID).  Patient states that he has not used Januvia since he ran out initially after losing insurance as the cash price was over $400 to get it filled. Patient reports that he has a glucometer at home but he will need to buy test strips again soon as he does not have many left. Discussed A1C results (10.7% on 11/10/20 ) and explained that current A1C indicates an average glucose of 263 mg/dl over the past 2-3 months. Discussed glucose and A1C goals. Discussed importance of checking CBGs and maintaining good CBG control to prevent long-term and short-term complications. Explained how hyperglycemia leads to damage within blood vessels which lead to the common complications seen  with uncontrolled diabetes. Stressed to the patient the importance of improving glycemic control to prevent further complications from uncontrolled diabetes. Discussed impact of nutrition, exercise, stress, sickness, and medications on diabetes control.  Discussed carbohydrate modified diet and patient reports that he does not really follow a carbohydrate modified diet.  Discussed Novolin insulins (NPH,  Regular, and 70/30). Explained that Novolin 70/30 is $25 per vial or $43 per box of 5 insulin pens at [facility]. Discussed how Novolin 70/30 insulin works and how it is typically taken BID with breakfast and supper.  Patient states that he would prefer to use the Novolin 70/30 insulin pens and that he felt they would be much more affordable for him. Patient is also willing to continue taking Metformin 1000 mg once a day (does not tolerate 1000 mg BID).  Encouraged patient to check glucose 3-4 times per day, take medication as prescribed, follow up with provider consistently.  Patient verbalized understanding of information discussed and reports no further questions at this time related to diabetes.  Thanks, Orlando Penner, RN, MSN, CDE Diabetes Coordinator Inpatient Diabetes Program [phone] (Team Pager from 8am to 5pm)

## 2020-11-11 NOTE — Progress Notes (Signed)
Pt transferred to [facility] via [facility] with all belongings in his possession.

## 2020-11-11 NOTE — ED Notes (Signed)
Patient on 12 lead. EKG done 

## 2020-11-11 NOTE — Progress Notes (Signed)
   Patient's echocardiogram resulted and shows a reduced EF of 30-35% with WMA and possibly stress-induced but cannot rule-out an ischemic etiology. Also noted to have an apical LV thrombus. Reviewed with Dr. Diona Browner and will start Heparin and anticipate obtaining a cardiac catheterization on 11/13/2020.  For this cardiomyopathy, he is already on Losartan 25mg  daily and this could be transitioned to [facility] following his cath (he reports not currently having insurance coverage). Will stop Atenolol and switch to Toprol-XL. Will stop Amlodipine to allow for titration of medical therapy for his cardiomyopathy.  Reviewed echo results and recommendations for cath with the patient and his father. The patient understands that risks include but are not limited to stroke (1 in 1000), death (1 in 1000), kidney failure [usually temporary] (1 in 500), bleeding (1 in 200), allergic reaction [possibly serious] (1 in 200).    Hospitalist team will work on arranging transfer. Added to the cath board for Friday. Cannot be performed today as the patient had two [employer] cheeseburgers less than 30 minutes ago.   Signed, Tuesday, PA-C 11/11/2020, 12:18 PM Pager: [phone]

## 2020-11-11 NOTE — H&P (Signed)
History and Physical    Christopher Cross XBJ:[id number] DOB: Apr 19, 1966 DOA: 11/10/2020  PCP: Avon Gully, MD  Patient coming from: Home.  I have personally briefly reviewed patient's old medical records in [facility]  Chief Complaint: Shortness of breath and cough.  HPI: Christopher Cross is a 54 y.o. male with medical history significant of asthma/emphysema, active tobacco use, hyperlipidemia, type 2 diabetes, essential hypertension who is coming to the emergency department due to progressively worse shortness of breath for several days associated with severe cough that has made him vomit and subjective fever since yesterday.  He denies rhinorrhea, sore throat, hemoptysis, palpitations, dizziness, diaphoresis, PND or orthopnea.  Denies abdominal pain, diarrhea, constipation, melena or hematochezia.  No dysuria, frequency or hematuria.  Positive polydipsia, polyuria, polyphagia and blurred vision.  He also mentions that earlier this year he lost his job and [employer], applications.  He has not being able to take his medications and insulin as prescribed.  The last time he uses prescriptions and insulin work last week.  ED Course: Initial vital signs were temperature 98.1 F, pulse 100, respiration 18, BP 167/103 mmHg O2 sat 97% on room air.  Labs: His urinalysis show increase of specific gravity, glucosuria more than 500 and ketonuria 5 mg/dL.  There was small hemoglobinuria.  Microscopic examination was unremarkable.  SARS 2 and influenza PCR was negative.  CBC was normal.  D-dimer was 1.20 pg/mL.  Troponin was 62 and then 43 ng/L.  BNP was 360.0 pg/mL.  Sodium 133 and potassium 3.4 mmol/L.  All other electrolytes are normal when calcium is corrected to albumin.  Renal function was within expected range.  Glucose 450 mg/dL.  LFTs show a total protein of 6.6 and albumin at 3.4 g/dL.  AST was 117 ALT 156 units/L.  Imaging: 1 view chest radiograph did not show any active cardiopulmonary disease.  Heart  size and mediastinal contours were within normal limits.  CTA chest did not demonstrate a PE or active cardiopulmonary disease.  Please see images and full radiology report for further detail.  Review of Systems: As per HPI otherwise all other systems reviewed and are negative.  Past Medical History:  Diagnosis Date  . Asthma   . Diabetes mellitus without complication (HCC)   . Other emphysema (HCC)   . Unspecified essential hypertension    History reviewed. No pertinent surgical history.  Social History  reports that he has been smoking cigarettes. He started smoking about 4 years ago. He has been smoking about 2.00 packs per day. He has never used smokeless tobacco. He reports current alcohol use. He reports that he does not use drugs.  No Known Allergies  Family History  Problem Relation Age of Onset  . Heart attack Other        Grandfather  . Heart attack Other        Uncle   Prior to Admission medications   Medication Sig Start Date End Date Taking? Authorizing Provider  acetaminophen (TYLENOL) 500 MG tablet Take 1,000 mg every 6 (six) hours as needed by mouth for headache.    [provider]  amLODipine (NORVASC) 10 MG tablet Take 1 tablet (10 mg total) by mouth daily. Patient taking differently: Take 10 mg by mouth every evening.  11/23/17   Antoine Poche, MD  aspirin EC 81 MG tablet Take 1 tablet (81 mg total) by mouth daily. Patient taking differently: Take 81 mg by mouth every evening.  08/15/14   Branch,  Dorothe Pea, MD  Aspirin-Salicylamide-Caffeine [facility] HEADACHE POWDER PO) Take 1 packet daily as needed by mouth (headache).    [provider]  atenolol (TENORMIN) 50 MG tablet TAKE 1 TABLET BY MOUTH DAILY. Patient taking differently: Take 50 mg by mouth every evening.  11/13/17   Antoine Poche, MD  atorvastatin (LIPITOR) 40 MG tablet Take 40 mg by mouth every evening.  07/02/20   [provider]  calcium carbonate (TUMS - DOSED IN MG  ELEMENTAL CALCIUM) 500 MG chewable tablet Chew 2 tablets daily as needed by mouth for indigestion or heartburn.    [provider]  chlorthalidone (HYGROTON) 25 MG tablet TAKE 1/2 TABLET BY MOUTH EVERY DAY Patient taking differently: Take 12.5 mg by mouth every evening.  01/09/17   Antoine Poche, MD  ibuprofen (ADVIL,MOTRIN) 200 MG tablet Take 400 mg every 6 (six) hours as needed by mouth for headache.    [provider]  Insulin Glargine (BASAGLAR KWIKPEN) 100 UNIT/ML Inject 30 Units into the skin at bedtime. 06/26/20   [provider]  losartan (COZAAR) 25 MG tablet Take 25 mg by mouth every evening.  06/01/20   [provider]  metFORMIN (GLUCOPHAGE) 1000 MG tablet Take 1 tablet (1,000 mg total) 2 (two) times daily by mouth. Patient taking differently: Take 2,000 mg by mouth every evening.  10/22/17   Rolland Porter, MD  Multiple Vitamin (MULTIVITAMIN WITH MINERALS) TABS tablet Take 1 tablet by mouth every evening.     [provider]  nitroGLYCERIN (NITROSTAT) 0.4 MG SL tablet Place 0.4 mg under the tongue every 5 (five) minutes as needed for chest pain.    [provider]  ondansetron (ZOFRAN ODT) 4 MG disintegrating tablet Take 1 tablet (4 mg total) by mouth every 8 (eight) hours as needed for up to 10 doses for nausea or vomiting. 07/22/20   Sabino Donovan, MD  oxyCODONE-acetaminophen (PERCOCET/ROXICET) 5-325 MG tablet Take 2 tablets by mouth every 6 (six) hours as needed for up to 12 doses for severe pain. 07/22/20   Sabino Donovan, MD  PROAIR HFA 108 [phone] Base) MCG/ACT inhaler INHALE 1 TO 2 PUFFS EVERY SIX HOURS AS NEEDED FOR WHEEZING OR SHORTNESS OF BREATH Patient taking differently: Inhale 1-2 puffs into the lungs every 6 (six) hours as needed for wheezing or shortness of breath.  10/24/16   Antoine Poche, MD  sildenafil (VIAGRA) 100 MG tablet TAKE 1/2 TAB 30 MINS PRIOR Patient taking differently: Take 50 mg by mouth as needed for erectile  dysfunction.  03/01/16   Antoine Poche, MD  sitaGLIPtin (JANUVIA) 100 MG tablet Take 100 mg by mouth every evening.  04/16/18   [provider]  SPIRIVA HANDIHALER 18 MCG inhalation capsule Place 1 capsule into inhaler and inhale daily. 06/01/20   [provider]   Physical Exam: Vitals:   11/11/20 0030 11/11/20 0200 11/11/20 0247 11/11/20 0300  BP: (!) 149/104 (!) 135/104  (!) 153/96  Pulse: 81 77  71  Resp: 14 12  16   Temp:      TempSrc:      SpO2: 95% 95% 97% 97%  Weight:      Height:       Constitutional: NAD, calm, comfortable Eyes: PERRL, lids and conjunctivae normal ENMT: Mucous membranes are dry. Posterior pharynx clear of any exudate or lesions. Neck: normal, supple, no masses, no thyromegaly Respiratory: Decreased breath sounds bilaterally with rhonchi and wheezing, no crackles. Normal respiratory effort. No  accessory muscle use.  Cardiovascular: Regular rate and rhythm, no murmurs / rubs / gallops. No extremity edema. 2+ pedal pulses. No carotid bruits.  Abdomen: Obese, nondistended.  Bowel sounds positive.  Soft, no tenderness, no masses palpated. No hepatosplenomegaly. Musculoskeletal: no clubbing / cyanosis.  Good ROM, no contractures. Normal muscle tone.  Skin: no rashes, lesions, ulcers on very limited dermatological examination. Neurologic: CN 2-12 grossly intact. Sensation intact, DTR normal. Strength 5/5 in all 4.  Psychiatric: Normal judgment and insight. Alert and oriented x 3. Normal mood.   Labs on Admission: I have personally reviewed following labs and imaging studies  CBC: Recent Labs  Lab 11/10/20 2009  WBC 7.9  NEUTROABS 4.6  HGB 14.3  HCT 41.1  MCV 93.8  PLT 167   Basic Metabolic Panel: Recent Labs  Lab 11/10/20 2009 11/10/20 2306  NA 133*  --   K 3.4*  --   CL 98  --   CO2 24  --   GLUCOSE 450*  --   BUN 9  --   CREATININE 0.81  --   CALCIUM 8.6*  --   MG  --  2.0  PHOS  --  3.0   GFR: Estimated Creatinine  Clearance: 118.8 mL/min (by C-G formula based on SCr of 0.81 mg/dL).  Liver Function Tests: Recent Labs  Lab 11/10/20 2009  AST 170*  ALT 156*  ALKPHOS 87  BILITOT 0.4  PROT 6.6  ALBUMIN 3.4*   Radiological Exams on Admission: CT Angio Chest PE W and/or Wo Contrast  Result Date: 11/10/2020 CLINICAL DATA:  Cough and fever. EXAM: CT ANGIOGRAPHY CHEST WITH CONTRAST TECHNIQUE: Multidetector CT imaging of the chest was performed using the standard protocol during bolus administration of intravenous contrast. Multiplanar CT image reconstructions and MIPs were obtained to evaluate the vascular anatomy. CONTRAST:  OMNIPAQUE IOHEXOL 350 MG/ML SOLN COMPARISON:  None. FINDINGS: Cardiovascular: Satisfactory opacification of the pulmonary arteries to the segmental level. No evidence of pulmonary embolism. Normal heart size. No pericardial effusion. Mediastinum/Nodes: No enlarged mediastinal, hilar, or axillary lymph nodes. Thyroid gland, trachea, and esophagus demonstrate no significant findings. Lungs/Pleura: Lungs are clear. No pleural effusion or pneumothorax. Upper Abdomen: No acute abnormality. Musculoskeletal: No chest wall abnormality. No acute or significant osseous findings. Review of the MIP images confirms the above findings. IMPRESSION: Negative examination for pulmonary embolism or active cardiopulmonary disease. Electronically Signed   By: Aram Candela M.D.   On: 11/10/2020 22:46   DG Chest Portable 1 View  Result Date: 11/10/2020 CLINICAL DATA:  Cough, fever.  Asthma. EXAM: PORTABLE CHEST 1 VIEW COMPARISON:  Chest x-ray 02/21/2014 FINDINGS: The heart size and mediastinal contours are within normal limits. No focal consolidation. Increased interstitial markings with no overt pulmonary edema. No pleural effusion. No pneumothorax. Apical lungs collimated off view due to overlying soft tissues and osseous structures. No acute osseous abnormality. IMPRESSION: 1. No active  cardiopulmonary disease. 2. Apical lungs collimated off view due to overlying soft tissues and osseous structures. Electronically Signed   By: Tish Frederickson M.D.   On: 11/10/2020 19:40   EKG: Independently reviewed. Vent. rate 79 BPM PR interval * ms QRS duration 112 ms QT/QTc 483/554 ms P-R-T axes 56 241 230 Sinus rhythm IRBBB and LPFB Repol abnrm suggests ischemia, lateral leads Prolonged QT interval Baseline wander in lead(s) V3  Assessment/Plan Principal Problem:   COPD exacerbation (HCC) Observation/telemetry. Continue supplemental oxygen. DuoNeb every 6 hours. Albuterol 2.5 mg via neb every 4 hours PRN.  Solu-Medrol 40 mg every 6 hours x 3 doses. Monitor closely and correct glucose PRN  Active Problems:   Uncontrolled type 2 diabetes mellitus with hypoglycemia (HCC) Secondary to DM 2 treatment noncompliance. Also received glucocorticoids for COPD exacerbation. Carbohydrate modified diet. Resume home Metformin and Januvia. CBG monitoring without ISS.    Hypokalemia Replacing. Magnesium has been supplemented.    Elevated troponin Cardiology note appreciated. Check echocardiogram. Consult cardiology in a.m.    Prolonged QT interval Magnesium and potassium have been supplemented. Continue cardiac telemetry. Avoid medications that may prolong QT interval.    HTN (hypertension) Continue atenolol 50 mg p.o. twice daily. Continue losartan 25 mg p.o. every evening. Continue amlodipine 10 mg p.o. every morning. Monitor BP, heart rate, renal function electrolytes.    Tobacco use Smoking cessation advised.    DVT prophylaxis: Lovenox SQ. Code Status:   Full code. Family Communication: Disposition Plan:   Patient is from:  Home.  Anticipated DC to:  Home.  Anticipated DC date:  11/12/2020.  Anticipated DC barriers: Clinical improvement.  Consults called: Admission status:  Observation/telemetry.   Severity of Illness: High in the setting of COPD  exacerbation with dyspnea, tachypnea and hypoxia along with uncontrolled type II DM with hyperglycemia due to treatment noncompliance.  The patient will need 24 to 48 hours to treat these conditions.  Bobette Mo MD [facility]  How to contact the [facility] Attending or Consulting provider 7A - 7P or covering provider during after hours 7P -7A, for this patient?   1. Check the care team in [facility] and look for a) attending/consulting [facility] provider listed and b) the [facility] team listed 2. Log into [email or web address] and use [facility]'s universal password to access. If you do not have the password, please contact the hospital operator. 3. Locate the [facility] provider you are looking for under [facility] and [place] to a number that you can be directly reached. 4. If you still have difficulty reaching the provider, please Frit[facility] the [facility] (Director on Call) for the Hospitalists listed on amion for assistance.  11/11/2020, 4:09 AM   This document was prepared using Dragon voice recognition software and may contain some unintended transcription errors.

## 2020-11-12 ENCOUNTER — Inpatient Hospital Stay (HOSPITAL_COMMUNITY): Payer: Self-pay

## 2020-11-12 DIAGNOSIS — J441 Chronic obstructive pulmonary disease with (acute) exacerbation: Secondary | ICD-10-CM

## 2020-11-12 DIAGNOSIS — R778 Other specified abnormalities of plasma proteins: Secondary | ICD-10-CM

## 2020-11-12 LAB — HEPATIC FUNCTION PANEL
ALT: 125 U/L — ABNORMAL HIGH (ref 0–44)
AST: 58 U/L — ABNORMAL HIGH (ref 15–41)
Albumin: 3 g/dL — ABNORMAL LOW (ref 3.5–5.0)
Alkaline Phosphatase: 68 U/L (ref 38–126)
Bilirubin, Direct: <0.1 mg/dL (ref 0.0–0.2)
Total Bilirubin: 0.5 mg/dL (ref 0.3–1.2)
Total Protein: 5.7 g/dL — ABNORMAL LOW (ref 6.5–8.1)

## 2020-11-12 LAB — CBC
HCT: 39.3 % (ref 39.0–52.0)
Hemoglobin: 13.6 g/dL (ref 13.0–17.0)
MCH: 32.4 pg (ref 26.0–34.0)
MCHC: 34.6 g/dL (ref 30.0–36.0)
MCV: 93.6 fL (ref 80.0–100.0)
Platelets: 180 10*3/uL (ref 150–400)
RBC: 4.2 MIL/uL — ABNORMAL LOW (ref 4.22–5.81)
RDW: 11.9 % (ref 11.5–15.5)
WBC: 13 10*3/uL — ABNORMAL HIGH (ref 4.0–10.5)
nRBC: 0 % (ref 0.0–0.2)

## 2020-11-12 LAB — GLUCOSE, CAPILLARY
Glucose-Capillary: 415 mg/dL — ABNORMAL HIGH (ref 70–99)
Glucose-Capillary: 397 mg/dL — ABNORMAL HIGH (ref 70–99)
Glucose-Capillary: 495 mg/dL — ABNORMAL HIGH (ref 70–99)

## 2020-11-12 LAB — HEPATITIS C ANTIBODY: HCV Ab: NONREACTIVE

## 2020-11-12 LAB — BASIC METABOLIC PANEL
Anion gap: 10 (ref 5–15)
BUN: 16 mg/dL (ref 6–20)
CO2: 23 mmol/L (ref 22–32)
Calcium: 8.5 mg/dL — ABNORMAL LOW (ref 8.9–10.3)
Chloride: 104 mmol/L (ref 98–111)
Creatinine, Ser: 0.67 mg/dL (ref 0.61–1.24)
GFR, Estimated: >60 mL/min (ref >60)
Glucose, Bld: 362 mg/dL — ABNORMAL HIGH (ref 70–99)
Potassium: 4 mmol/L (ref 3.5–5.1)
Sodium: 137 mmol/L (ref 135–145)

## 2020-11-12 LAB — LIPID PANEL
Cholesterol: 152 mg/dL (ref 0–200)
HDL: 55 mg/dL (ref >40)
LDL Cholesterol: 85 mg/dL (ref 0–99)
Total CHOL/HDL Ratio: 2.8 RATIO
Triglycerides: 62 mg/dL (ref <150)
VLDL: 12 mg/dL (ref 0–40)

## 2020-11-12 LAB — HEPARIN LEVEL (UNFRACTIONATED): Heparin Unfractionated: 0.51 IU/mL (ref 0.30–0.70)

## 2020-11-12 LAB — HEPATITIS B SURFACE ANTIGEN: Hepatitis B Surface Ag: NONREACTIVE

## 2020-11-12 MED ORDER — POTASSIUM CHLORIDE CRYS ER 20 MEQ PO TBCR
20.0000 meq | EXTENDED_RELEASE_TABLET | Freq: Once | ORAL | Status: AC
  Administered 2020-11-12: 20 meq via ORAL
  Filled 2020-11-12: qty 1

## 2020-11-12 MED ORDER — INSULIN ASPART 100 UNIT/ML ~~LOC~~ SOLN
12.0000 [IU] | Freq: Once | SUBCUTANEOUS | Status: AC
  Administered 2020-11-12: 12 [IU] via SUBCUTANEOUS

## 2020-11-12 MED ORDER — METHYLPREDNISOLONE SODIUM SUCC 40 MG IJ SOLR
40.0000 mg | Freq: Two times a day (BID) | INTRAMUSCULAR | Status: DC
  Administered 2020-11-12 – 2020-11-13 (×3): 40 mg via INTRAVENOUS
  Filled 2020-11-12 (×3): qty 1

## 2020-11-12 MED ORDER — IPRATROPIUM-ALBUTEROL 0.5-2.5 (3) MG/3ML IN SOLN
3.0000 mL | RESPIRATORY_TRACT | Status: DC
  Administered 2020-11-12 (×3): 3 mL via RESPIRATORY_TRACT
  Filled 2020-11-12 (×3): qty 3

## 2020-11-12 MED ORDER — LOSARTAN POTASSIUM 25 MG PO TABS
25.0000 mg | ORAL_TABLET | Freq: Every evening | ORAL | Status: DC
  Administered 2020-11-12 – 2020-11-13 (×2): 25 mg via ORAL
  Filled 2020-11-12 (×3): qty 1

## 2020-11-12 MED ORDER — IPRATROPIUM-ALBUTEROL 0.5-2.5 (3) MG/3ML IN SOLN
3.0000 mL | Freq: Two times a day (BID) | RESPIRATORY_TRACT | Status: DC
  Administered 2020-11-13 – 2020-11-14 (×3): 3 mL via RESPIRATORY_TRACT
  Filled 2020-11-12 (×3): qty 3

## 2020-11-12 MED ORDER — FUROSEMIDE 10 MG/ML IJ SOLN
40.0000 mg | Freq: Once | INTRAMUSCULAR | Status: AC
  Administered 2020-11-12: 40 mg via INTRAVENOUS
  Filled 2020-11-12: qty 4

## 2020-11-12 MED ORDER — ALBUTEROL SULFATE (2.5 MG/3ML) 0.083% IN NEBU: 2.5000 mg | INHALATION_SOLUTION | RESPIRATORY_TRACT | Status: DC | PRN

## 2020-11-12 NOTE — Progress Notes (Addendum)
Progress Note  Patient Name: Christopher Cross Date of Encounter: 11/12/2020  [facility] Cardiologist: Dina Rich, MD   Subjective   54 year old gentleman with history of hypertension, type 2 diabetes mellitus, COPD, alcohol and tobacco abuse.  He was seen up in the [place] at any [facility] for further evaluation of elevated troponin levels and an abnormal EKG.  EKG performed yesterday reveals moderate to severe LV dysfunction with an ejection fraction of 30 to 35%.  The echo revealed an apical mass c/w  apical thrombus.    Inpatient Medications    Scheduled Meds: . [START ON 11/13/2020] aspirin  81 mg Oral Pre-Cath  . aspirin EC  81 mg Oral QPM  . budesonide (PULMICORT) nebulizer solution  0.5 mg Nebulization BID  . folic acid  1 mg Oral Daily  . influenza vac split quadrivalent PF  0.5 mL Intramuscular Tomorrow-1000  . insulin aspart  0-20 Units Subcutaneous TID WC  . insulin aspart protamine- aspart  30 Units Subcutaneous BID WC  . ipratropium-albuterol  3 mL Nebulization TID  . losartan  25 mg Oral QPM  . methylPREDNISolone (SOLU-MEDROL) injection  40 mg Intravenous Q12H  . metoprolol succinate  25 mg Oral Daily  . multivitamin with minerals  1 tablet Oral Daily  . pneumococcal 23 valent vaccine  0.5 mL Intramuscular Tomorrow-1000  . sodium chloride flush  3 mL Intravenous Q12H  . thiamine  100 mg Oral Daily   Or  . thiamine  100 mg Intravenous Daily   Continuous Infusions: . sodium chloride    . [START ON 11/13/2020] sodium chloride    . heparin 1,400 Units/hr (11/12/20 0356)   PRN Meds: sodium chloride, acetaminophen **OR** acetaminophen, albuterol, LORazepam **OR** LORazepam, nicotine, nitroGLYCERIN, prochlorperazine, sodium chloride flush   Vital Signs    Vitals:   11/12/20 0520 11/12/20 0637 11/12/20 0758 11/12/20 0844  BP: (!) 143/102 (!) 139/99  (!) 150/105  Pulse: 83 87  91  Resp: 17 (!) 21  20  Temp: 97.9 F (36.6 C)   98.2 F (36.8  C)  TempSrc: Oral   Oral  SpO2: 95% 96% 99% 96%  Weight: 91.6 kg     Height:        Intake/Output Summary (Last 24 hours) at 11/12/2020 0905 Last data filed at 11/11/2020 1500 Gross per 24 hour  Intake 426 ml  Output --  Net 426 ml   Last 3 Weights 11/12/2020 11/11/2020 11/10/2020  Weight (lbs) 201 lb 15.1 oz 204 lb 1.6 oz 200 lb  Weight (kg) 91.6 kg 92.579 kg 90.719 kg      Telemetry     sinus rhythm- Personally Reviewed  ECG    NSR , diffuse marked TWI  - Personally Reviewed  Physical Exam   GEN: No acute distress.   Neck: No JVD Cardiac: RRR .   Respiratory: bilateral wheezing  GI: Soft, nontender, non-distended  MS: No edema; No deformity. Neuro:  Nonfocal  Psych: Normal affect   Labs    High Sensitivity Troponin:   Recent Labs  Lab 11/10/20 2009 11/10/20 2306  TROPONINIHS 62* 43*      Chemistry Recent Labs  Lab 11/11/20 0428 11/11/20 0428 11/11/20 0935 11/11/20 1813 11/12/20 0000  NA 132*  --  134*  --  137  K 3.9  --  4.0  --  4.0  CL 100  --  103  --  104  CO2 23  --  23  --  23  GLUCOSE 426*   < > 322* 334* 362*  BUN 11  --  12  --  16  CREATININE 0.74  --  0.71  --  0.67  CALCIUM 8.5*  --  8.5*  --  8.5*  PROT 6.9  --  6.9  --  5.7*  ALBUMIN 3.6  --  3.6  --  3.0*  AST 94*  --  74*  --  58*  ALT 148*  --  139*  --  125*  ALKPHOS 93  --  88  --  68  BILITOT 0.7  --  0.4  --  0.5  GFRNONAA >60  --  >60  --  >60  ANIONGAP 9  --  8  --  10   < > = values in this interval not displayed.     Hematology Recent Labs  Lab 11/11/20 0428 11/11/20 0935 11/12/20 0000  WBC 6.9 12.8* 13.0*  RBC 4.68 4.73 4.20*  HGB 15.3 15.2 13.6  HCT 43.9 43.8 39.3  MCV 93.8 92.6 93.6  MCH 32.7 32.1 32.4  MCHC 34.9 34.7 34.6  RDW 11.7 11.5 11.9  PLT 187 199 180    BNP Recent Labs  Lab 11/10/20 2009  BNP 360.0*     DDimer  Recent Labs  Lab 11/10/20 2009  DDIMER 1.20*     Radiology    CT Angio Chest PE W and/or Wo  Contrast  Result Date: 11/10/2020 CLINICAL DATA:  Cough and fever. EXAM: CT ANGIOGRAPHY CHEST WITH CONTRAST TECHNIQUE: Multidetector CT imaging of the chest was performed using the standard protocol during bolus administration of intravenous contrast. Multiplanar CT image reconstructions and MIPs were obtained to evaluate the vascular anatomy. CONTRAST:  OMNIPAQUE IOHEXOL 350 MG/ML SOLN COMPARISON:  None. FINDINGS: Cardiovascular: Satisfactory opacification of the pulmonary arteries to the segmental level. No evidence of pulmonary embolism. Normal heart size. No pericardial effusion. Mediastinum/Nodes: No enlarged mediastinal, hilar, or axillary lymph nodes. Thyroid gland, trachea, and esophagus demonstrate no significant findings. Lungs/Pleura: Lungs are clear. No pleural effusion or pneumothorax. Upper Abdomen: No acute abnormality. Musculoskeletal: No chest wall abnormality. No acute or significant osseous findings. Review of the MIP images confirms the above findings. IMPRESSION: Negative examination for pulmonary embolism or active cardiopulmonary disease. Electronically Signed   By: Aram Candela M.D.   On: 11/10/2020 22:46   DG CHEST PORT 1 VIEW  Result Date: 11/12/2020 CLINICAL DATA:  Cough, fever and asthma. EXAM: PORTABLE CHEST 1 VIEW COMPARISON:  11/10/2020 FINDINGS: The heart size and mediastinal contours are within normal limits. There is mild increase interstitial markings noted peripherally suggesting mild edema. No superimposed airspace consolidation identified. The visualized skeletal structures are unremarkable. IMPRESSION: 1. Mild interstitial edema. 2. No airspace consolidation or pleural effusion. Electronically Signed   By: Signa Kell M.D.   On: 11/12/2020 08:55   DG Chest Portable 1 View  Result Date: 11/10/2020 CLINICAL DATA:  Cough, fever.  Asthma. EXAM: PORTABLE CHEST 1 VIEW COMPARISON:  Chest x-ray 02/21/2014 FINDINGS: The heart size and mediastinal contours are  within normal limits. No focal consolidation. Increased interstitial markings with no overt pulmonary edema. No pleural effusion. No pneumothorax. Apical lungs collimated off view due to overlying soft tissues and osseous structures. No acute osseous abnormality. IMPRESSION: 1. No active cardiopulmonary disease. 2. Apical lungs collimated off view due to overlying soft tissues and osseous structures. Electronically Signed   By: Tish Frederickson M.D.   On: 11/10/2020 19:40   ECHOCARDIOGRAM COMPLETE  Result Date: 11/11/2020    ECHOCARDIOGRAM REPORT   Patient Name:   Christopher Cross Date of Exam: 11/11/2020 Medical Rec #:  [id number]     Height:       69.0 in Accession #:    [id number]    Weight:       200.0 lb Date of Birth:  [birth year]     BSA:          2.066 m Patient Age:    54 years      BP:           145/102 mmHg Patient Gender: M             HR:           84 bpm. [facility] Procedure: Intracardiac Opacification Agent Indications:    Elevated Troponin  History:        Patient has prior history of Echocardiogram examinations, most                 recent 07/16/2014. COPD; Risk Factors:Current Smoker, Diabetes                 and Hypertension. Elevated Troponin.  Sonographer:    Jeryl Columbia RDCS (AE) Referring Phys: [id number] DAVID MANUEL ORTIZ IMPRESSIONS  1. Left ventricular ejection fraction, by estimation, is 30 to 35%. The left ventricle has moderately decreased function. The left ventricle demonstrates regional wall motion abnormalities (see scoring diagram/findings for description). Suggestive of possible stress induced cardiomyopathy although ischemic cardiomyopathy is not excluded. There is mild left ventricular hypertrophy. Left ventricular diastolic parameters are indeterminate. Definity contrast utilzed with evidence of apical LV thrombus, some of which is loosely organized.  2. Right ventricular systolic function is normal. The right ventricular size is normal. Tricuspid regurgitation  signal is inadequate for assessing PA pressure.  3. The mitral valve is grossly normal. Trivial mitral valve regurgitation.  4. The aortic valve is tricuspid. Aortic valve regurgitation is not visualized.  5. The inferior vena cava is normal in size with greater than 50% respiratory variability, suggesting right atrial pressure of 3 mmHg. FINDINGS  Left Ventricle: Left ventricular ejection fraction, by estimation, is 30 to 35%. The left ventricle has moderately decreased function. The left ventricle demonstrates regional wall motion abnormalities. Definity contrast agent was given IV to delineate the left ventricular endocardial borders. The left ventricular internal cavity size was normal in size. There is mild left ventricular hypertrophy. Left ventricular diastolic parameters are indeterminate.  LV Wall Scoring: The entire apex, mid anterolateral segment, and mid inferoseptal segment are akinetic. The mid anterior segment and mid inferior segment are hypokinetic. The basal anterolateral segment, basal anterior segment, basal inferior segment, and basal inferoseptal segment are normal. Right Ventricle: The right ventricular size is normal. No increase in right ventricular wall thickness. Right ventricular systolic function is normal. Tricuspid regurgitation signal is inadequate for assessing PA pressure. Left Atrium: Left atrial size was normal in size. Right Atrium: Right atrial size was normal in size. Pericardium: There is no evidence of pericardial effusion. Presence of pericardial fat pad. Mitral Valve: The mitral valve is grossly normal. Trivial mitral valve regurgitation. Tricuspid Valve: The tricuspid valve is grossly normal. Tricuspid valve regurgitation is trivial. Aortic Valve: The aortic valve is tricuspid. There is mild aortic valve annular calcification. Aortic valve regurgitation is not visualized. Pulmonic Valve: The pulmonic valve was grossly normal. Pulmonic valve regurgitation is trivial. Aorta:  The aortic root is normal in size and  structure. Venous: The inferior vena cava is normal in size with greater than 50% respiratory variability, suggesting right atrial pressure of 3 mmHg. IAS/Shunts: No atrial level shunt detected by color flow Doppler.  LEFT VENTRICLE PLAX 2D LVIDd:         5.48 cm  Diastology LVIDs:         3.89 cm  LV e' medial:    6.09 cm/s LV PW:         1.13 cm  LV E/e' medial:  11.7 LV IVS:        0.86 cm  LV e' lateral:   5.87 cm/s LVOT diam:     2.00 cm  LV E/e' lateral: 12.1 LVOT Area:     3.14 cm  RIGHT VENTRICLE RV S prime:     15.20 cm/s TAPSE (M-mode): 1.8 cm LEFT ATRIUM             Index       RIGHT ATRIUM           Index LA diam:        3.60 cm 1.74 cm/m  RA Area:     11.20 cm LA Vol (A2C):   56.5 ml 27.35 ml/m RA Volume:   26.30 ml  12.73 ml/m LA Vol (A4C):   55.7 ml 26.96 ml/m LA Biplane Vol: 55.8 ml 27.01 ml/m   AORTA Ao Root diam: 2.70 cm MITRAL VALVE MV Area (PHT): 3.97 cm    SHUNTS MV Decel Time: 191 msec    Systemic Diam: 2.00 cm MV E velocity: 71.10 cm/s MV A velocity: 73.30 cm/s MV E/A ratio:  0.97 Nona Dell MD Electronically signed by Nona Dell MD Signature Date/Time: 11/11/2020/11:01:21 AM    Final    US Abdomen Limited RUQ (LIVER/GB)  Result Date: 11/11/2020 CLINICAL DATA:  Transaminasemia EXAM: ULTRASOUND ABDOMEN LIMITED RIGHT UPPER QUADRANT COMPARISON:  07/22/2020 CT renal stone. FINDINGS: Gallbladder: No gallstones or wall thickening visualized. No sonographic Murphy sign noted by sonographer. Common bile duct: Diameter: 5.0 mm Liver: No focal lesion identified. Increased parenchymal echogenicity. Portal vein is patent on color Doppler imaging with normal direction of blood flow towards the liver. Other: None. IMPRESSION: Hepatic steatosis.  No focal hepatic lesion. Electronically Signed   By: Stana Bunting M.D.   On: 11/11/2020 09:41    Cardiac Studies      Patient Profile     54 y.o. male with DM, HTN,  Heavy ETOH and smoking  history   Assessment & Plan    1.   Acute on chronic combined systolic and diastolic congestive heart failure: The patient presents with worsening heart failure symptoms.  He also has abnormal EKG and elevated troponin levels. He has significant alcohol abuse and drinks between 4 and 5 gallons of liquor per week.  He seems to be breathing a little bit better. He has diuresed 774 cc net so far during this admission.  He has apical akinesis and there appears to be a thrombus in his apical LV.  I agree with the plans for right and left heart catheterization tomorrow. We discussed risk, benefits, options concerning heart catheterization.  He understands and agrees to proceed.  2.  COPD: He is actively wheezing.  Continue nebulizers and steroids.  Further plans per internal medicine team.  3.  Left ventricular thrombus: Continue heparin.         For questions or updates, please contact [facility] Please consult [email or web address] for contact info under  Signed, Kristeen Miss, MD  11/12/2020, 9:05 AM

## 2020-11-12 NOTE — Progress Notes (Signed)
ANTICOAGULATION CONSULT NOTE  Pharmacy Consult for Heparin Indication: apical thrombus  No Known Allergies  Patient Measurements: Height: 5\' 9"  (175.3 cm) Weight: 91.6 kg (201 lb 15.1 oz) IBW/kg (Calculated) : 70.7 HEPARIN DW (KG): 89.6  Vital Signs: Temp: 97.9 F (36.6 C) (11/25 0520) Temp Source: Oral (11/25 0520) BP: 139/99 (11/25 0637) Pulse Rate: 87 (11/25 0637)  Labs: Recent Labs    11/10/20 2009 11/10/20 2009 11/10/20 2306 11/11/20 0428 11/11/20 0428 11/11/20 0935 11/11/20 1813 11/12/20 0000  HGB 14.3   < >  --  15.3   < > 15.2  --  13.6  HCT 41.1   < >  --  43.9  --  43.8  --  39.3  PLT 167   < >  --  187  --  199  --  180  HEPARINUNFRC  --   --   --   --   --   --  0.60 0.51  CREATININE 0.81   < >  --  0.74  --  0.71  --  0.67  TROPONINIHS 62*  --  43*  --   --   --   --   --    < > = values in this interval not displayed.    Estimated Creatinine Clearance: 120.8 mL/min (by C-G formula based on SCr of 0.67 mg/dL).   Medical History: Past Medical History:  Diagnosis Date  . Asthma   . Diabetes mellitus without complication (HCC)   . Other emphysema (HCC)   . Unspecified essential hypertension     Medications:  Medications Prior to Admission  Medication Sig Dispense Refill Last Dose  . acetaminophen (TYLENOL) 500 MG tablet Take 1,000 mg every 6 (six) hours as needed by mouth for headache.   Past Month at Unknown time  . amLODipine (NORVASC) 10 MG tablet Take 1 tablet (10 mg total) by mouth daily. (Patient taking differently: Take 10 mg by mouth every evening. ) 15 tablet 0 Past Week at Unknown time  . aspirin EC 81 MG tablet Take 1 tablet (81 mg total) by mouth daily. (Patient taking differently: Take 81 mg by mouth every evening. )   Past Week at Unknown time  . Aspirin-Salicylamide-Caffeine (BC HEADACHE POWDER PO) Take 1 packet daily as needed by mouth (headache).   Past Month at Unknown time  . atenolol (TENORMIN) 50 MG tablet TAKE 1 TABLET BY  MOUTH DAILY. (Patient taking differently: Take 50 mg by mouth every evening. ) 30 tablet 0 Past Week at Unknown time  . atorvastatin (LIPITOR) 40 MG tablet Take 40 mg by mouth every evening.    Past Week at Unknown time  . calcium carbonate (TUMS - DOSED IN MG ELEMENTAL CALCIUM) 500 MG chewable tablet Chew 2 tablets daily as needed by mouth for indigestion or heartburn.   Past Week at Unknown time  . ibuprofen (ADVIL,MOTRIN) 200 MG tablet Take 400 mg every 6 (six) hours as needed by mouth for headache.   Past Month at Unknown time  . Insulin Glargine (BASAGLAR KWIKPEN) 100 UNIT/ML Inject 30 Units into the skin at bedtime.   Past Week at Unknown time  . losartan (COZAAR) 25 MG tablet Take 25 mg by mouth every evening.    Past Week at Unknown time  . metFORMIN (GLUCOPHAGE) 1000 MG tablet Take 1 tablet (1,000 mg total) 2 (two) times daily by mouth. (Patient taking differently: Take 2,000 mg by mouth every evening. ) 30 tablet 0 Past Week at  Unknown time  . Multiple Vitamin (MULTIVITAMIN WITH MINERALS) TABS tablet Take 1 tablet by mouth every evening.    Past Week at Unknown time  . PROAIR HFA 108 (90 Base) MCG/ACT inhaler INHALE 1 TO 2 PUFFS EVERY SIX HOURS AS NEEDED FOR WHEEZING OR SHORTNESS OF BREATH (Patient taking differently: Inhale 1-2 puffs into the lungs every 6 (six) hours as needed for wheezing or shortness of breath. ) 8.5 g 2 Past Month at Unknown time  . sitaGLIPtin (JANUVIA) 100 MG tablet Take 100 mg by mouth every evening.    Past Month at Unknown time  . SPIRIVA HANDIHALER 18 MCG inhalation capsule Place 1 capsule into inhaler and inhale daily.   Past Week at Unknown time  . nitroGLYCERIN (NITROSTAT) 0.4 MG SL tablet Place 0.4 mg under the tongue every 5 (five) minutes as needed for chest pain. (Patient not taking: Reported on 11/11/2020)   Not Taking at Unknown time  . ondansetron (ZOFRAN ODT) 4 MG disintegrating tablet Take 1 tablet (4 mg total) by mouth every 8 (eight) hours as needed for  up to 10 doses for nausea or vomiting. (Patient not taking: Reported on 11/11/2020) 10 tablet 0 Not Taking at Unknown time  . oxyCODONE-acetaminophen (PERCOCET/ROXICET) 5-325 MG tablet Take 2 tablets by mouth every 6 (six) hours as needed for up to 12 doses for severe pain. (Patient not taking: Reported on 11/11/2020) 12 tablet 0 Not Taking at Unknown time  . sildenafil (VIAGRA) 100 MG tablet TAKE 1/2 TAB 30 MINS PRIOR (Patient not taking: Reported on 11/11/2020) 4 tablet 0 Not Taking at Unknown time    Assessment: 64 yom presented with shortness of breath and cough started on IV heparin for apical LV thrombus. No prior to admission anticoagulation. Anticipate cardiac cath on 11/26.  Confirmatory heparin level from this morning is therapeutic at 0.51. Hgb and platelets are WNL and stable. No bleeding reported.  Goal of Therapy:  Heparin level 0.3-0.7 units/ml Monitor platelets by anticoagulation protocol: Yes   Plan:  Continue IV heparin at 1400 units/hour Daily heparin level and CBC Monitor for s/sx bleeding  Laverna Peace, PharmD PGY-1 Pharmacy Resident 11/12/2020 7:31 AM Please see AMION for all pharmacy numbers

## 2020-11-12 NOTE — Progress Notes (Signed)
PROGRESS NOTE    Christopher Cross  WUJ:[id number] DOB: 1966-01-30 DOA: 11/10/2020 PCP: Avon Gully, MD   Brief Narrative: 54 year old with past medical history significant for COPD, tobacco abuse, diabetes type 2, hypertension who presents with 3 days history of cough, shortness of breath and posttussive emesis.  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 He is currently unemployed and has been trying to stretch out his medication including his insulin. Evaluation in the ED he was found to be hyperglycemic CBG 450, mild transaminases, CT angio was negative for PE.  Mild elevation of troponin.  Echocardiogram showed new reduced ejection fraction, wall motion abnormality and a left apical  ventricular thrombus.   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  Patient presented with chest discomfort for exertional, mild elevation of troponin, abnormal EKG.  Patient had T wave inversion diffusely. Plan for heart cath on 11/26. Continue with metoprolol.  Dyspnea this am. Chest x ray with mild interstitial edema. One time dose of IV lasix ordered,.  Cardiology following.    Left apical ventricular thrombus: Started on heparin drip. Continue with heparin gtt.  He will need assistance with medications.   COPD exacerbation: Complaining of dyspnea and Wheezing this am.  Continue with IV solumedrol.  Schedule nebulizer every 4 hours.  Chest x ray with edema.. IV lasix ordered.   Alcohol abuse: Drinks 3 to 4 gallons per week previously CIWA Per protocol   Uncontrolled diabetes type 2 with hyperglycemia Continue with 70/30.    SSI.   Tabacco abuse: Continue with nicotine patch  Hypertension: now on metoprolol./ might need entresto  Hyponatremia:  Resolved. NSL fluids.   Hypokalemia: Resolved.   Transaminases Right upper quadrant ultrasound hepatic steatosis otherwise negative. Follow trend.  Hepatitis C and b negative.   Leukocytosis; related to steroids.   Estimated body mass index is 29.82 kg/m as calculated from the following:   Height as of this encounter:  (1.753 m).   Weight as of this encounter: 91.6 kg.   DVT prophylaxis: heparin  Code Status: Full code Family Communication: care discussed with patient.  Disposition Plan:  Status is: Inpatient  Remains inpatient appropriate because:Hemodynamically unstable   Dispo: The patient is from: Home              Anticipated d/c is to: Home              Anticipated d/c date is: 3 days              Patient currently is not medically stable to d/c.        Consultants:   Cardiology   Procedures:   none  Antimicrobials:    Subjective: He is complaining of dyspnea. denies chest pain. Needs breathing treatment.,   Objective: Vitals:   11/11/20 2144 11/12/20 0020 11/12/20 0520 11/12/20 0637  BP: (!) 129/91 (!) 144/93 (!) 143/102 (!) 139/99  Pulse: 95 86 83 87  Resp: (!) 21  Temp: 97.7 F (36.5 C) 97.6 F (36.4 C) 97.9 F (36.6 C)   TempSrc: Oral Oral Oral   SpO2: 97% 96% 95% 96%  Weight:   91.6 kg   Height:        Intake/Output Summary (Last 24 hours) at 11/12/2020 0705 Last data filed at 11/11/2020 1500 Gross per 24 hour  Intake 426 ml  Output --  Net 426 ml   Filed Weights   11/10/20 1858 11/11/20 1546 11/12/20 0520  Weight: 90.7 kg 92.6 kg 91.6 kg    Examination:  General exam: Appears calm and comfortable  Respiratory system: tachypnea, bilateral wheezing.  Cardiovascular system: S1 & S2 heard, RRR. No JVD, murmurs, rubs, gallops or clicks. No pedal edema. Gastrointestinal system:  Abdomen is nondistended, soft and nontender. No organomegaly or masses felt. Normal bowel sounds heard. Central nervous system: Alert and oriented. No focal neurological deficits. Extremities: Symmetric 5 x 5 power.   Data Reviewed: I have personally reviewed following labs and imaging studies  CBC: Recent Labs  Lab 11/10/20 2009 11/11/20 0428 11/11/20 0935 11/12/20 0000  WBC 7.9 6.9 12.8* 13.0*  NEUTROABS 4.6  --   --   --   HGB 14.3 15.3 15.2 13.6  HCT 41.1 43.9 43.8 39.3  MCV 93.8 93.8 92.6 93.6  PLT 167 187 199 180   Basic Metabolic Panel: Recent Labs  Lab 11/10/20 2009 11/10/20 2306 11/11/20 0428 11/11/20 0935 11/11/20 1813 11/12/20 0000  NA 133*  --  132* 134*  --  137  K 3.4*  --  3.9 4.0  --  4.0  CL 98  --  100 103  --  104  CO2 24  --  23 23  --  23  GLUCOSE 450*  --  426* 322* 334* 362*  BUN 9  --  11 12  --  16  CREATININE 0.81  --  0.74 0.71  --  0.67  CALCIUM 8.6*  --  8.5* 8.5*  --  8.5*  MG  --  2.0  --  2.4  --   --   PHOS  --  3.0  --  2.7  --   --    GFR: Estimated Creatinine Clearance: 120.8 mL/min (by C-G formula based on SCr of 0.67 mg/dL). Liver Function Tests: Recent Labs  Lab 11/10/20 2009 11/11/20 0428 11/11/20 0935 11/12/20 0000  AST 170* 94* 74* 58*  ALT 156* 148* 139* 125*  ALKPHOS 87 93 88 68  BILITOT 0.4 0.7 0.4 0.5  PROT 6.6 6.9 6.9 5.7*  ALBUMIN 3.4* 3.6 3.6 3.0*   No results for input(s): LIPASE, AMYLASE in the last 168 hours. No results for input(s): AMMONIA in the last 168 hours. Coagulation Profile: No results for input(s): INR, PROTIME in the last 168 hours. Cardiac Enzymes: No results for input(s): CKTOTAL, CKMB, CKMBINDEX, TROPONINI in the last 168 hours. BNP (last 3 results) No results for input(s): PROBNP in the last 8760 hours. HbA1C: Recent Labs    11/10/20 2009  HGBA1C 10.8*   CBG: Recent Labs  Lab 11/11/20 0522 11/11/20 0849 11/11/20 1313 11/11/20 1613 11/11/20 2143  GLUCAP 399* 307* 468* 412*  365*   Lipid Profile: Recent Labs    11/12/20 0000  CHOL 152  HDL 55  LDLCALC 85  TRIG 62  CHOLHDL 2.8   Thyroid Function Tests: No results for input(s): TSH, T4TOTAL, FREET4, T3FREE, THYROIDAB in the last 72 hours. Anemia Panel: No results for input(s): VITAMINB12, FOLATE, FERRITIN, TIBC, IRON, RETICCTPCT in the last 72 hours. Sepsis Labs: No results for input(s): PROCALCITON, LATICACIDVEN in the last 168 hours.  Recent Results (from the past 240  hour(s))  Resp Panel by RT-PCR (Flu A&B, Covid) Nasopharyngeal Swab     Status: None   Collection Time: 11/10/20  8:20 PM   Specimen: Nasopharyngeal Swab; Nasopharyngeal(NP) swabs in vial transport medium  Result Value Ref Range Status   SARS Coronavirus 2 by RT PCR NEGATIVE NEGATIVE Final    Comment: (NOTE) SARS-CoV-2 target nucleic acids are NOT DETECTED.  The SARS-CoV-2 RNA is generally detectable in upper respiratory specimens during the acute phase of infection. The lowest concentration of SARS-CoV-2 viral copies this assay can detect is 138 copies/mL. A negative result does not preclude SARS-Cov-2 infection and should not be used as the sole basis for treatment or other patient management decisions. A negative result may occur with  improper specimen collection/handling, submission of specimen other than nasopharyngeal swab, presence of viral mutation(s) within the areas targeted by this assay, and inadequate number of viral copies(<138 copies/mL). A negative result must be combined with clinical observations, patient history, and epidemiological information. The expected result is Negative.  Fact Sheet for Patients:  BloggerCourse.com  Fact Sheet for Healthcare Providers:  SeriousBroker.it  This test is no t yet approved or cleared by the [place] FDA and  has been authorized for detection and/or diagnosis of SARS-CoV-2 by FDA under an Emergency Use Authorization  (EUA). This EUA will remain  in effect (meaning this test can be used) for the duration of the COVID-19 declaration under Section 564(b)(1) of the Act, 21 U.S.C.section 360bbb-3(b)(1), unless the authorization is terminated  or revoked sooner.       Influenza A by PCR NEGATIVE NEGATIVE Final   Influenza B by PCR NEGATIVE NEGATIVE Final    Comment: (NOTE) The Xpert Xpress SARS-CoV-2/FLU/RSV plus assay is intended as an aid in the diagnosis of influenza from Nasopharyngeal swab specimens and should not be used as a sole basis for treatment. Nasal washings and aspirates are unacceptable for Xpert Xpress SARS-CoV-2/FLU/RSV testing.  Fact Sheet for Patients: BloggerCourse.com  Fact Sheet for Healthcare Providers: SeriousBroker.it  This test is not yet approved or cleared by the [place] FDA and has been authorized for detection and/or diagnosis of SARS-CoV-2 by FDA under an Emergency Use Authorization (EUA). This EUA will remain in effect (meaning this test can be used) for the duration of the COVID-19 declaration under Section 564(b)(1) of the Act, 21 U.S.C. section 360bbb-3(b)(1), unless the authorization is terminated or revoked.  Performed at [facility], [address], [place], [place] [address]          Radiology Studies: CT Angio Chest PE W and/or Wo Contrast  Result Date: 11/10/2020 CLINICAL DATA:  Cough and fever. EXAM: CT ANGIOGRAPHY CHEST WITH CONTRAST TECHNIQUE: Multidetector CT imaging of the chest was performed using the standard protocol during bolus administration of intravenous contrast. Multiplanar CT image reconstructions and MIPs were obtained to evaluate the vascular anatomy. CONTRAST:  OMNIPAQUE IOHEXOL 350 MG/ML SOLN COMPARISON:  None. FINDINGS: Cardiovascular: Satisfactory opacification of the pulmonary arteries to the segmental level. No evidence of pulmonary embolism. Normal heart size. No  pericardial effusion. Mediastinum/Nodes: No enlarged mediastinal, hilar, or axillary lymph nodes. Thyroid gland, trachea, and esophagus demonstrate no significant findings. Lungs/Pleura: Lungs are clear. No pleural effusion or pneumothorax. Upper Abdomen: No acute abnormality. Musculoskeletal: No chest wall abnormality. No acute or significant osseous findings. Review of the MIP images confirms the above findings. IMPRESSION: Negative examination for pulmonary embolism or active cardiopulmonary disease. Electronically Signed   By: Aram Candela M.D.   On:  11/10/2020 22:46   DG Chest Portable 1 View  Result Date: 11/10/2020 CLINICAL DATA:  Cough, fever.  Asthma. EXAM: PORTABLE CHEST 1 VIEW COMPARISON:  Chest x-ray 02/21/2014 FINDINGS: The heart size and mediastinal contours are within normal limits. No focal consolidation. Increased interstitial markings with no overt pulmonary edema. No pleural effusion. No pneumothorax. Apical lungs collimated off view due to overlying soft tissues and osseous structures. No acute osseous abnormality. IMPRESSION: 1. No active cardiopulmonary disease. 2. Apical lungs collimated off view due to overlying soft tissues and osseous structures. Electronically Signed   By: Tish Frederickson M.D.   On: 11/10/2020 19:40   ECHOCARDIOGRAM COMPLETE  Result Date: 11/11/2020    ECHOCARDIOGRAM REPORT   Patient Name:   SYDNEY USSELMAN Wagler Date of Exam: 11/11/2020 Medical Rec #:  [id number]     Height:       69.0 in Accession #:    [id number]    Weight:       200.0 lb Date of Birth:  Apr 21, 1966     BSA:          2.066 m Patient Age:    54 years      BP:           145/102 mmHg Patient Gender: M             HR:           84 bpm. [facility] Procedure: Intracardiac Opacification Agent Indications:    Elevated Troponin  History:        Patient has prior history of Echocardiogram examinations, most                 recent 07/16/2014. COPD; Risk Factors:Current Smoker, Diabetes                  and Hypertension. Elevated Troponin.  Sonographer:    Jeryl Columbia RDCS (AE) Referring Phys: [id number] DAVID MANUEL ORTIZ IMPRESSIONS  1. Left ventricular ejection fraction, by estimation, is 30 to 35%. The left ventricle has moderately decreased function. The left ventricle demonstrates regional wall motion abnormalities (see scoring diagram/findings for description). Suggestive of possible stress induced cardiomyopathy although ischemic cardiomyopathy is not excluded. There is mild left ventricular hypertrophy. Left ventricular diastolic parameters are indeterminate. Definity contrast utilzed with evidence of apical LV thrombus, some of which is loosely organized.  2. Right ventricular systolic function is normal. The right ventricular size is normal. Tricuspid regurgitation signal is inadequate for assessing PA pressure.  3. The mitral valve is grossly normal. Trivial mitral valve regurgitation.  4. The aortic valve is tricuspid. Aortic valve regurgitation is not visualized.  5. The inferior vena cava is normal in size with greater than 50% respiratory variability, suggesting right atrial pressure of 3 mmHg. FINDINGS  Left Ventricle: Left ventricular ejection fraction, by estimation, is 30 to 35%. The left ventricle has moderately decreased function. The left ventricle demonstrates regional wall motion abnormalities. Definity contrast agent was given IV to delineate the left ventricular endocardial borders. The left ventricular internal cavity size was normal in size. There is mild left ventricular hypertrophy. Left ventricular diastolic parameters are indeterminate.  LV Wall Scoring: The entire apex, mid anterolateral segment, and mid inferoseptal segment are akinetic. The mid anterior segment and mid inferior segment are hypokinetic. The basal anterolateral segment, basal anterior segment, basal inferior segment, and basal inferoseptal segment are normal. Right Ventricle: The right ventricular size is  normal. No increase in right ventricular wall thickness.  Right ventricular systolic function is normal. Tricuspid regurgitation signal is inadequate for assessing PA pressure. Left Atrium: Left atrial size was normal in size. Right Atrium: Right atrial size was normal in size. Pericardium: There is no evidence of pericardial effusion. Presence of pericardial fat pad. Mitral Valve: The mitral valve is grossly normal. Trivial mitral valve regurgitation. Tricuspid Valve: The tricuspid valve is grossly normal. Tricuspid valve regurgitation is trivial. Aortic Valve: The aortic valve is tricuspid. There is mild aortic valve annular calcification. Aortic valve regurgitation is not visualized. Pulmonic Valve: The pulmonic valve was grossly normal. Pulmonic valve regurgitation is trivial. Aorta: The aortic root is normal in size and structure. Venous: The inferior vena cava is normal in size with greater than 50% respiratory variability, suggesting right atrial pressure of 3 mmHg. IAS/Shunts: No atrial level shunt detected by color flow Doppler.  LEFT VENTRICLE PLAX 2D LVIDd:         5.48 cm  Diastology LVIDs:         3.89 cm  LV e' medial:    6.09 cm/s LV PW:         1.13 cm  LV E/e' medial:  11.7 LV IVS:        0.86 cm  LV e' lateral:   5.87 cm/s LVOT diam:     2.00 cm  LV E/e' lateral: 12.1 LVOT Area:     3.14 cm  RIGHT VENTRICLE RV S prime:     15.20 cm/s TAPSE (M-mode): 1.8 cm LEFT ATRIUM             Index       RIGHT ATRIUM           Index LA diam:        3.60 cm 1.74 cm/m  RA Area:     11.20 cm LA Vol (A2C):   56.5 ml 27.35 ml/m RA Volume:   26.30 ml  12.73 ml/m LA Vol (A4C):   55.7 ml 26.96 ml/m LA Biplane Vol: 55.8 ml 27.01 ml/m   AORTA Ao Root diam: 2.70 cm MITRAL VALVE MV Area (PHT): 3.97 cm    SHUNTS MV Decel Time: 191 msec    Systemic Diam: 2.00 cm MV E velocity: 71.10 cm/s MV A velocity: 73.30 cm/s MV E/A ratio:  0.97 Nona Dell MD Electronically signed by Nona Dell MD Signature Date/Time:  11/11/2020/11:01:21 AM    Final    US Abdomen Limited RUQ (LIVER/GB)  Result Date: 11/11/2020 CLINICAL DATA:  Transaminasemia EXAM: ULTRASOUND ABDOMEN LIMITED RIGHT UPPER QUADRANT COMPARISON:  07/22/2020 CT renal stone. FINDINGS: Gallbladder: No gallstones or wall thickening visualized. No sonographic Murphy sign noted by sonographer. Common bile duct: Diameter: 5.0 mm Liver: No focal lesion identified. Increased parenchymal echogenicity. Portal vein is patent on color Doppler imaging with normal direction of blood flow towards the liver. Other: None. IMPRESSION: Hepatic steatosis.  No focal hepatic lesion. Electronically Signed   By: Stana Bunting M.D.   On: 11/11/2020 09:41        Scheduled Meds: . [START ON 11/13/2020] aspirin  81 mg Oral Pre-Cath  . aspirin EC  81 mg Oral QPM  . budesonide (PULMICORT) nebulizer solution  0.5 mg Nebulization BID  . folic acid  1 mg Oral Daily  . influenza vac split quadrivalent PF  0.5 mL Intramuscular Tomorrow-1000  . insulin aspart  0-20 Units Subcutaneous TID WC  . insulin aspart protamine- aspart  30 Units Subcutaneous BID WC  . ipratropium-albuterol  3 mL Nebulization  TID  . losartan  25 mg Oral QPM  . methylPREDNISolone (SOLU-MEDROL) injection  60 mg Intravenous Q12H  . metoprolol succinate  25 mg Oral Daily  . multivitamin with minerals  1 tablet Oral Daily  . pneumococcal 23 valent vaccine  0.5 mL Intramuscular Tomorrow-1000  . sodium chloride flush  3 mL Intravenous Q12H  . thiamine  100 mg Oral Daily   Or  . thiamine  100 mg Intravenous Daily   Continuous Infusions: . sodium chloride 125 mL/hr at 11/12/20 0449  . sodium chloride    . [START ON 11/13/2020] sodium chloride    . heparin 1,400 Units/hr (11/12/20 0356)     LOS: 1 day    Time spent: 35 minutes.     Alba Cory, MD [facility]   If 7PM-7AM, please contact night-coverage [email or web address]  11/12/2020, 7:05 AM

## 2020-11-13 ENCOUNTER — Encounter (HOSPITAL_COMMUNITY)
Admission: EM | Disposition: A | Discharge status: Home / Self Care | Payer: Self-pay | Source: Home / Self Care | Attending: Internal Medicine

## 2020-11-13 DIAGNOSIS — I5021 Acute systolic (congestive) heart failure: Secondary | ICD-10-CM

## 2020-11-13 HISTORY — PX: RIGHT/LEFT HEART CATH AND CORONARY ANGIOGRAPHY: CATH118266

## 2020-11-13 LAB — POCT I-STAT EG7
Acid-Base Excess: 0 mmol/L (ref 0.0–2.0)
Acid-Base Excess: 1 mmol/L (ref 0.0–2.0)
Acid-Base Excess: 1 mmol/L (ref 0.0–2.0)
Acid-Base Excess: 2 mmol/L (ref 0.0–2.0)
Bicarbonate: 26.1 mmol/L (ref 20.0–28.0)
Bicarbonate: 26.4 mmol/L (ref 20.0–28.0)
Bicarbonate: 27.6 mmol/L (ref 20.0–28.0)
Bicarbonate: 28.1 mmol/L — ABNORMAL HIGH (ref 20.0–28.0)
Calcium, Ion: 1.22 mmol/L (ref 1.15–1.40)
Calcium, Ion: 1.22 mmol/L (ref 1.15–1.40)
Calcium, Ion: 1.23 mmol/L (ref 1.15–1.40)
Calcium, Ion: 1.23 mmol/L (ref 1.15–1.40)
HCT: 40 % (ref 39.0–52.0)
HCT: 41 % (ref 39.0–52.0)
HCT: 42 % (ref 39.0–52.0)
HCT: 42 % (ref 39.0–52.0)
Hemoglobin: 13.6 g/dL (ref 13.0–17.0)
Hemoglobin: 13.9 g/dL (ref 13.0–17.0)
Hemoglobin: 14.3 g/dL (ref 13.0–17.0)
Hemoglobin: 14.3 g/dL (ref 13.0–17.0)
O2 Saturation: 77 %
O2 Saturation: 77 %
O2 Saturation: 93 %
O2 Saturation: 96 %
Potassium: 3.7 mmol/L (ref 3.5–5.1)
Potassium: 3.8 mmol/L (ref 3.5–5.1)
Potassium: 3.9 mmol/L (ref 3.5–5.1)
Potassium: 3.9 mmol/L (ref 3.5–5.1)
Sodium: 137 mmol/L (ref 135–145)
Sodium: 138 mmol/L (ref 135–145)
Sodium: 138 mmol/L (ref 135–145)
Sodium: 138 mmol/L (ref 135–145)
TCO2: 27 mmol/L (ref 22–32)
TCO2: 28 mmol/L (ref 22–32)
TCO2: 29 mmol/L (ref 22–32)
TCO2: 30 mmol/L (ref 22–32)
pCO2, Ven: 45.1 mmHg (ref 44.0–60.0)
pCO2, Ven: 45.9 mmHg (ref 44.0–60.0)
pCO2, Ven: 48.8 mmHg (ref 44.0–60.0)
pCO2, Ven: 49.1 mmHg (ref 44.0–60.0)
pH, Ven: 7.358 (ref 7.250–7.430)
pH, Ven: 7.362 (ref 7.250–7.430)
pH, Ven: 7.368 (ref 7.250–7.430)
pH, Ven: 7.375 (ref 7.250–7.430)
pO2, Ven: 43 mmHg (ref 32.0–45.0)
pO2, Ven: 44 mmHg (ref 32.0–45.0)
pO2, Ven: 71 mmHg — ABNORMAL HIGH (ref 32.0–45.0)
pO2, Ven: 88 mmHg — ABNORMAL HIGH (ref 32.0–45.0)

## 2020-11-13 LAB — CBC
HCT: 41.4 % (ref 39.0–52.0)
Hemoglobin: 14.3 g/dL (ref 13.0–17.0)
MCH: 32.7 pg (ref 26.0–34.0)
MCHC: 34.5 g/dL (ref 30.0–36.0)
MCV: 94.7 fL (ref 80.0–100.0)
Platelets: 172 10*3/uL (ref 150–400)
RBC: 4.37 MIL/uL (ref 4.22–5.81)
RDW: 11.9 % (ref 11.5–15.5)
WBC: 12.5 10*3/uL — ABNORMAL HIGH (ref 4.0–10.5)
nRBC: 0.2 % (ref 0.0–0.2)

## 2020-11-13 LAB — POCT I-STAT 7, (LYTES, BLD GAS, ICA,H+H)
Acid-Base Excess: 1 mmol/L (ref 0.0–2.0)
Bicarbonate: 26.4 mmol/L (ref 20.0–28.0)
Calcium, Ion: 1.22 mmol/L (ref 1.15–1.40)
HCT: 42 % (ref 39.0–52.0)
Hemoglobin: 14.3 g/dL (ref 13.0–17.0)
O2 Saturation: 98 %
Potassium: 3.9 mmol/L (ref 3.5–5.1)
Sodium: 137 mmol/L (ref 135–145)
TCO2: 28 mmol/L (ref 22–32)
pCO2 arterial: 44.2 mmHg (ref 32.0–48.0)
pH, Arterial: 7.384 (ref 7.350–7.450)
pO2, Arterial: 115 mmHg — ABNORMAL HIGH (ref 83.0–108.0)

## 2020-11-13 LAB — BASIC METABOLIC PANEL
Anion gap: 11 (ref 5–15)
BUN: 18 mg/dL (ref 6–20)
CO2: 22 mmol/L (ref 22–32)
Calcium: 8.9 mg/dL (ref 8.9–10.3)
Chloride: 102 mmol/L (ref 98–111)
Creatinine, Ser: 0.92 mg/dL (ref 0.61–1.24)
GFR, Estimated: >60 mL/min (ref >60)
Glucose, Bld: 365 mg/dL — ABNORMAL HIGH (ref 70–99)
Potassium: 4.2 mmol/L (ref 3.5–5.1)
Sodium: 135 mmol/L (ref 135–145)

## 2020-11-13 LAB — GLUCOSE, CAPILLARY
Glucose-Capillary: 338 mg/dL — ABNORMAL HIGH (ref 70–99)
Glucose-Capillary: 283 mg/dL — ABNORMAL HIGH (ref 70–99)
Glucose-Capillary: 218 mg/dL — ABNORMAL HIGH (ref 70–99)
Glucose-Capillary: 419 mg/dL — ABNORMAL HIGH (ref 70–99)
Glucose-Capillary: 377 mg/dL — ABNORMAL HIGH (ref 70–99)
Glucose-Capillary: 286 mg/dL — ABNORMAL HIGH (ref 70–99)

## 2020-11-13 LAB — HEPARIN LEVEL (UNFRACTIONATED): Heparin Unfractionated: 0.27 IU/mL — ABNORMAL LOW (ref 0.30–0.70)

## 2020-11-13 SURGERY — RIGHT/LEFT HEART CATH AND CORONARY ANGIOGRAPHY
Anesthesia: LOCAL

## 2020-11-13 MED ORDER — FENTANYL CITRATE (PF) 100 MCG/2ML IJ SOLN
INTRAMUSCULAR | Status: AC
  Filled 2020-11-13: qty 2

## 2020-11-13 MED ORDER — IOHEXOL 350 MG/ML SOLN
INTRAVENOUS | Status: DC | PRN
  Administered 2020-11-13: 35 mL

## 2020-11-13 MED ORDER — VERAPAMIL HCL 2.5 MG/ML IV SOLN
INTRAVENOUS | Status: DC | PRN
  Administered 2020-11-13: 10 mL via INTRA_ARTERIAL

## 2020-11-13 MED ORDER — HEPARIN SODIUM (PORCINE) 1000 UNIT/ML IJ SOLN
INTRAMUSCULAR | Status: AC
  Filled 2020-11-13: qty 1

## 2020-11-13 MED ORDER — HEPARIN SODIUM (PORCINE) 1000 UNIT/ML IJ SOLN
INTRAMUSCULAR | Status: DC | PRN
  Administered 2020-11-13: 5000 [IU] via INTRAVENOUS

## 2020-11-13 MED ORDER — LIDOCAINE HCL (PF) 1 % IJ SOLN
INTRAMUSCULAR | Status: DC | PRN
  Administered 2020-11-13 (×2): 2 mL

## 2020-11-13 MED ORDER — HEPARIN (PORCINE) IN NACL 1000-0.9 UT/500ML-% IV SOLN
INTRAVENOUS | Status: AC
  Filled 2020-11-13: qty 1000

## 2020-11-13 MED ORDER — SODIUM CHLORIDE 0.9 % IV SOLN: 250.0000 mL | INTRAVENOUS | Status: DC | PRN

## 2020-11-13 MED ORDER — SODIUM CHLORIDE 0.9% FLUSH: 3.0000 mL | INTRAVENOUS | Status: DC | PRN

## 2020-11-13 MED ORDER — INSULIN ASPART 100 UNIT/ML ~~LOC~~ SOLN
15.0000 [IU] | Freq: Once | SUBCUTANEOUS | Status: AC
  Administered 2020-11-13: 15 [IU] via SUBCUTANEOUS

## 2020-11-13 MED ORDER — UMECLIDINIUM BROMIDE 62.5 MCG/INH IN AEPB
1.0000 | INHALATION_SPRAY | Freq: Every day | RESPIRATORY_TRACT | Status: DC
  Administered 2020-11-14 – 2020-11-15 (×2): 1 via RESPIRATORY_TRACT
  Filled 2020-11-13: qty 7

## 2020-11-13 MED ORDER — TIOTROPIUM BROMIDE MONOHYDRATE 18 MCG IN CAPS: 1.0000 | ORAL_CAPSULE | Freq: Every day | RESPIRATORY_TRACT | Status: DC

## 2020-11-13 MED ORDER — LABETALOL HCL 5 MG/ML IV SOLN: 10.0000 mg | INTRAVENOUS | Status: AC | PRN

## 2020-11-13 MED ORDER — INSULIN ASPART PROT & ASPART (70-30 MIX) 100 UNIT/ML ~~LOC~~ SUSP
35.0000 [IU] | Freq: Two times a day (BID) | SUBCUTANEOUS | Status: DC
  Administered 2020-11-14 (×2): 35 [IU] via SUBCUTANEOUS

## 2020-11-13 MED ORDER — SODIUM CHLORIDE 0.9% FLUSH
3.0000 mL | Freq: Two times a day (BID) | INTRAVENOUS | Status: DC
  Administered 2020-11-13 – 2020-11-15 (×4): 3 mL via INTRAVENOUS

## 2020-11-13 MED ORDER — LIDOCAINE HCL (PF) 1 % IJ SOLN
INTRAMUSCULAR | Status: AC
  Filled 2020-11-13: qty 30

## 2020-11-13 MED ORDER — HEPARIN (PORCINE) IN NACL 1000-0.9 UT/500ML-% IV SOLN
INTRAVENOUS | Status: DC | PRN
  Administered 2020-11-13 (×2): 500 mL

## 2020-11-13 MED ORDER — ACETAMINOPHEN 325 MG PO TABS: 650.0000 mg | ORAL_TABLET | ORAL | Status: DC | PRN

## 2020-11-13 MED ORDER — HYDRALAZINE HCL 20 MG/ML IJ SOLN: 10.0000 mg | INTRAMUSCULAR | Status: AC | PRN

## 2020-11-13 MED ORDER — MIDAZOLAM HCL 2 MG/2ML IJ SOLN
INTRAMUSCULAR | Status: AC
  Filled 2020-11-13: qty 2

## 2020-11-13 MED ORDER — PREDNISONE 20 MG PO TABS
40.0000 mg | ORAL_TABLET | Freq: Every day | ORAL | Status: DC
  Administered 2020-11-14 – 2020-11-15 (×2): 40 mg via ORAL
  Filled 2020-11-13 (×2): qty 2

## 2020-11-13 MED ORDER — MIDAZOLAM HCL 2 MG/2ML IJ SOLN
INTRAMUSCULAR | Status: DC | PRN
  Administered 2020-11-13: 2 mg via INTRAVENOUS

## 2020-11-13 MED ORDER — HEPARIN (PORCINE) 25000 UT/250ML-% IV SOLN
1500.0000 [IU]/h | INTRAVENOUS | Status: DC
  Administered 2020-11-13: 1500 [IU]/h via INTRAVENOUS
  Filled 2020-11-13: qty 250

## 2020-11-13 MED ORDER — FENTANYL CITRATE (PF) 100 MCG/2ML IJ SOLN
INTRAMUSCULAR | Status: DC | PRN
  Administered 2020-11-13: 25 ug via INTRAVENOUS

## 2020-11-13 MED ORDER — VERAPAMIL HCL 2.5 MG/ML IV SOLN
INTRAVENOUS | Status: AC
  Filled 2020-11-13: qty 2

## 2020-11-13 SURGICAL SUPPLY — 14 items
CATH 5FR JL3.5 JR4 ANG PIG MP (CATHETERS) ×1 IMPLANT
CATH SWAN GANZ 7F STRAIGHT (CATHETERS) ×1 IMPLANT
DEVICE RAD COMP TR BAND LRG (VASCULAR PRODUCTS) ×1 IMPLANT
GLIDESHEATH SLEND SS 6F .021 (SHEATH) ×1 IMPLANT
GLIDESHEATH SLENDER 7FR .021G (SHEATH) ×1 IMPLANT
GUIDEWIRE .025 260CM (WIRE) ×1 IMPLANT
GUIDEWIRE INQWIRE 1.5J.035X260 (WIRE) IMPLANT
INQWIRE 1.5J .035X260CM (WIRE) ×2
KIT HEART LEFT (KITS) ×2 IMPLANT
PACK CARDIAC CATHETERIZATION (CUSTOM PROCEDURE TRAY) ×2 IMPLANT
TRANSDUCER W/STOPCOCK (MISCELLANEOUS) ×2 IMPLANT
TUBING ART PRESS 72  MALE/FEM (TUBING) ×2
TUBING ART PRESS 72 MALE/FEM (TUBING) IMPLANT
TUBING CIL FLEX 10 FLL-RA (TUBING) ×2 IMPLANT

## 2020-11-13 NOTE — Progress Notes (Signed)
PROGRESS NOTE    Christopher Cross  KVQ:[id number] DOB: [birth year] DOA: 11/10/2020 PCP: Avon Gully, MD   Brief Narrative: 54 year old with past medical history significant for COPD, tobacco abuse, diabetes type 2, hypertension who presents with 3 days history of cough, shortness of breath and posttussive emesis.  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 He is currently unemployed and has been trying to stretch out his medication including his insulin. Evaluation in the ED he was found to be hyperglycemic CBG 450, mild transaminases, CT angio was negative for PE.  Mild elevation of troponin.  Echocardiogram showed new reduced ejection fraction, wall motion abnormality and a left apical  ventricular thrombus.   Assessment & Plan:   Principal Problem:   COPD exacerbation (HCC) Active Problems:   HTN (hypertension)   Uncontrolled type 2 diabetes mellitus with hypoglycemia (HCC)   Hypokalemia   Elevated troponin   Prolonged QT interval   Tobacco use   Transaminitis   Acute systolic heart failure (HCC)   1-New cardiomyopathy, ejection fraction 35%:Acute systolic Heart failure exacerbation.  Patient presented with chest discomfort for exertional, mild elevation of troponin, abnormal EKG.  Patient had T wave inversion diffusely. Plan for heart cath on 11/26. No apparent CAD.  Continue with metoprolol.  Received lasix 11/25 Cardiology following.  Dyspnea improved.   Left apical ventricular thrombus: Started on heparin drip. Continue with heparin gtt.  He will need assistance with medications.   COPD exacerbation: Complaining of dyspnea and Wheezing this am.  Continue with IV solumedrol.  Schedule nebulizer every 4 hours.  Chest x ray with edema.. received one dose of lasix 11/05. Dyspnea improved./  Counseling about smoking,   Alcohol abuse: Drinks 3 to 4 gallons per week previously CIWA Per  protocol   Uncontrolled diabetes type 2 with hyperglycemia Continue with 70/30.  SSI.   Tabacco abuse: Continue with nicotine patch  Hypertension: now on metoprolol./ might need entresto  Hyponatremia:  Resolved. NSL fluids.   Hypokalemia: Resolved.   Transaminases Right upper quadrant ultrasound hepatic steatosis otherwise negative. Follow trend.  Hepatitis C and b negative.   Leukocytosis; related to steroids.   Estimated body mass index is 29.76 kg/m as calculated from the following:   Height as of this encounter: 5\' 9"  (1.753 m).   Weight as of this encounter: 91.4 kg.   DVT prophylaxis: heparin  Code Status: Full code Family Communication: care discussed with patient.  Disposition Plan:  Status is: Inpatient  Remains inpatient appropriate because:Hemodynamically unstable   Dispo: The patient is from: Home              Anticipated d/c is to: Home              Anticipated d/c date is: 3 days              Patient currently is not medically stable to d/c.        Consultants:   Cardiology   Procedures:   Cath: LV end diastolic pressure is mildly elevated. LVEDP 22 mm Hg.  There is no aortic valve stenosis.  No angiographically apparent CAD.  Ao 98%, PA sat 77%, PA pressure 33/19, mean PA 26 mm Hg; PCWP 19 mm Hg; CO 6.7 L/min; CI 3.2  Hemodynamic findings consistent with mild pulmonary hypertension.      Antimicrobials:    Subjective: He is breathing better today.   Objective: Vitals:   11/13/20  1100 11/13/20 1143 11/13/20 1213 11/13/20 1242  BP: (!) 133/119 (!) 145/100 (!) 144/103 (!) 155/108  Pulse: 85 75 76 81  Resp: (!) 23 12 14 19   Temp:      TempSrc:      SpO2: 97% 96% 99% 99%  Weight:      Height:        Intake/Output Summary (Last 24 hours) at 11/13/2020 1422 Last data filed at 11/13/2020 11/15/2020 Gross per 24 hour  Intake 1000 ml  Output 3000 ml  Net -2000 ml   Filed Weights   11/11/20 1546 11/12/20 0520 11/13/20  0609  Weight: 92.6 kg 91.6 kg 91.4 kg    Examination:  General exam: NAD Respiratory system: B/L wheezing Cardiovascular system: S 1, S 2 RRR Gastrointestinal system: BS present, soft, nt Central nervous system: Aalert Extremities: Symmetric power   Data Reviewed: I have personally reviewed following labs and imaging studies  CBC: Recent Labs  Lab 11/10/20 2009 11/11/20 0428 11/11/20 0935 11/12/20 0000 11/13/20 0212  WBC 7.9 6.9 12.8* 13.0* 12.5*  NEUTROABS 4.6  --   --   --   --   HGB 14.3 15.3 15.2 13.6 14.3  HCT 41.1 43.9 43.8 39.3 41.4  MCV 93.8 93.8 92.6 93.6 94.7  PLT 167 187 199 180 172   Basic Metabolic Panel: Recent Labs  Lab 11/10/20 2009 11/10/20 2009 11/10/20 2306 11/11/20 0428 11/11/20 0935 11/11/20 1813 11/12/20 0000 11/13/20 0212  NA 133*  --   --  132* 134*  --  137 135  K 3.4*  --   --  3.9 4.0  --  4.0 4.2  CL 98  --   --  100 103  --  104 102  CO2 24  --   --  23 23  --  23 22  GLUCOSE 450*   < >  --  426* 322* 334* 362* 365*  BUN 9  --   --  11 12  --  16 18  CREATININE 0.81  --   --  0.74 0.71  --  0.67 0.92  CALCIUM 8.6*  --   --  8.5* 8.5*  --  8.5* 8.9  MG  --   --  2.0  --  2.4  --   --   --   PHOS  --   --  3.0  --  2.7  --   --   --    < > = values in this interval not displayed.   GFR: Estimated Creatinine Clearance: 105 mL/min (by C-G formula based on SCr of 0.92 mg/dL). Liver Function Tests: Recent Labs  Lab 11/10/20 2009 11/11/20 0428 11/11/20 0935 11/12/20 0000  AST 170* 94* 74* 58*  ALT 156* 148* 139* 125*  ALKPHOS 87 93 88 68  BILITOT 0.4 0.7 0.4 0.5  PROT 6.6 6.9 6.9 5.7*  ALBUMIN 3.4* 3.6 3.6 3.0*   No results for input(s): LIPASE, AMYLASE in the last 168 hours. No results for input(s): AMMONIA in the last 168 hours. Coagulation Profile: No results for input(s): INR, PROTIME in the last 168 hours. Cardiac Enzymes: No results for input(s): CKTOTAL, CKMB, CKMBINDEX, TROPONINI in the last 168 hours. BNP (last  3 results) No results for input(s): PROBNP in the last 8760 hours. HbA1C: Recent Labs    11/10/20 2009  HGBA1C 10.8*   CBG: Recent Labs  Lab 11/12/20 1204 11/12/20 1730 11/13/20 0840 11/13/20 1000 11/13/20 1128  GLUCAP 397* 495* 338* 283*  218*   Lipid Profile: Recent Labs    11/12/20 0000  CHOL 152  HDL 55  LDLCALC 85  TRIG 62  CHOLHDL 2.8   Thyroid Function Tests: No results for input(s): TSH, T4TOTAL, FREET4, T3FREE, THYROIDAB in the last 72 hours. Anemia Panel: No results for input(s): VITAMINB12, FOLATE, FERRITIN, TIBC, IRON, RETICCTPCT in the last 72 hours. Sepsis Labs: No results for input(s): PROCALCITON, LATICACIDVEN in the last 168 hours.  Recent Results (from the past 240 hour(s))  Resp Panel by RT-PCR (Flu A&B, Covid) Nasopharyngeal Swab     Status: None   Collection Time: 11/10/20  8:20 PM   Specimen: Nasopharyngeal Swab; Nasopharyngeal(NP) swabs in vial transport medium  Result Value Ref Range Status   SARS Coronavirus 2 by RT PCR NEGATIVE NEGATIVE Final    Comment: (NOTE) SARS-CoV-2 target nucleic acids are NOT DETECTED.  The SARS-CoV-2 RNA is generally detectable in upper respiratory specimens during the acute phase of infection. The lowest concentration of SARS-CoV-2 viral copies this assay can detect is 138 copies/mL. A negative result does not preclude SARS-Cov-2 infection and should not be used as the sole basis for treatment or other patient management decisions. A negative result may occur with  improper specimen collection/handling, submission of specimen other than nasopharyngeal swab, presence of viral mutation(s) within the areas targeted by this assay, and inadequate number of viral copies(<138 copies/mL). A negative result must be combined with clinical observations, patient history, and epidemiological information. The expected result is Negative.  Fact Sheet for Patients:  BloggerCourse.com  Fact Sheet  for Healthcare Providers:  SeriousBroker.it  This test is no t yet approved or cleared by the [place] FDA and  has been authorized for detection and/or diagnosis of SARS-CoV-2 by FDA under an Emergency Use Authorization (EUA). This EUA will remain  in effect (meaning this test can be used) for the duration of the COVID-19 declaration under Section 564(b)(1) of the Act, 21 U.S.C.section 360bbb-3(b)(1), unless the authorization is terminated  or revoked sooner.       Influenza A by PCR NEGATIVE NEGATIVE Final   Influenza B by PCR NEGATIVE NEGATIVE Final    Comment: (NOTE) The Xpert Xpress SARS-CoV-2/FLU/RSV plus assay is intended as an aid in the diagnosis of influenza from Nasopharyngeal swab specimens and should not be used as a sole basis for treatment. Nasal washings and aspirates are unacceptable for Xpert Xpress SARS-CoV-2/FLU/RSV testing.  Fact Sheet for Patients: BloggerCourse.com  Fact Sheet for Healthcare Providers: SeriousBroker.it  This test is not yet approved or cleared by the [place] FDA and has been authorized for detection and/or diagnosis of SARS-CoV-2 by FDA under an Emergency Use Authorization (EUA). This EUA will remain in effect (meaning this test can be used) for the duration of the COVID-19 declaration under Section 564(b)(1) of the Act, 21 U.S.C. section 360bbb-3(b)(1), unless the authorization is terminated or revoked.  Performed at [facility], [address] [place], [place] [address]          Radiology Studies: CARDIAC CATHETERIZATION  Result Date: 11/13/2020  LV end diastolic pressure is mildly elevated. LVEDP 22 mm Hg.  There is no aortic valve stenosis.  No angiographically apparent CAD.  Ao 98%, PA sat 77%, PA pressure 33/19, mean PA 26 mm Hg; PCWP 19 mm Hg; CO 6.7 L/min; CI 3.2  Hemodynamic findings consistent with mild pulmonary hypertension.   Nonischemic cardiomyopathy.  Medical therapy.  Continue anticoagulation for LV thrombus. Due to brachial vein blood being very red,  we checked a saturation after the procedure from the brachial sheath.  O2 sats averaged 94.5% from the brachial sheath.  Venogram was done through the sheath which did not reveal any arterial flow toward the hand.  Sheath was certainly in a vein, but it seems likely there may be an AV fistula somewhere in the proximal forearm.    DG CHEST PORT 1 VIEW  Result Date: 11/12/2020 CLINICAL DATA:  Cough, fever and asthma. EXAM: PORTABLE CHEST 1 VIEW COMPARISON:  11/10/2020 FINDINGS: The heart size and mediastinal contours are within normal limits. There is mild increase interstitial markings noted peripherally suggesting mild edema. No superimposed airspace consolidation identified. The visualized skeletal structures are unremarkable. IMPRESSION: 1. Mild interstitial edema. 2. No airspace consolidation or pleural effusion. Electronically Signed   By: Signa Kell M.D.   On: 11/12/2020 08:55        Scheduled Meds: . aspirin EC  81 mg Oral QPM  . budesonide (PULMICORT) nebulizer solution  0.5 mg Nebulization BID  . folic acid  1 mg Oral Daily  . insulin aspart  0-20 Units Subcutaneous TID WC  . insulin aspart protamine- aspart  30 Units Subcutaneous BID WC  . ipratropium-albuterol  3 mL Nebulization BID  . losartan  25 mg Oral QPM  . methylPREDNISolone (SOLU-MEDROL) injection  40 mg Intravenous Q12H  . metoprolol succinate  25 mg Oral Daily  . multivitamin with minerals  1 tablet Oral Daily  . sodium chloride flush  3 mL Intravenous Q12H  . sodium chloride flush  3 mL Intravenous Q12H  . thiamine  100 mg Oral Daily   Or  . thiamine  100 mg Intravenous Daily  . umeclidinium bromide  1 puff Inhalation Daily   Continuous Infusions: . sodium chloride    . heparin       LOS: 2 days    Time spent: 35 minutes.     Alba Cory, MD [facility]   If 7PM-7AM, please contact night-coverage [email or web address]  11/13/2020, 2:22 PM

## 2020-11-13 NOTE — H&P (View-Only) (Signed)
Progress Note  Patient Name: Christopher Cross Date of Encounter: 11/13/2020  [facility] Cardiologist: Dina Rich, MD   Subjective   No chest pain last night. Still wheezing.   Inpatient Medications    Scheduled Meds: . aspirin EC  81 mg Oral QPM  . budesonide (PULMICORT) nebulizer solution  0.5 mg Nebulization BID  . folic acid  1 mg Oral Daily  . insulin aspart  0-20 Units Subcutaneous TID WC  . insulin aspart protamine- aspart  30 Units Subcutaneous BID WC  . ipratropium-albuterol  3 mL Nebulization BID  . losartan  25 mg Oral QPM  . methylPREDNISolone (SOLU-MEDROL) injection  40 mg Intravenous Q12H  . metoprolol succinate  25 mg Oral Daily  . multivitamin with minerals  1 tablet Oral Daily  . sodium chloride flush  3 mL Intravenous Q12H  . thiamine  100 mg Oral Daily   Or  . thiamine  100 mg Intravenous Daily   Continuous Infusions: . sodium chloride    . sodium chloride 10 mL/hr at 11/13/20 0036  . heparin 1,400 Units/hr (11/13/20 0032)   PRN Meds: sodium chloride, acetaminophen **OR** acetaminophen, albuterol, LORazepam **OR** LORazepam, nicotine, nitroGLYCERIN, prochlorperazine, sodium chloride flush   Vital Signs    Vitals:   11/12/20 1942 11/12/20 2010 11/13/20 0606 11/13/20 0609  BP:  (!) 147/108 (!) 163/103 (!) 155/106  Pulse:  89  79  Resp:  18  17  Temp:  98.3 F (36.8 C)  97.8 F (36.6 C)  TempSrc:  Oral Oral Oral  SpO2: 99% 98%  92%  Weight:    91.4 kg  Height:        Intake/Output Summary (Last 24 hours) at 11/13/2020 0815 Last data filed at 11/13/2020 5784 Gross per 24 hour  Intake 1000 ml  Output 5000 ml  Net -4000 ml   Last 3 Weights 11/13/2020 11/12/2020 11/11/2020  Weight (lbs) 201 lb 8 oz 201 lb 15.1 oz 204 lb 1.6 oz  Weight (kg) 91.4 kg 91.6 kg 92.579 kg      Telemetry    NSR without significant ventricular ectopy - Personally Reviewed  ECG    NSR with inferolateral TWI.  - Personally Reviewed  Physical Exam    GEN: No acute distress.   Neck: No JVD Cardiac: RRR, no murmurs, rubs, or gallops.  Respiratory: some wheezing this morning.  GI: Soft, nontender, non-distended  MS: No edema; No deformity. Neuro:  Nonfocal  Psych: Normal affect   Labs    High Sensitivity Troponin:   Recent Labs  Lab 11/10/20 2009 11/10/20 2306  TROPONINIHS 62* 43*      Chemistry Recent Labs  Lab 11/11/20 0428 11/11/20 0428 11/11/20 0935 11/11/20 0935 11/11/20 1813 11/12/20 0000 11/13/20 0212  NA 132*   < > 134*  --   --  137 135  K 3.9   < > 4.0  --   --  4.0 4.2  CL 100   < > 103  --   --  104 102  CO2 23   < > 23  --   --  23 22  GLUCOSE 426*   < > 322*   < > 334* 362* 365*  BUN 11   < > 12  --   --  16 18  CREATININE 0.74   < > 0.71  --   --  0.67 0.92  CALCIUM 8.5*   < > 8.5*  --   --  8.5* 8.9  PROT 6.9  --  6.9  --   --  5.7*  --   ALBUMIN 3.6  --  3.6  --   --  3.0*  --   AST 94*  --  74*  --   --  58*  --   ALT 148*  --  139*  --   --  125*  --   ALKPHOS 93  --  88  --   --  68  --   BILITOT 0.7  --  0.4  --   --  0.5  --   GFRNONAA >60   < > >60  --   --  >60 >60  ANIONGAP 9   < > 8  --   --  10 11   < > = values in this interval not displayed.     Hematology Recent Labs  Lab 11/11/20 0935 11/12/20 0000 11/13/20 0212  WBC 12.8* 13.0* 12.5*  RBC 4.73 4.20* 4.37  HGB 15.2 13.6 14.3  HCT 43.8 39.3 41.4  MCV 92.6 93.6 94.7  MCH 32.1 32.4 32.7  MCHC 34.7 34.6 34.5  RDW 11.5 11.9 11.9  PLT 199 180 172    BNP Recent Labs  Lab 11/10/20 2009  BNP 360.0*     DDimer  Recent Labs  Lab 11/10/20 2009  DDIMER 1.20*     Radiology    DG CHEST PORT 1 VIEW  Result Date: 11/12/2020 CLINICAL DATA:  Cough, fever and asthma. EXAM: PORTABLE CHEST 1 VIEW COMPARISON:  11/10/2020 FINDINGS: The heart size and mediastinal contours are within normal limits. There is mild increase interstitial markings noted peripherally suggesting mild edema. No superimposed airspace consolidation  identified. The visualized skeletal structures are unremarkable. IMPRESSION: 1. Mild interstitial edema. 2. No airspace consolidation or pleural effusion. Electronically Signed   By: Signa Kell M.D.   On: 11/12/2020 08:55   ECHOCARDIOGRAM COMPLETE  Result Date: 11/11/2020    ECHOCARDIOGRAM REPORT   Patient Name:   Christopher Cross Date of Exam: 11/11/2020 Medical Rec #:  [id number]     Height:       69.0 in Accession #:    [id number]    Weight:       200.0 lb Date of Birth:  1966-12-18     BSA:          2.066 m Patient Age:    54 years      BP:           145/102 mmHg Patient Gender: M             HR:           84 bpm. [facility] Procedure: Intracardiac Opacification Agent Indications:    Elevated Troponin  History:        Patient has prior history of Echocardiogram examinations, most                 recent 07/16/2014. COPD; Risk Factors:Current Smoker, Diabetes                 and Hypertension. Elevated Troponin.  Sonographer:    Jeryl Columbia RDCS (AE) Referring Phys: [id number] DAVID MANUEL ORTIZ IMPRESSIONS  1. Left ventricular ejection fraction, by estimation, is 30 to 35%. The left ventricle has moderately decreased function. The left ventricle demonstrates regional wall motion abnormalities (see scoring diagram/findings for description). Suggestive of possible stress induced cardiomyopathy although ischemic cardiomyopathy is not excluded. There is mild left ventricular  hypertrophy. Left ventricular diastolic parameters are indeterminate. Definity contrast utilzed with evidence of apical LV thrombus, some of which is loosely organized.  2. Right ventricular systolic function is normal. The right ventricular size is normal. Tricuspid regurgitation signal is inadequate for assessing PA pressure.  3. The mitral valve is grossly normal. Trivial mitral valve regurgitation.  4. The aortic valve is tricuspid. Aortic valve regurgitation is not visualized.  5. The inferior vena cava is normal in size  with greater than 50% respiratory variability, suggesting right atrial pressure of 3 mmHg. FINDINGS  Left Ventricle: Left ventricular ejection fraction, by estimation, is 30 to 35%. The left ventricle has moderately decreased function. The left ventricle demonstrates regional wall motion abnormalities. Definity contrast agent was given IV to delineate the left ventricular endocardial borders. The left ventricular internal cavity size was normal in size. There is mild left ventricular hypertrophy. Left ventricular diastolic parameters are indeterminate.  LV Wall Scoring: The entire apex, mid anterolateral segment, and mid inferoseptal segment are akinetic. The mid anterior segment and mid inferior segment are hypokinetic. The basal anterolateral segment, basal anterior segment, basal inferior segment, and basal inferoseptal segment are normal. Right Ventricle: The right ventricular size is normal. No increase in right ventricular wall thickness. Right ventricular systolic function is normal. Tricuspid regurgitation signal is inadequate for assessing PA pressure. Left Atrium: Left atrial size was normal in size. Right Atrium: Right atrial size was normal in size. Pericardium: There is no evidence of pericardial effusion. Presence of pericardial fat pad. Mitral Valve: The mitral valve is grossly normal. Trivial mitral valve regurgitation. Tricuspid Valve: The tricuspid valve is grossly normal. Tricuspid valve regurgitation is trivial. Aortic Valve: The aortic valve is tricuspid. There is mild aortic valve annular calcification. Aortic valve regurgitation is not visualized. Pulmonic Valve: The pulmonic valve was grossly normal. Pulmonic valve regurgitation is trivial. Aorta: The aortic root is normal in size and structure. Venous: The inferior vena cava is normal in size with greater than 50% respiratory variability, suggesting right atrial pressure of 3 mmHg. IAS/Shunts: No atrial level shunt detected by color flow  Doppler.  LEFT VENTRICLE PLAX 2D LVIDd:         5.48 cm  Diastology LVIDs:         3.89 cm  LV e' medial:    6.09 cm/s LV PW:         1.13 cm  LV E/e' medial:  11.7 LV IVS:        0.86 cm  LV e' lateral:   5.87 cm/s LVOT diam:     2.00 cm  LV E/e' lateral: 12.1 LVOT Area:     3.14 cm  RIGHT VENTRICLE RV S prime:     15.20 cm/s TAPSE (M-mode): 1.8 cm LEFT ATRIUM             Index       RIGHT ATRIUM           Index LA diam:        3.60 cm 1.74 cm/m  RA Area:     11.20 cm LA Vol (A2C):   56.5 ml 27.35 ml/m RA Volume:   26.30 ml  12.73 ml/m LA Vol (A4C):   55.7 ml 26.96 ml/m LA Biplane Vol: 55.8 ml 27.01 ml/m   AORTA Ao Root diam: 2.70 cm MITRAL VALVE MV Area (PHT): 3.97 cm    SHUNTS MV Decel Time: 191 msec    Systemic Diam: 2.00 cm MV E velocity: 71.10 cm/s  MV A velocity: 73.30 cm/s MV E/A ratio:  0.97 Samuel Mcdowell MD Electronically signed by Samuel Mcdowell MD Signature Date/Time: 11/11/2020/11:01:21 AM    Final    US Abdomen Limited RUQ (LIVER/GB)  Result Date: 11/11/2020 CLINICAL DATA:  Transaminasemia EXAM: ULTRASOUND ABDOMEN LIMITED RIGHT UPPER QUADRANT COMPARISON:  07/22/2020 CT renal stone. FINDINGS: Gallbladder: No gallstones or wall thickening visualized. No sonographic Murphy sign noted by sonographer. Common bile duct: Diameter: 5.0 mm Liver: No focal lesion identified. Increased parenchymal echogenicity. Portal vein is patent on color Doppler imaging with normal direction of blood flow towards the liver. Other: None. IMPRESSION: Hepatic steatosis.  No focal hepatic lesion. Electronically Signed   By: Chikanele  Emekauwa M.D.   On: 11/11/2020 09:41    Cardiac Studies   Echo 11/11/2020 1. Left ventricular ejection fraction, by estimation, is 30 to 35%. The  left ventricle has moderately decreased function. The left ventricle  demonstrates regional wall motion abnormalities (see scoring  diagram/findings for description). Suggestive of  possible stress induced cardiomyopathy although  ischemic cardiomyopathy is  not excluded. There is mild left ventricular hypertrophy. Left ventricular  diastolic parameters are indeterminate. Definity contrast utilzed with  evidence of apical LV thrombus,  some of which is loosely organized.  2. Right ventricular systolic function is normal. The right ventricular  size is normal. Tricuspid regurgitation signal is inadequate for assessing  PA pressure.  3. The mitral valve is grossly normal. Trivial mitral valve  regurgitation.  4. The aortic valve is tricuspid. Aortic valve regurgitation is not  visualized.  5. The inferior vena cava is normal in size with greater than 50%  respiratory variability, suggesting right atrial pressure of 3 mmHg.  Patient Profile     54 y.o. male with PMH of HTN, DM II, COPD, alcohol and tobacco abuse presented with worsening dyspnea and found to have elevated troponin, low EF by echo and LV thrombus. He was [facility].   Assessment & Plan    1. Acute systolic heart failure  - no prolonged chest pain recently, presented with dyspnea.  - Echocardiogram showed EF 30-35% with wall motion abnormality and apical thrombus. Unclear if stress induced, ischemic or EtOH cardiomyopathy.   - plan for left and right heart cath today  - continue with toprol XL and losartan, plan to transition losartan to entresto prior to discharge.   - Risk and benefit of procedure explained to the patient who display clear understanding and agree to proceed. Discussed with patient possible procedural risk include bleeding, vascular injury, renal injury, arrythmia, MI, stroke and loss of limb or life.  2. LV thrombus  - on IV heparin waiting for cath, plan to initiate systemic anticoagulation therapy after cath  3. HTN  4. DM II  5. Tobacco and alcohol abuse: reportedly had significant EtOH consumption. Discussed importance of alcohol cessation       For questions  or updates, please contact [facility] Please consult [email or web address] for contact info under        Signed, Hao Meng, PA  11/13/2020, 8:15 AM    History and all data above reviewed.  Patient examined.  I agree with the findings as above.  No chest pain.  No SOB.  The patient exam reveals COR:RRR  ,  Lungs: Clear  ,  Abd: Positive bowel sounds, no rebound no guarding, Ext No edema  .  All available labs, radiology testing, previous records reviewed. Agree with documented assessment   and plan.   Cardiomyopathy:  Cath to rule out CAD.  Also discussed the possibility that this could be alcohol.    EKG and echo are also consistent with takotsubo.   On low dose beta blocker and ARB.  We will titrate.  He will need education on salt and fluid.  Continue med titration.  LV Thrombus:  Echo reviewed.  No LV gram. Will need DOAC at discharge.     Rollene Rotunda  8:53 AM  11/13/2020

## 2020-11-13 NOTE — Interval H&P Note (Signed)
Cath Lab Visit (complete for each Cath Lab visit)  Clinical Evaluation Leading to the Procedure:   ACS: Yes.    Non-ACS:    Anginal Classification: CCS IV  Anti-ischemic medical therapy: Minimal Therapy (1 class of medications)  Non-Invasive Test Results: No non-invasive testing performed  Prior CABG: No previous CABG   Low EF by echo   History and Physical Interval Note:  11/13/2020 9:14 AM  Christopher Cross  has presented today for surgery, with the diagnosis of cardiomyopathy.  The various methods of treatment have been discussed with the patient and family. After consideration of risks, benefits and other options for treatment, the patient has consented to  Procedure(s): RIGHT/LEFT HEART CATH AND CORONARY ANGIOGRAPHY (N/A) as a surgical intervention.  The patient's history has been reviewed, patient examined, no change in status, stable for surgery.  I have reviewed the patient's chart and labs.  Questions were answered to the patient's satisfaction.     Lance Muss

## 2020-11-13 NOTE — Progress Notes (Addendum)
ANTICOAGULATION CONSULT NOTE  Pharmacy Consult for Heparin Indication: apical thrombus  No Known Allergies  Patient Measurements: Height: 5\' 9"  (175.3 cm) Weight: 91.4 kg (201 lb 8 oz) IBW/kg (Calculated) : 70.7 HEPARIN DW (KG): 89.6  Vital Signs: Temp: 97.8 F (36.6 C) (11/26 0609) Temp Source: Oral (11/26 0609) BP: 155/106 (11/26 0609) Pulse Rate: 79 (11/26 0609)  Labs: Recent Labs    11/10/20 2009 11/10/20 2306 11/11/20 0428 11/11/20 0935 11/11/20 0935 11/11/20 1813 11/12/20 0000 11/13/20 0212  HGB 14.3  --    < > 15.2   < >  --  13.6 14.3  HCT 41.1  --    < > 43.8  --   --  39.3 41.4  PLT 167  --    < > 199  --   --  180 172  HEPARINUNFRC  --   --   --   --   --  0.60 0.51 0.27*  CREATININE 0.81  --    < > 0.71  --   --  0.67 0.92  TROPONINIHS 62* 43*  --   --   --   --   --   --    < > = values in this interval not displayed.    Estimated Creatinine Clearance: 105 mL/min (by C-G formula based on SCr of 0.92 mg/dL).   Medical History: Past Medical History:  Diagnosis Date  . Asthma   . Diabetes mellitus without complication (HCC)   . Other emphysema (HCC)   . Unspecified essential hypertension     Medications:  Medications Prior to Admission  Medication Sig Dispense Refill Last Dose  . acetaminophen (TYLENOL) 500 MG tablet Take 1,000 mg every 6 (six) hours as needed by mouth for headache.   Past Month at Unknown time  . amLODipine (NORVASC) 10 MG tablet Take 1 tablet (10 mg total) by mouth daily. (Patient taking differently: Take 10 mg by mouth every evening. ) 15 tablet 0 Past Week at Unknown time  . aspirin EC 81 MG tablet Take 1 tablet (81 mg total) by mouth daily. (Patient taking differently: Take 81 mg by mouth every evening. )   Past Week at Unknown time  . Aspirin-Salicylamide-Caffeine (BC HEADACHE POWDER PO) Take 1 packet daily as needed by mouth (headache).   Past Month at Unknown time  . atenolol (TENORMIN) 50 MG tablet TAKE 1 TABLET BY MOUTH  DAILY. (Patient taking differently: Take 50 mg by mouth every evening. ) 30 tablet 0 Past Week at Unknown time  . atorvastatin (LIPITOR) 40 MG tablet Take 40 mg by mouth every evening.    Past Week at Unknown time  . calcium carbonate (TUMS - DOSED IN MG ELEMENTAL CALCIUM) 500 MG chewable tablet Chew 2 tablets daily as needed by mouth for indigestion or heartburn.   Past Week at Unknown time  . ibuprofen (ADVIL,MOTRIN) 200 MG tablet Take 400 mg every 6 (six) hours as needed by mouth for headache.   Past Month at Unknown time  . Insulin Glargine (BASAGLAR KWIKPEN) 100 UNIT/ML Inject 30 Units into the skin at bedtime.   Past Week at Unknown time  . losartan (COZAAR) 25 MG tablet Take 25 mg by mouth every evening.    Past Week at Unknown time  . metFORMIN (GLUCOPHAGE) 1000 MG tablet Take 1 tablet (1,000 mg total) 2 (two) times daily by mouth. (Patient taking differently: Take 2,000 mg by mouth every evening. ) 30 tablet 0 Past Week at Unknown time  .  Multiple Vitamin (MULTIVITAMIN WITH MINERALS) TABS tablet Take 1 tablet by mouth every evening.    Past Week at Unknown time  . PROAIR HFA 108 (90 Base) MCG/ACT inhaler INHALE 1 TO 2 PUFFS EVERY SIX HOURS AS NEEDED FOR WHEEZING OR SHORTNESS OF BREATH (Patient taking differently: Inhale 1-2 puffs into the lungs every 6 (six) hours as needed for wheezing or shortness of breath. ) 8.5 g 2 Past Month at Unknown time  . sitaGLIPtin (JANUVIA) 100 MG tablet Take 100 mg by mouth every evening.    Past Month at Unknown time  . SPIRIVA HANDIHALER 18 MCG inhalation capsule Place 1 capsule into inhaler and inhale daily.   Past Week at Unknown time  . nitroGLYCERIN (NITROSTAT) 0.4 MG SL tablet Place 0.4 mg under the tongue every 5 (five) minutes as needed for chest pain. (Patient not taking: Reported on 11/11/2020)   Not Taking at Unknown time  . ondansetron (ZOFRAN ODT) 4 MG disintegrating tablet Take 1 tablet (4 mg total) by mouth every 8 (eight) hours as needed for up to  10 doses for nausea or vomiting. (Patient not taking: Reported on 11/11/2020) 10 tablet 0 Not Taking at Unknown time  . oxyCODONE-acetaminophen (PERCOCET/ROXICET) 5-325 MG tablet Take 2 tablets by mouth every 6 (six) hours as needed for up to 12 doses for severe pain. (Patient not taking: Reported on 11/11/2020) 12 tablet 0 Not Taking at Unknown time  . sildenafil (VIAGRA) 100 MG tablet TAKE 1/2 TAB 30 MINS PRIOR (Patient not taking: Reported on 11/11/2020) 4 tablet 0 Not Taking at Unknown time    Assessment: 44 yom presented with shortness of breath and cough started on IV heparin for apical LV thrombus. No prior to admission anticoagulation. Anticipate cardiac cath today. History of heavy alcohol use noted -heparin level at goal   Goal of Therapy:  Heparin level 0.3-0.7 units/ml Monitor platelets by anticoagulation protocol: Yes   Plan:  Increase heparin to 100 units/hour Daily heparin level and CBC Will follow plans post cath  Harland German, PharmD Clinical Pharmacist **Pharmacist phone directory can now be found on [email or web address] ([email or web address]).  Listed under [facility].  Addendum -s/p cath with no CAD -heparin to restart 8 hours post sheath removal  Plan -Restart heparin 1500 units/hr at 6pm -Heparin level daily wth CBC daily   Harland German, PharmD Clinical Pharmacist **Pharmacist phone directory can now be found on [email or web address] ([email or web address]).  Listed under [facility].

## 2020-11-13 NOTE — Progress Notes (Addendum)
Progress Note  Patient Name: Christopher Cross Date of Encounter: 11/13/2020  [facility] Cardiologist: Dina Rich, MD   Subjective   No chest pain last night. Still wheezing.   Inpatient Medications    Scheduled Meds: . aspirin EC  81 mg Oral QPM  . budesonide (PULMICORT) nebulizer solution  0.5 mg Nebulization BID  . folic acid  1 mg Oral Daily  . insulin aspart  0-20 Units Subcutaneous TID WC  . insulin aspart protamine- aspart  30 Units Subcutaneous BID WC  . ipratropium-albuterol  3 mL Nebulization BID  . losartan  25 mg Oral QPM  . methylPREDNISolone (SOLU-MEDROL) injection  40 mg Intravenous Q12H  . metoprolol succinate  25 mg Oral Daily  . multivitamin with minerals  1 tablet Oral Daily  . sodium chloride flush  3 mL Intravenous Q12H  . thiamine  100 mg Oral Daily   Or  . thiamine  100 mg Intravenous Daily   Continuous Infusions: . sodium chloride    . sodium chloride 10 mL/hr at 11/13/20 0036  . heparin 1,400 Units/hr (11/13/20 0032)   PRN Meds: sodium chloride, acetaminophen **OR** acetaminophen, albuterol, LORazepam **OR** LORazepam, nicotine, nitroGLYCERIN, prochlorperazine, sodium chloride flush   Vital Signs    Vitals:   11/12/20 1942 11/12/20 2010 11/13/20 0606 11/13/20 0609  BP:  (!) 147/108 (!) 163/103 (!) 155/106  Pulse:  89  79  Resp:  18  17  Temp:  98.3 F (36.8 C)  97.8 F (36.6 C)  TempSrc:  Oral Oral Oral  SpO2: 99% 98%  92%  Weight:    91.4 kg  Height:        Intake/Output Summary (Last 24 hours) at 11/13/2020 0815 Last data filed at 11/13/2020 5784 Gross per 24 hour  Intake 1000 ml  Output 5000 ml  Net -4000 ml   Last 3 Weights 11/13/2020 11/12/2020 11/11/2020  Weight (lbs) 201 lb 8 oz 201 lb 15.1 oz 204 lb 1.6 oz  Weight (kg) 91.4 kg 91.6 kg 92.579 kg      Telemetry    NSR without significant ventricular ectopy - Personally Reviewed  ECG    NSR with inferolateral TWI.  - Personally Reviewed  Physical Exam    GEN: No acute distress.   Neck: No JVD Cardiac: RRR, no murmurs, rubs, or gallops.  Respiratory: some wheezing this morning.  GI: Soft, nontender, non-distended  MS: No edema; No deformity. Neuro:  Nonfocal  Psych: Normal affect   Labs    High Sensitivity Troponin:   Recent Labs  Lab 11/10/20 2009 11/10/20 2306  TROPONINIHS 62* 43*      Chemistry Recent Labs  Lab 11/11/20 0428 11/11/20 0428 11/11/20 0935 11/11/20 0935 11/11/20 1813 11/12/20 0000 11/13/20 0212  NA 132*   < > 134*  --   --  137 135  K 3.9   < > 4.0  --   --  4.0 4.2  CL 100   < > 103  --   --  104 102  CO2 23   < > 23  --   --  23 22  GLUCOSE 426*   < > 322*   < > 334* 362* 365*  BUN 11   < > 12  --   --  16 18  CREATININE 0.74   < > 0.71  --   --  0.67 0.92  CALCIUM 8.5*   < > 8.5*  --   --  8.5* 8.9  PROT 6.9  --  6.9  --   --  5.7*  --   ALBUMIN 3.6  --  3.6  --   --  3.0*  --   AST 94*  --  74*  --   --  58*  --   ALT 148*  --  139*  --   --  125*  --   ALKPHOS 93  --  88  --   --  68  --   BILITOT 0.7  --  0.4  --   --  0.5  --   GFRNONAA >60   < > >60  --   --  >60 >60  ANIONGAP 9   < > 8  --   --  10 11   < > = values in this interval not displayed.     Hematology Recent Labs  Lab 11/11/20 0935 11/12/20 0000 11/13/20 0212  WBC 12.8* 13.0* 12.5*  RBC 4.73 4.20* 4.37  HGB 15.2 13.6 14.3  HCT 43.8 39.3 41.4  MCV 92.6 93.6 94.7  MCH 32.1 32.4 32.7  MCHC 34.7 34.6 34.5  RDW 11.5 11.9 11.9  PLT 199 180 172    BNP Recent Labs  Lab 11/10/20 2009  BNP 360.0*     DDimer  Recent Labs  Lab 11/10/20 2009  DDIMER 1.20*     Radiology    DG CHEST PORT 1 VIEW  Result Date: 11/12/2020 CLINICAL DATA:  Cough, fever and asthma. EXAM: PORTABLE CHEST 1 VIEW COMPARISON:  11/10/2020 FINDINGS: The heart size and mediastinal contours are within normal limits. There is mild increase interstitial markings noted peripherally suggesting mild edema. No superimposed airspace consolidation  identified. The visualized skeletal structures are unremarkable. IMPRESSION: 1. Mild interstitial edema. 2. No airspace consolidation or pleural effusion. Electronically Signed   By: Signa Kell M.D.   On: 11/12/2020 08:55   ECHOCARDIOGRAM COMPLETE  Result Date: 11/11/2020    ECHOCARDIOGRAM REPORT   Patient Name:   Christopher Cross Date of Exam: 11/11/2020 Medical Rec #:  [id number]     Height:       69.0 in Accession #:    [id number]    Weight:       200.0 lb Date of Birth:  1966-12-18     BSA:          2.066 m Patient Age:    54 years      BP:           145/102 mmHg Patient Gender: M             HR:           84 bpm. [facility] Procedure: Intracardiac Opacification Agent Indications:    Elevated Troponin  History:        Patient has prior history of Echocardiogram examinations, most                 recent 07/16/2014. COPD; Risk Factors:Current Smoker, Diabetes                 and Hypertension. Elevated Troponin.  Sonographer:    Jeryl Columbia RDCS (AE) Referring Phys: [id number] DAVID MANUEL ORTIZ IMPRESSIONS  1. Left ventricular ejection fraction, by estimation, is 30 to 35%. The left ventricle has moderately decreased function. The left ventricle demonstrates regional wall motion abnormalities (see scoring diagram/findings for description). Suggestive of possible stress induced cardiomyopathy although ischemic cardiomyopathy is not excluded. There is mild left ventricular  hypertrophy. Left ventricular diastolic parameters are indeterminate. Definity contrast utilzed with evidence of apical LV thrombus, some of which is loosely organized.  2. Right ventricular systolic function is normal. The right ventricular size is normal. Tricuspid regurgitation signal is inadequate for assessing PA pressure.  3. The mitral valve is grossly normal. Trivial mitral valve regurgitation.  4. The aortic valve is tricuspid. Aortic valve regurgitation is not visualized.  5. The inferior vena cava is normal in size  with greater than 50% respiratory variability, suggesting right atrial pressure of 3 mmHg. FINDINGS  Left Ventricle: Left ventricular ejection fraction, by estimation, is 30 to 35%. The left ventricle has moderately decreased function. The left ventricle demonstrates regional wall motion abnormalities. Definity contrast agent was given IV to delineate the left ventricular endocardial borders. The left ventricular internal cavity size was normal in size. There is mild left ventricular hypertrophy. Left ventricular diastolic parameters are indeterminate.  LV Wall Scoring: The entire apex, mid anterolateral segment, and mid inferoseptal segment are akinetic. The mid anterior segment and mid inferior segment are hypokinetic. The basal anterolateral segment, basal anterior segment, basal inferior segment, and basal inferoseptal segment are normal. Right Ventricle: The right ventricular size is normal. No increase in right ventricular wall thickness. Right ventricular systolic function is normal. Tricuspid regurgitation signal is inadequate for assessing PA pressure. Left Atrium: Left atrial size was normal in size. Right Atrium: Right atrial size was normal in size. Pericardium: There is no evidence of pericardial effusion. Presence of pericardial fat pad. Mitral Valve: The mitral valve is grossly normal. Trivial mitral valve regurgitation. Tricuspid Valve: The tricuspid valve is grossly normal. Tricuspid valve regurgitation is trivial. Aortic Valve: The aortic valve is tricuspid. There is mild aortic valve annular calcification. Aortic valve regurgitation is not visualized. Pulmonic Valve: The pulmonic valve was grossly normal. Pulmonic valve regurgitation is trivial. Aorta: The aortic root is normal in size and structure. Venous: The inferior vena cava is normal in size with greater than 50% respiratory variability, suggesting right atrial pressure of 3 mmHg. IAS/Shunts: No atrial level shunt detected by color flow  Doppler.  LEFT VENTRICLE PLAX 2D LVIDd:         5.48 cm  Diastology LVIDs:         3.89 cm  LV e' medial:    6.09 cm/s LV PW:         1.13 cm  LV E/e' medial:  11.7 LV IVS:        0.86 cm  LV e' lateral:   5.87 cm/s LVOT diam:     2.00 cm  LV E/e' lateral: 12.1 LVOT Area:     3.14 cm  RIGHT VENTRICLE RV S prime:     15.20 cm/s TAPSE (M-mode): 1.8 cm LEFT ATRIUM             Index       RIGHT ATRIUM           Index LA diam:        3.60 cm 1.74 cm/m  RA Area:     11.20 cm LA Vol (A2C):   56.5 ml 27.35 ml/m RA Volume:   26.30 ml  12.73 ml/m LA Vol (A4C):   55.7 ml 26.96 ml/m LA Biplane Vol: 55.8 ml 27.01 ml/m   AORTA Ao Root diam: 2.70 cm MITRAL VALVE MV Area (PHT): 3.97 cm    SHUNTS MV Decel Time: 191 msec    Systemic Diam: 2.00 cm MV E velocity: 71.10 cm/s  MV A velocity: 73.30 cm/s MV E/A ratio:  0.97 Nona Dell MD Electronically signed by Nona Dell MD Signature Date/Time: 11/11/2020/11:01:21 AM    Final    US Abdomen Limited RUQ (LIVER/GB)  Result Date: 11/11/2020 CLINICAL DATA:  Transaminasemia EXAM: ULTRASOUND ABDOMEN LIMITED RIGHT UPPER QUADRANT COMPARISON:  07/22/2020 CT renal stone. FINDINGS: Gallbladder: No gallstones or wall thickening visualized. No sonographic Murphy sign noted by sonographer. Common bile duct: Diameter: 5.0 mm Liver: No focal lesion identified. Increased parenchymal echogenicity. Portal vein is patent on color Doppler imaging with normal direction of blood flow towards the liver. Other: None. IMPRESSION: Hepatic steatosis.  No focal hepatic lesion. Electronically Signed   By: Stana Bunting M.D.   On: 11/11/2020 09:41    Cardiac Studies   Echo 11/11/2020 1. Left ventricular ejection fraction, by estimation, is 30 to 35%. The  left ventricle has moderately decreased function. The left ventricle  demonstrates regional wall motion abnormalities (see scoring  diagram/findings for description). Suggestive of  possible stress induced cardiomyopathy although  ischemic cardiomyopathy is  not excluded. There is mild left ventricular hypertrophy. Left ventricular  diastolic parameters are indeterminate. Definity contrast utilzed with  evidence of apical LV thrombus,  some of which is loosely organized.  2. Right ventricular systolic function is normal. The right ventricular  size is normal. Tricuspid regurgitation signal is inadequate for assessing  PA pressure.  3. The mitral valve is grossly normal. Trivial mitral valve  regurgitation.  4. The aortic valve is tricuspid. Aortic valve regurgitation is not  visualized.  5. The inferior vena cava is normal in size with greater than 50%  respiratory variability, suggesting right atrial pressure of 3 mmHg.  Patient Profile     54 y.o. male with PMH of HTN, DM II, COPD, alcohol and tobacco abuse presented with worsening dyspnea and found to have elevated troponin, low EF by echo and LV thrombus. [facility].   Assessment & Plan    1. Acute systolic heart failure  - no prolonged chest pain recently, presented with dyspnea.  - Echocardiogram showed EF 30-35% with wall motion abnormality and apical thrombus. Unclear if stress induced, ischemic or EtOH cardiomyopathy.   - plan for left and right heart cath today  - continue with toprol XL and losartan, plan to transition losartan to entresto prior to discharge.   - Risk and benefit of procedure explained to the patient who display clear understanding and agree to proceed. Discussed with patient possible procedural risk include bleeding, vascular injury, renal injury, arrythmia, MI, stroke and loss of limb or life.  2. LV thrombus  - on IV heparin waiting for cath, plan to initiate systemic anticoagulation therapy after cath  3. HTN  4. DM II  5. Tobacco and alcohol abuse: reportedly had significant EtOH consumption. Discussed importance of alcohol cessation       For questions  or updates, please contact [facility] Please consult [email or web address] for contact info under        Signed, Azalee Course, PA  11/13/2020, 8:15 AM    History and all data above reviewed.  Patient examined.  I agree with the findings as above.  No chest pain.  No SOB.  The patient exam reveals COR:RRR  ,  Lungs: Clear  ,  Abd: Positive bowel sounds, no rebound no guarding, Ext No edema  .  All available labs, radiology testing, previous records reviewed. Agree with documented assessment  and plan.   Cardiomyopathy:  Cath to rule out CAD.  Also discussed the possibility that this could be alcohol.    EKG and echo are also consistent with takotsubo.   On low dose beta blocker and ARB.  We will titrate.  He will need education on salt and fluid.  Continue med titration.  LV Thrombus:  Echo reviewed.  No LV gram. Will need DOAC at discharge.     Rollene Rotunda  8:53 AM  11/13/2020

## 2020-11-14 LAB — BASIC METABOLIC PANEL
Anion gap: 10 (ref 5–15)
BUN: 10 mg/dL (ref 6–20)
CO2: 25 mmol/L (ref 22–32)
Calcium: 8.7 mg/dL — ABNORMAL LOW (ref 8.9–10.3)
Chloride: 99 mmol/L (ref 98–111)
Creatinine, Ser: 0.69 mg/dL (ref 0.61–1.24)
GFR, Estimated: >60 mL/min (ref >60)
Glucose, Bld: 320 mg/dL — ABNORMAL HIGH (ref 70–99)
Potassium: 4.1 mmol/L (ref 3.5–5.1)
Sodium: 134 mmol/L — ABNORMAL LOW (ref 135–145)

## 2020-11-14 LAB — GLUCOSE, CAPILLARY
Glucose-Capillary: 289 mg/dL — ABNORMAL HIGH (ref 70–99)
Glucose-Capillary: 179 mg/dL — ABNORMAL HIGH (ref 70–99)
Glucose-Capillary: 282 mg/dL — ABNORMAL HIGH (ref 70–99)
Glucose-Capillary: 359 mg/dL — ABNORMAL HIGH (ref 70–99)

## 2020-11-14 LAB — CBC
HCT: 42.4 % (ref 39.0–52.0)
Hemoglobin: 14.5 g/dL (ref 13.0–17.0)
MCH: 32.4 pg (ref 26.0–34.0)
MCHC: 34.2 g/dL (ref 30.0–36.0)
MCV: 94.6 fL (ref 80.0–100.0)
Platelets: 185 10*3/uL (ref 150–400)
RBC: 4.48 MIL/uL (ref 4.22–5.81)
RDW: 11.9 % (ref 11.5–15.5)
WBC: 13 10*3/uL — ABNORMAL HIGH (ref 4.0–10.5)
nRBC: 0.2 % (ref 0.0–0.2)

## 2020-11-14 LAB — PROTIME-INR
INR: 1.1 (ref 0.8–1.2)
Prothrombin Time: 13.3 seconds (ref 11.4–15.2)

## 2020-11-14 LAB — HEPARIN LEVEL (UNFRACTIONATED): Heparin Unfractionated: 0.34 IU/mL (ref 0.30–0.70)

## 2020-11-14 MED ORDER — SPIRONOLACTONE 12.5 MG HALF TABLET
12.5000 mg | ORAL_TABLET | Freq: Every day | ORAL | Status: DC
  Administered 2020-11-14 – 2020-11-15 (×2): 12.5 mg via ORAL
  Filled 2020-11-14 (×2): qty 1

## 2020-11-14 MED ORDER — APIXABAN 5 MG PO TABS: 5.0000 mg | ORAL_TABLET | Freq: Two times a day (BID) | ORAL | Status: DC

## 2020-11-14 MED ORDER — RIVAROXABAN 20 MG PO TABS
20.0000 mg | ORAL_TABLET | Freq: Every day | ORAL | Status: DC
  Administered 2020-11-14 – 2020-11-15 (×2): 20 mg via ORAL
  Filled 2020-11-14 (×2): qty 1

## 2020-11-14 MED ORDER — IPRATROPIUM-ALBUTEROL 0.5-2.5 (3) MG/3ML IN SOLN: 3.0000 mL | Freq: Three times a day (TID) | RESPIRATORY_TRACT | Status: DC

## 2020-11-14 MED ORDER — GUAIFENESIN ER 600 MG PO TB12
600.0000 mg | ORAL_TABLET | Freq: Two times a day (BID) | ORAL | Status: DC
  Administered 2020-11-14 – 2020-11-15 (×3): 600 mg via ORAL
  Filled 2020-11-14 (×3): qty 1

## 2020-11-14 MED ORDER — INSULIN ASPART 100 UNIT/ML ~~LOC~~ SOLN
10.0000 [IU] | Freq: Once | SUBCUTANEOUS | Status: AC
  Administered 2020-11-14: 10 [IU] via SUBCUTANEOUS

## 2020-11-14 MED ORDER — IPRATROPIUM-ALBUTEROL 0.5-2.5 (3) MG/3ML IN SOLN
3.0000 mL | Freq: Three times a day (TID) | RESPIRATORY_TRACT | Status: DC
  Administered 2020-11-14 – 2020-11-15 (×3): 3 mL via RESPIRATORY_TRACT
  Filled 2020-11-14 (×3): qty 3

## 2020-11-14 MED ORDER — LOSARTAN POTASSIUM 50 MG PO TABS
50.0000 mg | ORAL_TABLET | Freq: Every evening | ORAL | Status: DC
  Administered 2020-11-14: 50 mg via ORAL
  Filled 2020-11-14: qty 1

## 2020-11-14 NOTE — Progress Notes (Signed)
PROGRESS NOTE    Christopher Cross  FEO:[id number] DOB: Nov 09, 1966 DOA: 11/10/2020 PCP: Avon Gully, MD   Brief Narrative: 54 year old with past medical history significant for COPD, tobacco abuse, diabetes type 2, hypertension who presents with 3 days history of cough, shortness of breath and posttussive emesis.  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 He is currently unemployed and has been trying to stretch out his medication including his insulin. Evaluation in the ED he was found to be hyperglycemic CBG 450, mild transaminases, CT angio was negative for PE.  Mild elevation of troponin.  Echocardiogram showed new reduced ejection fraction, wall motion abnormality and a left apical  ventricular thrombus.   Assessment & Plan:   Principal Problem:   COPD exacerbation (HCC) Active Problems:   HTN (hypertension)   Uncontrolled type 2 diabetes mellitus with hypoglycemia (HCC)   Hypokalemia   Elevated troponin   Prolonged QT interval   Tobacco use   Transaminitis   Acute systolic heart failure (HCC)   1-New cardiomyopathy, ejection fraction 35%:Acute systolic Heart failure exacerbation.  Patient presented with chest discomfort for exertional, mild elevation of troponin, abnormal EKG.  Patient had T wave inversion diffusely. Plan for heart cath on 11/26. No apparent CAD.  Continue with metoprolol. Started on spironolactone, cozaar.  Received lasix 11/25 Cardiology following.  Dyspnea improved.   Left apical ventricular thrombus: Started on heparin drip. He will need assistance with medications.  Transition to  Eliquis.   COPD exacerbation: Change IV solumedrol to prednisone.  Schedule nebulizer every 4 hours.  Chest x ray with edema.. received one dose of lasix 11/05. Counseling about smoking. Dyspnea improved.   Alcohol abuse: Drinks 3 to 4 gallons per week previously CIWA Per protocol Counseling provided.    Uncontrolled diabetes type 2 with hyperglycemia Continue with 70/30. Increase to 35 units.  Diet change to carb modified.  Hopefully better controlled today now he is off IV solumedrol SSI.   Tabacco abuse: Continue with nicotine patch  Hypertension: now on metoprolol./ might need entresto  Hyponatremia:  Resolved. NSL fluids.   Hypokalemia: Resolved.   Transaminases Right upper quadrant ultrasound hepatic steatosis otherwise negative. Follow trend.  Hepatitis C and b negative.   Leukocytosis; related to steroids.   Estimated body mass index is 29.92 kg/m as calculated from the following:   Height as of this encounter: 5\' 9"  (1.753 m).   Weight as of this encounter: 91.9 kg.   DVT prophylaxis: heparin  Code Status: Full code Family Communication: care discussed with patient.  Disposition Plan:  Status is: Inpatient  Remains inpatient appropriate because:Hemodynamically unstable   Dispo: The patient is from: Home              Anticipated d/c is to: Home              Anticipated d/c date is: 1 day              Patient currently is not medically stable to d/c. home tomorrow if Respiratory status is stable.         Consultants:   Cardiology   Procedures:   Cath: LV end diastolic pressure is mildly elevated. LVEDP 22 mm Hg.  There is no aortic valve stenosis.  No angiographically apparent CAD.  Ao 98%, PA sat 77%, PA pressure 33/19, mean PA 26 mm Hg; PCWP 19 mm Hg; CO 6.7 L/min; CI 3.2  Hemodynamic findings consistent  with mild pulmonary hypertension.      Antimicrobials:    Subjective: Dyspnea improved. We talk about diet and smoking and alcohol cessation.   Objective: Vitals:   11/13/20 1935 11/13/20 2100 11/14/20 0612 11/14/20 0736  BP:  135/90 (!) 140/97   Pulse: 79 79 66 69  Resp: 17 17 11  (!) 24  Temp:  97.8 F (36.6 C) 97.9 F (36.6 C)   TempSrc:  Oral Oral   SpO2: 96% 92% 92% 98%  Weight:   91.9 kg   Height:         Intake/Output Summary (Last 24 hours) at 11/14/2020 0852 Last data filed at 11/14/2020 0610 Gross per 24 hour  Intake 396.83 ml  Output 1650 ml  Net -1253.17 ml   Filed Weights   11/12/20 0520 11/13/20 0609 11/14/20 0612  Weight: 91.6 kg 91.4 kg 91.9 kg    Examination:  General exam: NAD Respiratory system: less wheezing, mild tachypnea.  Cardiovascular system: S 1, S 2 RRR Gastrointestinal system: BS present, soft, nt Central nervous system: alert Extremities: Symmetric power   Data Reviewed: I have personally reviewed following labs and imaging studies  CBC: Recent Labs  Lab 11/10/20 2009 11/10/20 2009 11/11/20 0428 11/11/20 0428 11/11/20 0935 11/11/20 0935 11/12/20 0000 11/12/20 0000 11/13/20 11/15/20 11/13/20 0925 11/13/20 0932 11/13/20 0933 11/13/20 0942 11/13/20 0951 11/14/20 0343  WBC 7.9   < > 6.9  --  12.8*  --  13.0*  --  12.5*  --   --   --   --   --  13.0*  NEUTROABS 4.6  --   --   --   --   --   --   --   --   --   --   --   --   --   --   HGB 14.3   < > 15.3   < > 15.2   < > 13.6   < > 14.3   < > 14.3 14.3 13.6 13.9 14.5  HCT 41.1   < > 43.9   < > 43.8   < > 39.3   < > 41.4   < > 42.0 42.0 40.0 41.0 42.4  MCV 93.8   < > 93.8  --  92.6  --  93.6  --  94.7  --   --   --   --   --  94.6  PLT 167   < > 187  --  199  --  180  --  172  --   --   --   --   --  185   < > = values in this interval not displayed.   Basic Metabolic Panel: Recent Labs  Lab 11/10/20 2009 11/10/20 2306 11/11/20 0428 11/11/20 0428 11/11/20 0935 11/11/20 0935 11/11/20 1813 11/12/20 0000 11/12/20 0000 11/13/20 0212 11/13/20 0925 11/13/20 0932 11/13/20 0933 11/13/20 0942 11/13/20 0951 11/14/20 0343  NA   < >  --  132*   < > 134*   < >  --  137   < > 135   < > 138 138 137 138 134*  K   < >  --  3.9   < > 4.0   < >  --  4.0   < > 4.2   < > 3.9 3.9 3.8 3.7 4.1  CL   < >  --  100  --  103  --   --  104  --  102  --   --   --   --   --  99  CO2   < >  --  23  --  23   --   --  23  --  22  --   --   --   --   --  25  GLUCOSE   < >  --  426*   < > 322*  --  334* 362*  --  365*  --   --   --   --   --  320*  BUN   < >  --  11  --  12  --   --  16  --  18  --   --   --   --   --  10  CREATININE   < >  --  0.74  --  0.71  --   --  0.67  --  0.92  --   --   --   --   --  0.69  CALCIUM   < >  --  8.5*  --  8.5*  --   --  8.5*  --  8.9  --   --   --   --   --  8.7*  MG  --  2.0  --   --  2.4  --   --   --   --   --   --   --   --   --   --   --   PHOS  --  3.0  --   --  2.7  --   --   --   --   --   --   --   --   --   --   --    < > = values in this interval not displayed.   GFR: Estimated Creatinine Clearance: 121 mL/min (by C-G formula based on SCr of 0.69 mg/dL). Liver Function Tests: Recent Labs  Lab 11/10/20 2009 11/11/20 0428 11/11/20 0935 11/12/20 0000  AST 170* 94* 74* 58*  ALT 156* 148* 139* 125*  ALKPHOS 87 93 88 68  BILITOT 0.4 0.7 0.4 0.5  PROT 6.6 6.9 6.9 5.7*  ALBUMIN 3.4* 3.6 3.6 3.0*   No results for input(s): LIPASE, AMYLASE in the last 168 hours. No results for input(s): AMMONIA in the last 168 hours. Coagulation Profile: Recent Labs  Lab 11/14/20 0343  INR 1.1   Cardiac Enzymes: No results for input(s): CKTOTAL, CKMB, CKMBINDEX, TROPONINI in the last 168 hours. BNP (last 3 results) No results for input(s): PROBNP in the last 8760 hours. HbA1C: No results for input(s): HGBA1C in the last 72 hours. CBG: Recent Labs  Lab 11/13/20 1128 11/13/20 1629 11/13/20 1704 11/13/20 2101 11/14/20 0802  GLUCAP 218* 419* 377* 286* 289*   Lipid Profile: Recent Labs    11/12/20 0000  CHOL 152  HDL 55  LDLCALC 85  TRIG 62  CHOLHDL 2.8   Thyroid Function Tests: No results for input(s): TSH, T4TOTAL, FREET4, T3FREE, THYROIDAB in the last 72 hours. Anemia Panel: No results for input(s): VITAMINB12, FOLATE, FERRITIN, TIBC, IRON, RETICCTPCT in the last 72 hours. Sepsis Labs: No results for input(s): PROCALCITON, LATICACIDVEN in  the last 168 hours.  Recent Results (from the past 240 hour(s))  Resp Panel by RT-PCR (Flu A&B, Covid) Nasopharyngeal Swab     Status: None   Collection Time: 11/10/20  8:20 PM  Specimen: Nasopharyngeal Swab; Nasopharyngeal(NP) swabs in vial transport medium  Result Value Ref Range Status   SARS Coronavirus 2 by RT PCR NEGATIVE NEGATIVE Final    Comment: (NOTE) SARS-CoV-2 target nucleic acids are NOT DETECTED.  The SARS-CoV-2 RNA is generally detectable in upper respiratory specimens during the acute phase of infection. The lowest concentration of SARS-CoV-2 viral copies this assay can detect is 138 copies/mL. A negative result does not preclude SARS-Cov-2 infection and should not be used as the sole basis for treatment or other patient management decisions. A negative result may occur with  improper specimen collection/handling, submission of specimen other than nasopharyngeal swab, presence of viral mutation(s) within the areas targeted by this assay, and inadequate number of viral copies(<138 copies/mL). A negative result must be combined with clinical observations, patient history, and epidemiological information. The expected result is Negative.  Fact Sheet for Patients:  BloggerCourse.com  Fact Sheet for Healthcare Providers:  SeriousBroker.it  This test is no t yet approved or cleared by the [place] FDA and  has been authorized for detection and/or diagnosis of SARS-CoV-2 by FDA under an Emergency Use Authorization (EUA). This EUA will remain  in effect (meaning this test can be used) for the duration of the COVID-19 declaration under Section 564(b)(1) of the Act, 21 U.S.C.section 360bbb-3(b)(1), unless the authorization is terminated  or revoked sooner.       Influenza A by PCR NEGATIVE NEGATIVE Final   Influenza B by PCR NEGATIVE NEGATIVE Final    Comment: (NOTE) The Xpert Xpress SARS-CoV-2/FLU/RSV plus assay  is intended as an aid in the diagnosis of influenza from Nasopharyngeal swab specimens and should not be used as a sole basis for treatment. Nasal washings and aspirates are unacceptable for Xpert Xpress SARS-CoV-2/FLU/RSV testing.  Fact Sheet for Patients: BloggerCourse.com  Fact Sheet for Healthcare Providers: SeriousBroker.it  This test is not yet approved or cleared by the [place] FDA and has been authorized for detection and/or diagnosis of SARS-CoV-2 by FDA under an Emergency Use Authorization (EUA). This EUA will remain in effect (meaning this test can be used) for the duration of the COVID-19 declaration under Section 564(b)(1) of the Act, 21 U.S.C. section 360bbb-3(b)(1), unless the authorization is terminated or revoked.  Performed at [facility], [address], [place], [place] [address]          Radiology Studies: CARDIAC CATHETERIZATION  Result Date: 11/13/2020  LV end diastolic pressure is mildly elevated. LVEDP 22 mm Hg.  There is no aortic valve stenosis.  No angiographically apparent CAD.  Ao 98%, PA sat 77%, PA pressure 33/19, mean PA 26 mm Hg; PCWP 19 mm Hg; CO 6.7 L/min; CI 3.2  Hemodynamic findings consistent with mild pulmonary hypertension.  Nonischemic cardiomyopathy.  Medical therapy.  Continue anticoagulation for LV thrombus. Due to brachial vein blood being very red, we checked a saturation after the procedure from the brachial sheath.  O2 sats averaged 94.5% from the brachial sheath.  Venogram was done through the sheath which did not reveal any arterial flow toward the hand.  Sheath was certainly in a vein, but it seems likely there may be an AV fistula somewhere in the proximal forearm.         Scheduled Meds: . aspirin EC  81 mg Oral QPM  . budesonide (PULMICORT) nebulizer solution  0.5 mg Nebulization BID  . folic acid  1 mg Oral Daily  . insulin aspart  0-20 Units Subcutaneous TID  WC  . insulin  aspart protamine- aspart  35 Units Subcutaneous BID WC  . ipratropium-albuterol  3 mL Nebulization BID  . losartan  25 mg Oral QPM  . metoprolol succinate  25 mg Oral Daily  . multivitamin with minerals  1 tablet Oral Daily  . predniSONE  40 mg Oral Q breakfast  . sodium chloride flush  3 mL Intravenous Q12H  . sodium chloride flush  3 mL Intravenous Q12H  . spironolactone  12.5 mg Oral Daily  . thiamine  100 mg Oral Daily   Or  . thiamine  100 mg Intravenous Daily  . umeclidinium bromide  1 puff Inhalation Daily   Continuous Infusions: . sodium chloride    . heparin 1,500 Units/hr (11/13/20 1822)     LOS: 3 days    Time spent: 35 minutes.     Alba Cory, MD [facility]   If 7PM-7AM, please contact night-coverage [email or web address]  11/14/2020, 8:52 AM

## 2020-11-14 NOTE — Progress Notes (Signed)
Noted the diabetes coordinator consult for patient education. Diabetes coordinator spoke with patient at length on 11/24 about his diabetes history and the need to start taking a more affordable insulin. See progress notes on 11/24.   Recommend for discharge per note: Novolin Relion 70/30  Flexpen nsulin pens (order # [id number]) Insulin pen needles : 31 gauge x 5 mm (order # [id number]) CBG meter and supplies (order # [id number])    Will continue to monitor blood sugars while in the hospital.  Smith Mince RN BSN CDE Diabetes Coordinator Pager: [phone]  8am-5pm

## 2020-11-14 NOTE — Progress Notes (Addendum)
Progress Note  Patient Name: Christopher Cross Date of Encounter: 11/14/2020  [facility] Cardiologist: Dina Rich, MD   Subjective   Denies any CP or SOB.   Inpatient Medications    Scheduled Meds: . aspirin EC  81 mg Oral QPM  . budesonide (PULMICORT) nebulizer solution  0.5 mg Nebulization BID  . folic acid  1 mg Oral Daily  . insulin aspart  0-20 Units Subcutaneous TID WC  . insulin aspart protamine- aspart  35 Units Subcutaneous BID WC  . ipratropium-albuterol  3 mL Nebulization BID  . losartan  25 mg Oral QPM  . metoprolol succinate  25 mg Oral Daily  . multivitamin with minerals  1 tablet Oral Daily  . predniSONE  40 mg Oral Q breakfast  . sodium chloride flush  3 mL Intravenous Q12H  . sodium chloride flush  3 mL Intravenous Q12H  . thiamine  100 mg Oral Daily   Or  . thiamine  100 mg Intravenous Daily  . umeclidinium bromide  1 puff Inhalation Daily   Continuous Infusions: . sodium chloride    . heparin 1,500 Units/hr (11/13/20 1822)   PRN Meds: sodium chloride, acetaminophen, albuterol, LORazepam **OR** LORazepam, nicotine, nitroGLYCERIN, prochlorperazine, sodium chloride flush   Vital Signs    Vitals:   11/13/20 1935 11/13/20 2100 11/14/20 0612 11/14/20 0736  BP:  135/90 (!) 140/97   Pulse: 79 79 66 69  Resp: 17 17 11  (!) 24  Temp:  97.8 F (36.6 C) 97.9 F (36.6 C)   TempSrc:  Oral Oral   SpO2: 96% 92% 92% 98%  Weight:   91.9 kg   Height:        Intake/Output Summary (Last 24 hours) at 11/14/2020 0814 Last data filed at 11/14/2020 0610 Gross per 24 hour  Intake 396.83 ml  Output 1650 ml  Net -1253.17 ml   Last 3 Weights 11/14/2020 11/13/2020 11/12/2020  Weight (lbs) 202 lb 9.6 oz 201 lb 8 oz 201 lb 15.1 oz  Weight (kg) 91.9 kg 91.4 kg 91.6 kg      Telemetry    NSR without significant ventricular ectopy - Personally Reviewed  ECG    NSR with TWI in the inferolateral leads - Personally Reviewed  Physical Exam   GEN: No  acute distress.   Neck: No JVD Cardiac: RRR, no murmurs, rubs, or gallops.  Respiratory: Clear to auscultation bilaterally. GI: Soft, nontender, non-distended  MS: No edema; No deformity. Neuro:  Nonfocal  Psych: Normal affect   Labs    High Sensitivity Troponin:   Recent Labs  Lab 11/10/20 2009 11/10/20 2306  TROPONINIHS 62* 43*      Chemistry Recent Labs  Lab 11/11/20 0428 11/11/20 0428 11/11/20 0935 11/11/20 1813 11/12/20 0000 11/12/20 0000 11/13/20 0212 11/13/20 0925 11/13/20 0942 11/13/20 0951 11/14/20 0343  NA 132*   < > 134*  --  137   < > 135   < > 137 138 134*  K 3.9   < > 4.0  --  4.0   < > 4.2   < > 3.8 3.7 4.1  CL 100   < > 103  --  104  --  102  --   --   --  99  CO2 23   < > 23  --  23  --  22  --   --   --  25  GLUCOSE 426*   < > 322*   < > 362*  --  365*  --   --   --  320*  BUN 11   < > 12  --  16  --  18  --   --   --  10  CREATININE 0.74   < > 0.71  --  0.67  --  0.92  --   --   --  0.69  CALCIUM 8.5*   < > 8.5*  --  8.5*  --  8.9  --   --   --  8.7*  PROT 6.9  --  6.9  --  5.7*  --   --   --   --   --   --   ALBUMIN 3.6  --  3.6  --  3.0*  --   --   --   --   --   --   AST 94*  --  74*  --  58*  --   --   --   --   --   --   ALT 148*  --  139*  --  125*  --   --   --   --   --   --   ALKPHOS 93  --  88  --  68  --   --   --   --   --   --   BILITOT 0.7  --  0.4  --  0.5  --   --   --   --   --   --   GFRNONAA >60   < > >60  --  >60  --  >60  --   --   --  >60  ANIONGAP 9   < > 8  --  10  --  11  --   --   --  10   < > = values in this interval not displayed.     Hematology Recent Labs  Lab 11/12/20 0000 11/12/20 0000 11/13/20 0212 11/13/20 0925 11/13/20 0942 11/13/20 0951 11/14/20 0343  WBC 13.0*  --  12.5*  --   --   --  13.0*  RBC 4.20*  --  4.37  --   --   --  4.48  HGB 13.6   < > 14.3   < > 13.6 13.9 14.5  HCT 39.3   < > 41.4   < > 40.0 41.0 42.4  MCV 93.6  --  94.7  --   --   --  94.6  MCH 32.4  --  32.7  --   --   --  32.4    MCHC 34.6  --  34.5  --   --   --  34.2  RDW 11.9  --  11.9  --   --   --  11.9  PLT 180  --  172  --   --   --  185   < > = values in this interval not displayed.    BNP Recent Labs  Lab 11/10/20 2009  BNP 360.0*     DDimer  Recent Labs  Lab 11/10/20 2009  DDIMER 1.20*     Radiology    CARDIAC CATHETERIZATION  Result Date: 11/13/2020  LV end diastolic pressure is mildly elevated. LVEDP 22 mm Hg.  There is no aortic valve stenosis.  No angiographically apparent CAD.  Ao 98%, PA sat 77%, PA pressure 33/19, mean PA 26 mm Hg; PCWP 19 mm Hg; CO 6.7 L/min; CI 3.2  Hemodynamic findings consistent with mild pulmonary hypertension.  Nonischemic cardiomyopathy.  Medical therapy.  Continue anticoagulation for LV thrombus. Due to brachial vein blood being very red, we checked a saturation after the procedure from the brachial sheath.  O2 sats averaged 94.5% from the brachial sheath.  Venogram was done through the sheath which did not reveal any arterial flow toward the hand.  Sheath was certainly in a vein, but it seems likely there may be an AV fistula somewhere in the proximal forearm.    DG CHEST PORT 1 VIEW  Result Date: 11/12/2020 CLINICAL DATA:  Cough, fever and asthma. EXAM: PORTABLE CHEST 1 VIEW COMPARISON:  11/10/2020 FINDINGS: The heart size and mediastinal contours are within normal limits. There is mild increase interstitial markings noted peripherally suggesting mild edema. No superimposed airspace consolidation identified. The visualized skeletal structures are unremarkable. IMPRESSION: 1. Mild interstitial edema. 2. No airspace consolidation or pleural effusion. Electronically Signed   By: Signa Kell M.D.   On: 11/12/2020 08:55    Cardiac Studies   Cath 11/13/2020  LV end diastolic pressure is mildly elevated. LVEDP 22 mm Hg.  There is no aortic valve stenosis.  No angiographically apparent CAD.  Ao 98%, PA sat 77%, PA pressure 33/19, mean PA 26 mm Hg; PCWP  19 mm Hg; CO 6.7 L/min; CI 3.2  Hemodynamic findings consistent with mild pulmonary hypertension.   Nonischemic cardiomyopathy.  Medical therapy.  Continue anticoagulation for LV thrombus.  Due to brachial vein blood being very red, we checked a saturation after the procedure from the brachial sheath.  O2 sats averaged 94.5% from the brachial sheath.  Venogram was done through the sheath which did not reveal any arterial flow toward the hand.  Sheath was certainly in a vein, but it seems likely there may be an AV fistula somewhere in the proximal forearm.     Patient Profile     54 y.o. male with PMH of HTN, DM II, COPD, alcohol and tobacco abuse presented with worsening dyspnea and found to have elevated troponin, low EF by echo and LV thrombus. [facility].  Assessment & Plan    1. Acute systolic heart failure             - no prolonged chest pain recently, presented with dyspnea.             - Echocardiogram showed EF 30-35% with wall motion abnormality and apical thrombus. Unclear if stress induced, ischemic or EtOH cardiomyopathy.              - cardiac cath showed no CAD             - continue with toprol XL and losartan. Patient does not have any insurance and current job, unlikely to afford [employer].              - add spironolactone to medical regimen. May consider PRN lasix 20mg  as well. BMET in 5-7 days. Follow up in 2 weeks in our [place] office. Likely discharge tomorrow  2. LV thrombus             - on IV heparin waiting for cath, plan to initiate systemic anticoagulation therapy after cath  - discussed with the patient coumadin, pradaxa, Xarelto and Eliquis. Recommend start on Eliquis 5mg  BID. Will need 30 day free coupon upon discharge. Use this 30 days to apply for med assistance.   3. HTN  4.  DM II  5. Tobacco and alcohol abuse: reportedly had significant EtOH consumption. Discussed repeatedly the  importance of alcohol cessation.        For questions or updates, please contact [facility] Please consult [email or web address] for contact info under        Signed, Azalee Course, PA  11/14/2020, 8:14 AM    History and all data above reviewed.  Patient examined.  I agree with the findings as above.  No pain.  No SOB.   The patient exam reveals COR:RRR  ,  Lungs: Clear  ,  Abd: Positive bowel sounds, no rebound no guarding, Ext No edema, right radial without bruising or bleeding  .  All available labs, radiology testing, previous records reviewed. Agree with documented assessment and plan.   Non ischemic cardiomyopathy:  I will increase the Cozaar.  Continue med titration as an out patient.  He has been educated about stopping alcohol.  Likely cannot afford Entresto.  She seems to be euvolemic.   Apical thrombus:  I think he only needs Eliquis for 3 months.  I will start that and stop the heparin.     Christopher Cross  11:26 AM  11/14/2020

## 2020-11-14 NOTE — TOC Transition Note (Signed)
Transition of Care [facility]) - CM/SW Discharge Note   Patient Details  Name: Christopher Cross MRN: [id number] Date of Birth: 03-10-1966  Transition of Care [facility]) CM/SW Contact:  Lawerance Sabal, RN Phone Number: 11/14/2020, 4:38 PM   Clinical Narrative:   Provided with MATCH and Xaralto 30 day card. Discussed follow up w PCP, and cardiology for refills, discussed patient assistance a swell. Patient to call and schedule follow up apt.  [facility] [address] [place] for MATCH medications     Final next level of care: Home/Self Care Barriers to Discharge: No Barriers Identified   Patient Goals and CMS Choice        Discharge Placement                       Discharge Plan and Services                                     Social Determinants of Health (SDOH) Interventions     Readmission Risk Interventions No flowsheet data found.

## 2020-11-14 NOTE — Progress Notes (Signed)
ANTICOAGULATION CONSULT NOTE - Follow Up Consult  Pharmacy Consult for heparin Indication: apical thrombus  Labs: Recent Labs    11/12/20 0000 11/12/20 0000 11/13/20 0212 11/13/20 0925 11/13/20 0942 11/13/20 0942 11/13/20 0951 11/14/20 0343  HGB 13.6   < > 14.3   < > 13.6   < > 13.9 14.5  HCT 39.3   < > 41.4   < > 40.0  --  41.0 42.4  PLT 180  --  172  --   --   --   --  185  LABPROT  --   --   --   --   --   --   --  13.3  INR  --   --   --   --   --   --   --  1.1  HEPARINUNFRC 0.51  --  0.27*  --   --   --   --  0.34  CREATININE 0.67  --  0.92  --   --   --   --  0.69   < > = values in this interval not displayed.    Assessment/Plan:  54yo male therapeutic on heparin for apical thrombus. Will continue gtt at current rate and confirm stable with additional level.   Vernard Gambles, PharmD, BCPS  11/14/2020,6:28 AM

## 2020-11-14 NOTE — Plan of Care (Signed)
  Problem: Clinical Measurements: Goal: Ability to maintain clinical measurements within normal limits will improve Outcome: Progressing Goal: Will remain free from infection Outcome: Progressing   

## 2020-11-15 LAB — CBC
HCT: 42.9 % (ref 39.0–52.0)
Hemoglobin: 15 g/dL (ref 13.0–17.0)
MCH: 32.3 pg (ref 26.0–34.0)
MCHC: 35 g/dL (ref 30.0–36.0)
MCV: 92.3 fL (ref 80.0–100.0)
Platelets: 194 10*3/uL (ref 150–400)
RBC: 4.65 MIL/uL (ref 4.22–5.81)
RDW: 11.8 % (ref 11.5–15.5)
WBC: 12.2 10*3/uL — ABNORMAL HIGH (ref 4.0–10.5)
nRBC: 0.3 % — ABNORMAL HIGH (ref 0.0–0.2)

## 2020-11-15 LAB — GLUCOSE, CAPILLARY
Glucose-Capillary: 173 mg/dL — ABNORMAL HIGH (ref 70–99)
Glucose-Capillary: 196 mg/dL — ABNORMAL HIGH (ref 70–99)

## 2020-11-15 MED ORDER — ALBUTEROL SULFATE HFA 108 (90 BASE) MCG/ACT IN AERS
1.0000 | INHALATION_SPRAY | Freq: Four times a day (QID) | RESPIRATORY_TRACT | 3 refills | Status: DC | PRN
Start: 2020-11-15 — End: 2024-11-16

## 2020-11-15 MED ORDER — PREDNISONE 20 MG PO TABS: 40.0000 mg | ORAL_TABLET | Freq: Every day | ORAL | 0 refills | Status: AC

## 2020-11-15 MED ORDER — FOLIC ACID 1 MG PO TABS
1.0000 mg | ORAL_TABLET | Freq: Every day | ORAL | 3 refills | Status: DC
Start: 2020-11-15 — End: 2024-11-16

## 2020-11-15 MED ORDER — "PEN NEEDLES 3/16"" 31G X 5 MM MISC": 1.0000 "application " | Freq: Two times a day (BID) | 3 refills | Status: DC

## 2020-11-15 MED ORDER — FLUTICASONE-SALMETEROL 100-50 MCG/DOSE IN AEPB: 1.0000 | INHALATION_SPRAY | Freq: Two times a day (BID) | RESPIRATORY_TRACT | 2 refills | Status: DC

## 2020-11-15 MED ORDER — GUAIFENESIN ER 600 MG PO TB12
600.0000 mg | ORAL_TABLET | Freq: Two times a day (BID) | ORAL | 0 refills | Status: DC
Start: 2020-11-15 — End: 2024-11-16

## 2020-11-15 MED ORDER — NOVOLIN 70/30 FLEXPEN RELION (70-30) 100 UNIT/ML ~~LOC~~ SUPN: 40.0000 [IU] | PEN_INJECTOR | Freq: Two times a day (BID) | SUBCUTANEOUS | 11 refills | Status: DC

## 2020-11-15 MED ORDER — SPIRONOLACTONE 25 MG PO TABS
12.5000 mg | ORAL_TABLET | Freq: Every day | ORAL | 3 refills | Status: DC
Start: 2020-11-15 — End: 2022-01-21

## 2020-11-15 MED ORDER — METOPROLOL SUCCINATE ER 25 MG PO TB24
25.0000 mg | ORAL_TABLET | Freq: Every day | ORAL | 3 refills | Status: DC
Start: 2020-11-15 — End: 2020-11-23

## 2020-11-15 MED ORDER — INSULIN ASPART PROT & ASPART (70-30 MIX) 100 UNIT/ML ~~LOC~~ SUSP
40.0000 [IU] | Freq: Two times a day (BID) | SUBCUTANEOUS | Status: DC
  Administered 2020-11-15: 40 [IU] via SUBCUTANEOUS

## 2020-11-15 MED ORDER — ATORVASTATIN CALCIUM 40 MG PO TABS: 40.0000 mg | ORAL_TABLET | Freq: Every evening | ORAL | 3 refills | Status: DC

## 2020-11-15 MED ORDER — RIVAROXABAN 20 MG PO TABS
20.0000 mg | ORAL_TABLET | Freq: Every day | ORAL | 3 refills | Status: DC
Start: 2020-11-15 — End: 2020-12-31

## 2020-11-15 MED ORDER — BLOOD GLUCOSE METER KIT: PACK | 0 refills | Status: DC

## 2020-11-15 MED ORDER — SPIRIVA HANDIHALER 18 MCG IN CAPS
1.0000 | ORAL_CAPSULE | Freq: Every day | RESPIRATORY_TRACT | 12 refills | Status: DC
Start: 2020-11-15 — End: 2024-11-16

## 2020-11-15 MED ORDER — NICOTINE 21 MG/24HR TD PT24
21.0000 mg | MEDICATED_PATCH | Freq: Every day | TRANSDERMAL | 0 refills | Status: DC | PRN
Start: 2020-11-15 — End: 2024-11-16

## 2020-11-15 MED ORDER — LOSARTAN POTASSIUM 50 MG PO TABS
50.0000 mg | ORAL_TABLET | Freq: Every evening | ORAL | 3 refills | Status: DC
Start: 2020-11-15 — End: 2021-03-16

## 2020-11-15 MED ORDER — NITROGLYCERIN 0.4 MG SL SUBL: 0.4000 mg | SUBLINGUAL_TABLET | SUBLINGUAL | 3 refills | Status: DC | PRN

## 2020-11-15 NOTE — Progress Notes (Signed)
Progress Note  Patient Name: Christopher Cross Date of Encounter: 11/15/2020  [facility] Cardiologist: Dina Rich, MD   Subjective   No pain or SOB.  Ready to go home.   Inpatient Medications    Scheduled Meds: . aspirin EC  81 mg Oral QPM  . budesonide (PULMICORT) nebulizer solution  0.5 mg Nebulization BID  . folic acid  1 mg Oral Daily  . guaiFENesin  600 mg Oral BID  . insulin aspart  0-20 Units Subcutaneous TID WC  . insulin aspart protamine- aspart  40 Units Subcutaneous BID WC  . ipratropium-albuterol  3 mL Nebulization TID  . losartan  50 mg Oral QPM  . metoprolol succinate  25 mg Oral Daily  . multivitamin with minerals  1 tablet Oral Daily  . predniSONE  40 mg Oral Q breakfast  . rivaroxaban  20 mg Oral Q lunch  . sodium chloride flush  3 mL Intravenous Q12H  . sodium chloride flush  3 mL Intravenous Q12H  . spironolactone  12.5 mg Oral Daily  . thiamine  100 mg Oral Daily   Or  . thiamine  100 mg Intravenous Daily  . umeclidinium bromide  1 puff Inhalation Daily   Continuous Infusions: . sodium chloride     PRN Meds: sodium chloride, acetaminophen, albuterol, nicotine, nitroGLYCERIN, prochlorperazine, sodium chloride flush   Vital Signs    Vitals:   11/15/20 0633 11/15/20 0816 11/15/20 0820 11/15/20 0828  BP: (!) 137/96   117/78  Pulse: 62 61  70  Resp: 14 14    Temp: 98.4 F (36.9 C)     TempSrc: Oral     SpO2: 94% 94% 94%   Weight: 89.9 kg     Height:        Intake/Output Summary (Last 24 hours) at 11/15/2020 1147 Last data filed at 11/15/2020 0831 Gross per 24 hour  Intake 446.78 ml  Output 3725 ml  Net -3278.22 ml   Last 3 Weights 11/15/2020 11/14/2020 11/13/2020  Weight (lbs) 198 lb 1.6 oz 202 lb 9.6 oz 201 lb 8 oz  Weight (kg) 89.858 kg 91.9 kg 91.4 kg      Telemetry    NSR without significant ventricular ectopy - Personally Reviewed  ECG    NSR with TWI in the inferolateral leads - Personally Reviewed  Physical  Exam   GEN: No acute distress.   Neck: No JVD Cardiac: RRR, no murmurs, rubs, or gallops.  Respiratory: Clear to auscultation bilaterally. GI: Soft, nontender, non-distended  MS: No edema; No deformity. Neuro:  Nonfocal  Psych: Normal affect   Labs    High Sensitivity Troponin:   Recent Labs  Lab 11/10/20 2009 11/10/20 2306  TROPONINIHS 62* 43*      Chemistry Recent Labs  Lab 11/11/20 0428 11/11/20 0428 11/11/20 0935 11/11/20 1813 11/12/20 0000 11/12/20 0000 11/13/20 0212 11/13/20 0925 11/13/20 0942 11/13/20 0951 11/14/20 0343  NA 132*   < > 134*  --  137   < > 135   < > 137 138 134*  K 3.9   < > 4.0  --  4.0   < > 4.2   < > 3.8 3.7 4.1  CL 100   < > 103  --  104  --  102  --   --   --  99  CO2 23   < > 23  --  23  --  22  --   --   --  25  GLUCOSE 426*   < > 322*   < > 362*  --  365*  --   --   --  320*  BUN 11   < > 12  --  16  --  18  --   --   --  10  CREATININE 0.74   < > 0.71  --  0.67  --  0.92  --   --   --  0.69  CALCIUM 8.5*   < > 8.5*  --  8.5*  --  8.9  --   --   --  8.7*  PROT 6.9  --  6.9  --  5.7*  --   --   --   --   --   --   ALBUMIN 3.6  --  3.6  --  3.0*  --   --   --   --   --   --   AST 94*  --  74*  --  58*  --   --   --   --   --   --   ALT 148*  --  139*  --  125*  --   --   --   --   --   --   ALKPHOS 93  --  88  --  68  --   --   --   --   --   --   BILITOT 0.7  --  0.4  --  0.5  --   --   --   --   --   --   GFRNONAA >60   < > >60  --  >60  --  >60  --   --   --  >60  ANIONGAP 9   < > 8  --  10  --  11  --   --   --  10   < > = values in this interval not displayed.     Hematology Recent Labs  Lab 11/13/20 0212 11/13/20 0925 11/13/20 0951 11/14/20 0343 11/15/20 0217  WBC 12.5*  --   --  13.0* 12.2*  RBC 4.37  --   --  4.48 4.65  HGB 14.3   < > 13.9 14.5 15.0  HCT 41.4   < > 41.0 42.4 42.9  MCV 94.7  --   --  94.6 92.3  MCH 32.7  --   --  32.4 32.3  MCHC 34.5  --   --  34.2 35.0  RDW 11.9  --   --  11.9 11.8  PLT 172  --    --  185 194   < > = values in this interval not displayed.    BNP Recent Labs  Lab 11/10/20 2009  BNP 360.0*     DDimer  Recent Labs  Lab 11/10/20 2009  DDIMER 1.20*     Radiology    No results found.  Cardiac Studies   Cath 11/13/2020  LV end diastolic pressure is mildly elevated. LVEDP 22 mm Hg.  There is no aortic valve stenosis.  No angiographically apparent CAD.  Ao 98%, PA sat 77%, PA pressure 33/19, mean PA 26 mm Hg; PCWP 19 mm Hg; CO 6.7 L/min; CI 3.2  Hemodynamic findings consistent with mild pulmonary hypertension.   Nonischemic cardiomyopathy.  Medical therapy.  Continue anticoagulation for LV thrombus.  Due to brachial vein blood being very red, we checked a saturation after the  procedure from the brachial sheath.  O2 sats averaged 94.5% from the brachial sheath.  Venogram was done through the sheath which did not reveal any arterial flow toward the hand.  Sheath was certainly in a vein, but it seems likely there may be an AV fistula somewhere in the proximal forearm.     Patient Profile     54 y.o. male with PMH of HTN, DM II, COPD, alcohol and tobacco abuse presented with worsening dyspnea and found to have elevated troponin, low EF by echo and LV thrombus. [facility].  Assessment & Plan    Acute systolic heart failure:   Increased Cozaar yesterday.  Continue current therapy.    LV thrombus:  Started on Xarelto.  I would suggest 3 months.  (I chose Xarelto for compliance.)    HTN:  This is being managed in the context of treating his CHF  DM II:   Not well controlled as below.  Per primary team.  Lab Results  Component Value Date   HGBA1C 10.8 (H) 11/10/2020   Tobacco and alcohol abuse: Educated    For questions or updates, please contact [facility] Please consult [email or web address] for contact info under      Signed, Rollene Rotunda, MD  11/15/2020, 11:47 AM

## 2020-11-15 NOTE — Discharge Summary (Signed)
Physician Discharge Summary  Christopher Cross EQA:[id number] DOB: [birth year] DOA: 11/10/2020  PCP: Rosita Fire, MD  Admit date: 11/10/2020 Discharge date: 11/15/2020  Admitted From: Home  Disposition: Home   Recommendations for Outpatient Follow-up:  1. Follow up with PCP in 1-2 weeks 2. Please obtain BMP/CBC in one week 3. Needs to follow up with Cardiology for further care of cardiomyopathy 4. Needs further adjustment of BP and Diabetes medications  Home Health: none  Discharge Condition: Stable.  CODE STATUS: Full code Diet recommendation: Heart Healthy / Carb Modified   Brief/Interim Summary: 54 year old with past medical history significant for COPD, tobacco abuse, diabetes type 2, hypertension who presents with 3 days history of cough, shortness of breath and posttussive emesis.  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 He is currently unemployed and has been trying to stretch out his medication including his insulin. Evaluation in the ED he was found to be hyperglycemic CBG 450, mild transaminases, CT angio was negative for PE.  Mild elevation of troponin.  Echocardiogram showed new reduced ejection fraction, wall motion abnormality and a left apical  ventricular thrombus.   1-New cardiomyopathy, ejection fraction 97%:LGXQJ systolic Heart failure exacerbation.  Patient presented with chest discomfort for exertional, mild elevation of troponin, abnormal EKG.  Patient had T wave inversion diffusely. Plan for heart cath on 11/26. No apparent CAD.  Continue with metoprolol. Started on spironolactone, cozaar. tolerating meds. Plan to discharge home today.  Received lasix 11/25 Cardiology following.  Dyspnea improved.   Left apical ventricular thrombus: Started on heparin drip. He will need assistance with medications.  Transition to Xarelto. Needs 3 months treatment.   COPD exacerbation: Change IV solumedrol to  prednisone.  Schedule nebulizer every 4 hours.  Chest x ray with edema.. received one dose of lasix 11/05. Counseling about smoking. Lungs clear today, discharge on prednisone taper.   Alcohol abuse: Drinks 3 to 4 gallons per week previously CIWA Per protocol Counseling provided.   Uncontrolled diabetes type 2 with hyperglycemia Continue with 70/30. Increase to 40 units.  Diet change to carb modified.  Hopefully better controlled today now he is off IV solumedrol SSI.   Tabacco abuse: Continue with nicotine patch  Hypertension: now on metoprolol./ might need entresto  Hyponatremia:  Resolved. NSL fluids.   Hypokalemia: Resolved.   Transaminases Right upper quadrant ultrasound hepatic steatosis otherwise negative. Follow trend.  Hepatitis C and b negative.   Leukocytosis; related to steroids.    Discharge Diagnoses:  Principal Problem:   COPD exacerbation ([place]) Active Problems:   HTN (hypertension)   Uncontrolled type 2 diabetes mellitus with hypoglycemia ([facility])   Hypokalemia   Elevated troponin   Prolonged QT interval   Tobacco use   Transaminitis   Acute systolic heart failure ([place])    Discharge Instructions   Allergies as of 11/15/2020   No Known Allergies     Medication List    STOP taking these medications   amLODipine 10 MG tablet Commonly known as: NORVASC   atenolol 50 MG tablet Commonly known as: TENORMIN   Basaglar KwikPen 100 UNIT/ML   BC HEADACHE POWDER PO   ibuprofen 200 MG tablet Commonly known as: ADVIL   ondansetron 4 MG disintegrating tablet Commonly known as: Zofran ODT   oxyCODONE-acetaminophen 5-325 MG tablet Commonly known as: PERCOCET/ROXICET   sildenafil 100 MG tablet Commonly known as: Viagra     TAKE these medications   acetaminophen 500  MG tablet Commonly known as: TYLENOL Take 1,000 mg every 6 (six) hours as needed by mouth for headache.   albuterol 108 (90 Base) MCG/ACT inhaler Commonly known  as: ProAir HFA Inhale 1-2 puffs into the lungs every 6 (six) hours as needed for wheezing or shortness of breath. What changed: See the new instructions.   aspirin EC 81 MG tablet Take 1 tablet (81 mg total) by mouth daily. What changed: when to take this   atorvastatin 40 MG tablet Commonly known as: LIPITOR Take 1 tablet (40 mg total) by mouth every evening.   blood glucose meter kit and supplies Dispense based on patient and insurance preference. Use up to four times daily as directed. (FOR ICD-10 E10.9, E11.9).   calcium carbonate 500 MG chewable tablet Commonly known as: TUMS - dosed in mg elemental calcium Chew 2 tablets daily as needed by mouth for indigestion or heartburn.   Fluticasone-Salmeterol 100-50 MCG/DOSE Aepb Commonly known as: Advair Diskus Inhale 1 puff into the lungs in the morning and at bedtime.   folic acid 1 MG tablet Commonly known as: FOLVITE Take 1 tablet (1 mg total) by mouth daily.   guaiFENesin 600 MG 12 hr tablet Commonly known as: MUCINEX Take 1 tablet (600 mg total) by mouth 2 (two) times daily.   Januvia 100 MG tablet Generic drug: sitaGLIPtin Take 100 mg by mouth every evening.   losartan 50 MG tablet Commonly known as: COZAAR Take 1 tablet (50 mg total) by mouth every evening. What changed:   medication strength  how much to take   metFORMIN 1000 MG tablet Commonly known as: GLUCOPHAGE Take 1 tablet (1,000 mg total) 2 (two) times daily by mouth. What changed:   how much to take  when to take this   metoprolol succinate 25 MG 24 hr tablet Commonly known as: TOPROL-XL Take 1 tablet (25 mg total) by mouth daily.   multivitamin with minerals Tabs tablet Take 1 tablet by mouth every evening.   nicotine 21 mg/24hr patch Commonly known as: NICODERM CQ - dosed in mg/24 hours Place 1 patch (21 mg total) onto the skin daily as needed (For nicotine withdrawal symptoms.).   nitroGLYCERIN 0.4 MG SL tablet Commonly known as:  NITROSTAT Place 1 tablet (0.4 mg total) under the tongue every 5 (five) minutes as needed for chest pain.   NovoLIN 70/30 FlexPen Relion (70-30) 100 UNIT/ML KwikPen Generic drug: insulin isophane & regular human Inject 40 Units into the skin 2 (two) times daily before a meal.   Pen Needles 3/16" 31G X 5 MM Misc 1 application by Does not apply route 2 (two) times daily.   predniSONE 20 MG tablet Commonly known as: DELTASONE Take 2 tablets (40 mg total) by mouth daily with breakfast for 3 days.   rivaroxaban 20 MG Tabs tablet Commonly known as: XARELTO Take 1 tablet (20 mg total) by mouth daily with lunch.   Spiriva HandiHaler 18 MCG inhalation capsule Generic drug: tiotropium Place 1 capsule (18 mcg total) into inhaler and inhale daily. What changed: how much to take   spironolactone 25 MG tablet Commonly known as: ALDACTONE Take 0.5 tablets (12.5 mg total) by mouth daily.       No Known Allergies  Consultations:  Cardiology    Procedures/Studies: CT Angio Chest PE W and/or Wo Contrast  Result Date: 11/10/2020 CLINICAL DATA:  Cough and fever. EXAM: CT ANGIOGRAPHY CHEST WITH CONTRAST TECHNIQUE: Multidetector CT imaging of the chest was performed using the standard  protocol during bolus administration of intravenous contrast. Multiplanar CT image reconstructions and MIPs were obtained to evaluate the vascular anatomy. CONTRAST:  140m OMNIPAQUE IOHEXOL 350 MG/ML SOLN COMPARISON:  None. FINDINGS: Cardiovascular: Satisfactory opacification of the pulmonary arteries to the segmental level. No evidence of pulmonary embolism. Normal heart size. No pericardial effusion. Mediastinum/Nodes: No enlarged mediastinal, hilar, or axillary lymph nodes. Thyroid gland, trachea, and esophagus demonstrate no significant findings. Lungs/Pleura: Lungs are clear. No pleural effusion or pneumothorax. Upper Abdomen: No acute abnormality. Musculoskeletal: No chest wall abnormality. No acute or  significant osseous findings. Review of the MIP images confirms the above findings. IMPRESSION: Negative examination for pulmonary embolism or active cardiopulmonary disease. Electronically Signed   By: TVirgina NorfolkM.D.   On: 11/10/2020 22:46   CARDIAC CATHETERIZATION  Result Date: 103/54/6568 LV end diastolic pressure is mildly elevated. LVEDP 22 mm Hg.  There is no aortic valve stenosis.  No angiographically apparent CAD.  Ao 98%, PA sat 77%, PA pressure 33/19, mean PA 26 mm Hg; PCWP 19 mm Hg; CO 6.7 L/min; CI 3.2  Hemodynamic findings consistent with mild pulmonary hypertension.  Nonischemic cardiomyopathy.  Medical therapy.  Continue anticoagulation for LV thrombus. Due to brachial vein blood being very red, we checked a saturation after the procedure from the brachial sheath.  O2 sats averaged 94.5% from the brachial sheath.  Venogram was done through the sheath which did not reveal any arterial flow toward the hand.  Sheath was certainly in a vein, but it seems likely there may be an AV fistula somewhere in the proximal forearm.    DG CHEST PORT 1 VIEW  Result Date: 11/12/2020 CLINICAL DATA:  Cough, fever and asthma. EXAM: PORTABLE CHEST 1 VIEW COMPARISON:  11/10/2020 FINDINGS: The heart size and mediastinal contours are within normal limits. There is mild increase interstitial markings noted peripherally suggesting mild edema. No superimposed airspace consolidation identified. The visualized skeletal structures are unremarkable. IMPRESSION: 1. Mild interstitial edema. 2. No airspace consolidation or pleural effusion. Electronically Signed   By: TKerby MoorsM.D.   On: 11/12/2020 08:55   DG Chest Portable 1 View  Result Date: 11/10/2020 CLINICAL DATA:  Cough, fever.  Asthma. EXAM: PORTABLE CHEST 1 VIEW COMPARISON:  Chest x-ray 02/21/2014 FINDINGS: The heart size and mediastinal contours are within normal limits. No focal consolidation. Increased interstitial markings with no overt  pulmonary edema. No pleural effusion. No pneumothorax. Apical lungs collimated off view due to overlying soft tissues and osseous structures. No acute osseous abnormality. IMPRESSION: 1. No active cardiopulmonary disease. 2. Apical lungs collimated off view due to overlying soft tissues and osseous structures. Electronically Signed   By: MIven FinnM.D.   On: 11/10/2020 19:40   ECHOCARDIOGRAM COMPLETE  Result Date: 11/11/2020    ECHOCARDIOGRAM REPORT   Patient Name:   PTHERRON SELLSPAGE Date of Exam: 11/11/2020 Medical Rec #:  [id number]    Height:       69.0 in Accession #:    [id number]   Weight:       200.0 lb Date of Birth:  [birth year]    BSA:          2.066 m Patient Age:    89years      BP:           145/102 mmHg Patient Gender: M             HR:  84 bpm. Exam Location:  [facility] Procedure: Intracardiac Opacification Agent Indications:    Elevated Troponin  History:        Patient has prior history of Echocardiogram examinations, most                 recent 07/16/2014. COPD; Risk Factors:Current Smoker, Diabetes                 and Hypertension. Elevated Troponin.  Sonographer:    Leavy Cella RDCS (AE) Referring Phys: [id number] DAVID MANUEL [place]  1. Left ventricular ejection fraction, by estimation, is 30 to 35%. The left ventricle has moderately decreased function. The left ventricle demonstrates regional wall motion abnormalities (see scoring diagram/findings for description). Suggestive of possible stress induced cardiomyopathy although ischemic cardiomyopathy is not excluded. There is mild left ventricular hypertrophy. Left ventricular diastolic parameters are indeterminate. Definity contrast utilzed with evidence of apical LV thrombus, some of which is loosely organized.  2. Right ventricular systolic function is normal. The right ventricular size is normal. Tricuspid regurgitation signal is inadequate for assessing PA pressure.  3. The mitral valve is grossly normal.  Trivial mitral valve regurgitation.  4. The aortic valve is tricuspid. Aortic valve regurgitation is not visualized.  5. The inferior vena cava is normal in size with greater than 50% respiratory variability, suggesting right atrial pressure of 3 mmHg. FINDINGS  Left Ventricle: Left ventricular ejection fraction, by estimation, is 30 to 35%. The left ventricle has moderately decreased function. The left ventricle demonstrates regional wall motion abnormalities. Definity contrast agent was given IV to delineate the left ventricular endocardial borders. The left ventricular internal cavity size was normal in size. There is mild left ventricular hypertrophy. Left ventricular diastolic parameters are indeterminate.  LV Wall Scoring: The entire apex, mid anterolateral segment, and mid inferoseptal segment are akinetic. The mid anterior segment and mid inferior segment are hypokinetic. The basal anterolateral segment, basal anterior segment, basal inferior segment, and basal inferoseptal segment are normal. Right Ventricle: The right ventricular size is normal. No increase in right ventricular wall thickness. Right ventricular systolic function is normal. Tricuspid regurgitation signal is inadequate for assessing PA pressure. Left Atrium: Left atrial size was normal in size. Right Atrium: Right atrial size was normal in size. Pericardium: There is no evidence of pericardial effusion. Presence of pericardial fat pad. Mitral Valve: The mitral valve is grossly normal. Trivial mitral valve regurgitation. Tricuspid Valve: The tricuspid valve is grossly normal. Tricuspid valve regurgitation is trivial. Aortic Valve: The aortic valve is tricuspid. There is mild aortic valve annular calcification. Aortic valve regurgitation is not visualized. Pulmonic Valve: The pulmonic valve was grossly normal. Pulmonic valve regurgitation is trivial. Aorta: The aortic root is normal in size and structure. Venous: The inferior vena cava is  normal in size with greater than 50% respiratory variability, suggesting right atrial pressure of 3 mmHg. IAS/Shunts: No atrial level shunt detected by color flow Doppler.  LEFT VENTRICLE PLAX 2D LVIDd:         5.48 cm  Diastology LVIDs:         3.89 cm  LV e' medial:    6.09 cm/s LV PW:         1.13 cm  LV E/e' medial:  11.7 LV IVS:        0.86 cm  LV e' lateral:   5.87 cm/s LVOT diam:     2.00 cm  LV E/e' lateral: 12.1 LVOT Area:     3.14 cm  RIGHT VENTRICLE RV S prime:     15.20 cm/s TAPSE (M-mode): 1.8 cm LEFT ATRIUM             Index       RIGHT ATRIUM           Index LA diam:        3.60 cm 1.74 cm/m  RA Area:     11.20 cm LA Vol (A2C):   56.5 ml 27.35 ml/m RA Volume:   26.30 ml  12.73 ml/m LA Vol (A4C):   55.7 ml 26.96 ml/m LA Biplane Vol: 55.8 ml 27.01 ml/m   AORTA Ao Root diam: 2.70 cm MITRAL VALVE MV Area (PHT): 3.97 cm    SHUNTS MV Decel Time: 191 msec    Systemic Diam: 2.00 cm MV E velocity: 71.10 cm/s MV A velocity: 73.30 cm/s MV E/A ratio:  0.97 Rozann Lesches MD Electronically signed by Rozann Lesches MD Signature Date/Time: 11/11/2020/11:01:21 AM    Final    US Abdomen Limited RUQ (LIVER/GB)  Result Date: 11/11/2020 CLINICAL DATA:  Transaminasemia EXAM: ULTRASOUND ABDOMEN LIMITED RIGHT UPPER QUADRANT COMPARISON:  07/22/2020 CT renal stone. FINDINGS: Gallbladder: No gallstones or wall thickening visualized. No sonographic Murphy sign noted by sonographer. Common bile duct: Diameter: 5.0 mm Liver: No focal lesion identified. Increased parenchymal echogenicity. Portal vein is patent on color Doppler imaging with normal direction of blood flow towards the liver. Other: None. IMPRESSION: Hepatic steatosis.  No focal hepatic lesion. Electronically Signed   By: Primitivo Gauze M.D.   On: 11/11/2020 09:41      Subjective: Breathing better.   Discharge Exam: Vitals:   11/15/20 0816 11/15/20 0820  BP:    Pulse: 61   Resp: 14   Temp:    SpO2: 94% 94%     General: Pt is  alert, awake, not in acute distress Cardiovascular: RRR, S1/S2 +, no rubs, no gallops Respiratory: CTA bilaterally, no wheezing, no rhonchi Abdominal: Soft, NT, ND, bowel sounds + Extremities: no edema, no cyanosis    The results of significant diagnostics from this hospitalization (including imaging, microbiology, ancillary and laboratory) are listed below for reference.     Microbiology: Recent Results (from the past 240 hour(s))  Resp Panel by RT-PCR (Flu A&B, Covid) Nasopharyngeal Swab     Status: None   Collection Time: 11/10/20  8:20 PM   Specimen: Nasopharyngeal Swab; Nasopharyngeal(NP) swabs in vial transport medium  Result Value Ref Range Status   SARS Coronavirus 2 by RT PCR NEGATIVE NEGATIVE Final    Comment: (NOTE) SARS-CoV-2 target nucleic acids are NOT DETECTED.  The SARS-CoV-2 RNA is generally detectable in upper respiratory specimens during the acute phase of infection. The lowest concentration of SARS-CoV-2 viral copies this assay can detect is 138 copies/mL. A negative result does not preclude SARS-Cov-2 infection and should not be used as the sole basis for treatment or other patient management decisions. A negative result may occur with  improper specimen collection/handling, submission of specimen other than nasopharyngeal swab, presence of viral mutation(s) within the areas targeted by this assay, and inadequate number of viral copies(<138 copies/mL). A negative result must be combined with clinical observations, patient history, and epidemiological information. The expected result is Negative.  Fact Sheet for Patients:  EntrepreneurPulse.com.au  Fact Sheet for Healthcare Providers:  IncredibleEmployment.be  This test is no t yet approved or cleared by the [place] FDA and  has been authorized for detection and/or diagnosis of SARS-CoV-2 by FDA under an Emergency  Use Authorization (EUA). This EUA will remain  in  effect (meaning this test can be used) for the duration of the COVID-19 declaration under Section 564(b)(1) of the Act, 21 U.S.C.section 360bbb-3(b)(1), unless the authorization is terminated  or revoked sooner.       Influenza A by PCR NEGATIVE NEGATIVE Final   Influenza B by PCR NEGATIVE NEGATIVE Final    Comment: (NOTE) The Xpert Xpress SARS-CoV-2/FLU/RSV plus assay is intended as an aid in the diagnosis of influenza from Nasopharyngeal swab specimens and should not be used as a sole basis for treatment. Nasal washings and aspirates are unacceptable for Xpert Xpress SARS-CoV-2/FLU/RSV testing.  Fact Sheet for Patients: EntrepreneurPulse.com.au  Fact Sheet for Healthcare Providers: IncredibleEmployment.be  This test is not yet approved or cleared by the [place] FDA and has been authorized for detection and/or diagnosis of SARS-CoV-2 by FDA under an Emergency Use Authorization (EUA). This EUA will remain in effect (meaning this test can be used) for the duration of the COVID-19 declaration under Section 564(b)(1) of the Act, 21 U.S.C. section 360bbb-3(b)(1), unless the authorization is terminated or revoked.  Performed at [facility], [address] [place], [place] [address]      Labs: BNP (last 3 results) Recent Labs    11/10/20 2009  BNP 062.3*   Basic Metabolic Panel: Recent Labs  Lab 11/10/20 2009 11/10/20 2306 11/11/20 0428 11/11/20 0428 11/11/20 0935 11/11/20 0935 11/11/20 1813 11/12/20 0000 11/12/20 0000 11/13/20 0212 11/13/20 0925 11/13/20 0932 11/13/20 0933 11/13/20 0942 11/13/20 0951 11/14/20 0343  NA   < >  --  132*   < > 134*   < >  --  137   < > 135   < > 138 138 137 138 134*  K   < >  --  3.9   < > 4.0   < >  --  4.0   < > 4.2   < > 3.9 3.9 3.8 3.7 4.1  CL   < >  --  100  --  103  --   --  104  --  102  --   --   --   --   --  99  CO2   < >  --  23  --  23  --   --  23  --  22  --   --   --   --    --  25  GLUCOSE   < >  --  426*   < > 322*  --  334* 362*  --  365*  --   --   --   --   --  320*  BUN   < >  --  11  --  12  --   --  16  --  18  --   --   --   --   --  10  CREATININE   < >  --  0.74  --  0.71  --   --  0.67  --  0.92  --   --   --   --   --  0.69  CALCIUM   < >  --  8.5*  --  8.5*  --   --  8.5*  --  8.9  --   --   --   --   --  8.7*  MG  --  2.0  --   --  2.4  --   --   --   --   --   --   --   --   --   --   --  PHOS  --  3.0  --   --  2.7  --   --   --   --   --   --   --   --   --   --   --    < > = values in this interval not displayed.   Liver Function Tests: Recent Labs  Lab 11/10/20 2009 11/11/20 0428 11/11/20 0935 11/12/20 0000  AST 170* 94* 74* 58*  ALT 156* 148* 139* 125*  ALKPHOS 87 93 88 68  BILITOT 0.4 0.7 0.4 0.5  PROT 6.6 6.9 6.9 5.7*  ALBUMIN 3.4* 3.6 3.6 3.0*   No results for input(s): LIPASE, AMYLASE in the last 168 hours. No results for input(s): AMMONIA in the last 168 hours. CBC: Recent Labs  Lab 11/10/20 2009 11/11/20 0428 11/11/20 0935 11/11/20 0935 11/12/20 0000 11/12/20 0000 11/13/20 0212 11/13/20 0925 11/13/20 0933 11/13/20 0942 11/13/20 0951 11/14/20 0343 11/15/20 0217  WBC 7.9   < > 12.8*  --  13.0*  --  12.5*  --   --   --   --  13.0* 12.2*  NEUTROABS 4.6  --   --   --   --   --   --   --   --   --   --   --   --   HGB 14.3   < > 15.2   < > 13.6   < > 14.3   < > 14.3 13.6 13.9 14.5 15.0  HCT 41.1   < > 43.8   < > 39.3   < > 41.4   < > 42.0 40.0 41.0 42.4 42.9  MCV 93.8   < > 92.6  --  93.6  --  94.7  --   --   --   --  94.6 92.3  PLT 167   < > 199  --  180  --  172  --   --   --   --  185 194   < > = values in this interval not displayed.   Cardiac Enzymes: No results for input(s): CKTOTAL, CKMB, CKMBINDEX, TROPONINI in the last 168 hours. BNP: Invalid input(s): POCBNP CBG: Recent Labs  Lab 11/14/20 0802 11/14/20 1215 11/14/20 1708 11/14/20 2122 11/15/20 0746  GLUCAP 289* 179* 282* 359* 173*    D-Dimer No results for input(s): DDIMER in the last 72 hours. Hgb A1c No results for input(s): HGBA1C in the last 72 hours. Lipid Profile No results for input(s): CHOL, HDL, LDLCALC, TRIG, CHOLHDL, LDLDIRECT in the last 72 hours. Thyroid function studies No results for input(s): TSH, T4TOTAL, T3FREE, THYROIDAB in the last 72 hours.  Invalid input(s): FREET3 Anemia work up No results for input(s): VITAMINB12, FOLATE, FERRITIN, TIBC, IRON, RETICCTPCT in the last 72 hours. Urinalysis    Component Value Date/Time   COLORURINE STRAW (A) 11/11/2020 0400   APPEARANCEUR CLEAR 11/11/2020 0400   LABSPEC 1.038 (H) 11/11/2020 0400   PHURINE 7.0 11/11/2020 0400   GLUCOSEU >=500 (A) 11/11/2020 0400   HGBUR SMALL (A) 11/11/2020 0400   BILIRUBINUR NEGATIVE 11/11/2020 0400   KETONESUR 5 (A) 11/11/2020 0400   PROTEINUR NEGATIVE 11/11/2020 0400   NITRITE NEGATIVE 11/11/2020 0400   LEUKOCYTESUR NEGATIVE 11/11/2020 0400   Sepsis Labs Invalid input(s): PROCALCITONIN,  WBC,  LACTICIDVEN Microbiology Recent Results (from the past 240 hour(s))  Resp Panel by RT-PCR (Flu A&B, Covid) Nasopharyngeal Swab     Status: None   Collection Time: 11/10/20  8:20 PM   Specimen: Nasopharyngeal Swab; Nasopharyngeal(NP) swabs in vial transport medium  Result Value Ref Range Status   SARS Coronavirus 2 by RT PCR NEGATIVE NEGATIVE Final    Comment: (NOTE) SARS-CoV-2 target nucleic acids are NOT DETECTED.  The SARS-CoV-2 RNA is generally detectable in upper respiratory specimens during the acute phase of infection. The lowest concentration of SARS-CoV-2 viral copies this assay can detect is 138 copies/mL. A negative result does not preclude SARS-Cov-2 infection and should not be used as the sole basis for treatment or other patient management decisions. A negative result may occur with  improper specimen collection/handling, submission of specimen other than nasopharyngeal swab, presence of viral mutation(s)  within the areas targeted by this assay, and inadequate number of viral copies(<138 copies/mL). A negative result must be combined with clinical observations, patient history, and epidemiological information. The expected result is Negative.  Fact Sheet for Patients:  EntrepreneurPulse.com.au  Fact Sheet for Healthcare Providers:  IncredibleEmployment.be  This test is no t yet approved or cleared by the [place] FDA and  has been authorized for detection and/or diagnosis of SARS-CoV-2 by FDA under an Emergency Use Authorization (EUA). This EUA will remain  in effect (meaning this test can be used) for the duration of the COVID-19 declaration under Section 564(b)(1) of the Act, 21 U.S.C.section 360bbb-3(b)(1), unless the authorization is terminated  or revoked sooner.       Influenza A by PCR NEGATIVE NEGATIVE Final   Influenza B by PCR NEGATIVE NEGATIVE Final    Comment: (NOTE) The Xpert Xpress SARS-CoV-2/FLU/RSV plus assay is intended as an aid in the diagnosis of influenza from Nasopharyngeal swab specimens and should not be used as a sole basis for treatment. Nasal washings and aspirates are unacceptable for Xpert Xpress SARS-CoV-2/FLU/RSV testing.  Fact Sheet for Patients: EntrepreneurPulse.com.au  Fact Sheet for Healthcare Providers: IncredibleEmployment.be  This test is not yet approved or cleared by the [place] FDA and has been authorized for detection and/or diagnosis of SARS-CoV-2 by FDA under an Emergency Use Authorization (EUA). This EUA will remain in effect (meaning this test can be used) for the duration of the COVID-19 declaration under Section 564(b)(1) of the Act, 21 U.S.C. section 360bbb-3(b)(1), unless the authorization is terminated or revoked.  Performed at [facility], [address] [place], [place] [address]      Time coordinating discharge: 40  minutes  SIGNED:   Elmarie Shiley, MD  [facility]

## 2020-11-15 NOTE — Plan of Care (Signed)

## 2020-11-15 NOTE — Plan of Care (Signed)
  Problem: Activity: Goal: Ability to tolerate increased activity will improve Outcome: Progressing   Problem: Respiratory: Goal: Ability to maintain a clear airway will improve Outcome: Progressing   

## 2020-11-15 NOTE — Discharge Instructions (Signed)
Information on my medicine - XARELTO (Rivaroxaban)  This medication education was reviewed with me or my healthcare representative as part of my discharge preparation.    Why was Xarelto prescribed for you? Xarelto was prescribed for you to treat a clot in your heart, which can lead to a stroke if not treated.  What do you need to know about xarelto ? Take your Xarelto ONCE DAILY at the same time every day with the largest meal of your day (such as your evening meal). If you have difficulty swallowing the tablet whole, you may crush it and mix in applesauce just prior to taking your dose.  Take Xarelto exactly as prescribed by your doctor and DO NOT stop taking Xarelto without talking to the doctor who prescribed the medication.  Stopping without other stroke prevention medication to take the place of Xarelto may increase your risk of a stroke.  Refill your prescription before you run out.  After discharge, you should have regular check-up appointments with your healthcare provider that is prescribing your Xarelto.  In the future your dose may need to be changed if your kidney function or weight changes by a significant amount.  What do you do if you miss a dose? If you are taking Xarelto ONCE DAILY and you miss a dose, take it as soon as you remember on the same day then continue your regularly scheduled once daily regimen the next day. Do not take two doses of Xarelto at the same time or on the same day.   Important Safety Information A possible side effect of Xarelto is bleeding. You should call your healthcare provider right away if you experience any of the following: ? Bleeding from an injury or your nose that does not stop. ? Unusual colored urine (red or dark brown) or unusual colored stools (red or black). ? Unusual bruising for unknown reasons. ? A serious fall or if you hit your head (even if there is no bleeding).  Some medicines may interact with Xarelto and might  increase your risk of bleeding while on Xarelto. To help avoid this, consult your healthcare provider or pharmacist prior to using any new prescription or non-prescription medications, including herbals, vitamins, non-steroidal anti-inflammatory drugs (NSAIDs) and supplements.  This website has more information on Xarelto: VisitDestination.com.br.   Warning Signs of a Stroke   A stroke is a medical emergency and should be treated right away--every second counts. A stroke is caused by a decrease or block in blood flow to the brain. When this occurs, certain areas of the brain do not get enough oxygen, and brain cells begin to die. A stroke can lead to brain damage and can sometimes be life-threatening. However, if someone having a stroke gets medical treatment right away, he or she has better chances of surviving and recovering from the stroke. Being able to recognize the symptoms of a stroke is very important.  Warning signs of a stroke The symptoms of stroke may vary and will reflect the part of the brain that is involved. Symptoms usually happen suddenly. "BE FAST" is an easy way to remember the main warning signs of a stroke. B - Balance Signs are dizziness, sudden trouble walking, or loss of balance. E - Eyes Signs are trouble seeing or a sudden change in vision. F - Face Signs are sudden weakness or numbness of the face, or the face or eyelid drooping on one side. A - Arms Signs are weakness or numbness in an arm. This  happens suddenly and usually on one side of the body. S - Speech Signs are sudden trouble speaking, slurred speech, or trouble understanding what people say. T - Time Time to call emergency services. Write down what time symptoms started.  These symptoms may represent a serious problem that is an emergency. Do not wait to see if the symptoms will go away. Get medical help right away. Call your local emergency services (911 in the [place] Do not drive yourself to the  hospital.  Summary  A stroke is a medical emergency and should be treated right away--every second counts.  "BE FAST" is an easy way to remember the main warning signs of a stroke.  Call local emergency services right away if you or someone else has any stroke symptoms, even if the symptoms go away.  Make note of what time the first symptoms appeared. Emergency responders or emergency room staff will need to know this information.  Do not wait to see if symptoms will go away. Call 911 even if only one of the "BE FAST" symptoms appears.   This information is not intended to replace advice given to you by your health care provider. Make sure you discuss any questions you have with your health care provider. Document Revised: 11/17/2017 Document Reviewed: 03/24/2017 Elsevier Patient Education  2020 [employer].

## 2020-11-16 ENCOUNTER — Encounter (HOSPITAL_COMMUNITY): Payer: Self-pay | Admitting: Interventional Cardiology

## 2020-11-22 NOTE — Progress Notes (Signed)
Cardiology Office Note  Date: 11/23/2020   ID: Christopher Cross, DOB March 29, 1966, MRN [id number]  PCP:  Rosita Fire, MD  Cardiologist:  Carlyle Dolly, MD Electrophysiologist:  None   Chief Complaint: Hospital follow-up  History of Present Illness: Christopher Cross is a 54 y.o. male with a history of hypertension, chest pain, DM2, emphysema/COPD, tobacco abuse, LE edema.  Last encounter with Dr. Harl Bowie on 03/01/2016.  He was compliant with his antihypertension medication.  He did not check his blood pressure regularly at home.  He was taking chlorthalidone 12.5 mg daily and tolerating well.  History of chest pain both last stress test April 2015 with no ischemia and low risk Duke score of 7.  Denied any recent symptoms.  His lower extremity edema had resolved since starting chlorthalidone.  Patient presented 11/10/2020 with 3-day history significant for cough, shortness of breath and post tussive emesis.  In the emergency department he was found to be hyperglycemic blood glucose of 452.  Mild elevated transaminases, CT angio negative for PE.  Mild elevation of troponin.  Echocardiogram showed new reduced ejection fraction of 30 to 35%, wall motion abnormalities and left apical ventricular thrombus.  He was started on heparin drip and transition to Xarelto.  He was hypertensive.  He was taking metoprolol and note stated may need Entresto.  He was hyponatremic which resolved with normal saline fluids.  Hypokalemia resolved.  Right upper quadrant ultrasound showed hepatic steatosis negative otherwise.  He had a subsequent cardiac catheterization which showed no evidence of coronary artery disease.  BMP and CBC in 1 week.  Discharge on 11/10/2020.  Follow-up with cardiology for further care of cardiomyopathy.   Patient states she is doing well since recent hospital stay.  He denies any anginal or exertional symptoms, palpitations or arrhythmias, states the right radial access site is still a little  sore but otherwise he denies any issues.  He has gained some weight but states he has been eating significantly since he was discharged from the hospital.  His weight has increased from 198 pounds on 11 2821 to 216 pounds on today's weight.  Has no lower extremity edema.  States she is not drink any alcohol since discharge from the hospital.  States he has a patch on to help him stop smoking.  He states he is taking his Xarelto as ordered.    Past Medical History:  Diagnosis Date  . Asthma   . Diabetes mellitus without complication ([place])   . Other emphysema ([place])   . Unspecified essential hypertension     Past Surgical History:  Procedure Laterality Date  . RIGHT/LEFT HEART CATH AND CORONARY ANGIOGRAPHY N/A 11/13/2020   Procedure: RIGHT/LEFT HEART CATH AND CORONARY ANGIOGRAPHY;  Surgeon: Jettie Booze, MD;  Location: [facility];  Service: Cardiovascular;  Laterality: N/A;    Current Outpatient Medications  Medication Sig Dispense Refill  . acetaminophen (TYLENOL) 500 MG tablet Take 1,000 mg every 6 (six) hours as needed by mouth for headache.    . albuterol (PROAIR HFA) 108 (90 Base) MCG/ACT inhaler Inhale 1-2 puffs into the lungs every 6 (six) hours as needed for wheezing or shortness of breath. 1 each 3  . aspirin EC 81 MG tablet Take 1 tablet (81 mg total) by mouth daily. (Patient taking differently: Take 81 mg by mouth every evening. )    . atorvastatin (LIPITOR) 40 MG tablet Take 1 tablet (40 mg total) by mouth every evening. 30 tablet 3  .  blood glucose meter kit and supplies Dispense based on patient and insurance preference. Use up to four times daily as directed. (FOR ICD-10 E10.9, E11.9). 1 each 0  . calcium carbonate (TUMS - DOSED IN MG ELEMENTAL CALCIUM) 500 MG chewable tablet Chew 2 tablets daily as needed by mouth for indigestion or heartburn.    . Fluticasone-Salmeterol (ADVAIR DISKUS) 100-50 MCG/DOSE AEPB Inhale 1 puff into the lungs in the morning and at  bedtime. 1 each 2  . folic acid (FOLVITE) 1 MG tablet Take 1 tablet (1 mg total) by mouth daily. 30 tablet 3  . guaiFENesin (MUCINEX) 600 MG 12 hr tablet Take 1 tablet (600 mg total) by mouth 2 (two) times daily. 30 tablet 0  . insulin isophane & regular human (NOVOLIN 70/30 FLEXPEN RELION) (70-30) 100 UNIT/ML KwikPen Inject 40 Units into the skin 2 (two) times daily before a meal. 15 mL 11  . Insulin Pen Needle (PEN NEEDLES 3/16") 31G X 5 MM MISC 1 application by Does not apply route 2 (two) times daily. 30 each 3  . losartan (COZAAR) 50 MG tablet Take 1 tablet (50 mg total) by mouth every evening. 30 tablet 3  . metFORMIN (GLUCOPHAGE) 1000 MG tablet Take 1 tablet (1,000 mg total) 2 (two) times daily by mouth. (Patient taking differently: Take 2,000 mg by mouth every evening. ) 30 tablet 0  . metoprolol succinate (TOPROL-XL) 25 MG 24 hr tablet Take 1 tablet (25 mg total) by mouth daily. 30 tablet 3  . Multiple Vitamin (MULTIVITAMIN WITH MINERALS) TABS tablet Take 1 tablet by mouth every evening.     . nicotine (NICODERM CQ - DOSED IN MG/24 HOURS) 21 mg/24hr patch Place 1 patch (21 mg total) onto the skin daily as needed (For nicotine withdrawal symptoms.). 28 patch 0  . nitroGLYCERIN (NITROSTAT) 0.4 MG SL tablet Place 1 tablet (0.4 mg total) under the tongue every 5 (five) minutes as needed for chest pain. 30 tablet 3  . rivaroxaban (XARELTO) 20 MG TABS tablet Take 1 tablet (20 mg total) by mouth daily with lunch. 30 tablet 3  . sitaGLIPtin (JANUVIA) 100 MG tablet Take 100 mg by mouth every evening.     Marland Kitchen SPIRIVA HANDIHALER 18 MCG inhalation capsule Place 1 capsule (18 mcg total) into inhaler and inhale daily. 30 capsule 12  . spironolactone (ALDACTONE) 25 MG tablet Take 0.5 tablets (12.5 mg total) by mouth daily. 30 tablet 3   No current facility-administered medications for this visit.   Allergies:  Patient has no known allergies.   Social History: The patient  reports that he has been smoking  cigarettes. He started smoking about 4 years ago. He has been smoking about 2.00 packs per day. He has never used smokeless tobacco. He reports current alcohol use. He reports that he does not use drugs.   Family History: The patient's family history includes Heart attack in some other family members.   ROS:  Please see the history of present illness. Otherwise, complete review of systems is positive for none.  All other systems are reviewed and negative.   Physical Exam: VS:  BP (!) 158/88   Pulse 69   Ht _0  (1.753 m)   Wt 216 lb (98 kg)   SpO2 95%   BMI 31.90 kg/m , BMI Body mass index is 31.9 kg/m.  Wt Readings from Last 3 Encounters:  11/23/20 216 lb (98 kg)  11/15/20 198 lb 1.6 oz (89.9 kg)  07/21/20 200 lb (90.7  kg)    General: Patient appears comfortable at rest. Neck: Supple, no elevated JVP or carotid bruits, no thyromegaly. Lungs: Clear to auscultation, nonlabored breathing at rest. Cardiac: Regular rate and rhythm, no S3 or significant systolic murmur, no pericardial rub. Extremities: No pitting edema, distal pulses 2+. Skin: Warm and dry.  Right radial access site clean and dry without bruising redness or swelling.  2+ pulses Musculoskeletal: No kyphosis. Neuropsychiatric: Alert and oriented x3, affect grossly appropriate.  ECG:  EKG on November 12, 2020 normal sinus rhythm rate of 93.  ST and marked T wave abnormality consider anterolateral ischemia, prolonged QT.  Recent Labwork: 11/10/2020: B Natriuretic Peptide 360.0 11/11/2020: Magnesium 2.4 11/12/2020: ALT 125; AST 58 11/14/2020: BUN 10; Creatinine, Ser 0.69; Potassium 4.1; Sodium 134 11/15/2020: Hemoglobin 15.0; Platelets 194     Component Value Date/Time   CHOL 152 11/12/2020 0000   TRIG 62 11/12/2020 0000   HDL 55 11/12/2020 0000   CHOLHDL 2.8 11/12/2020 0000   VLDL 12 11/12/2020 0000   Tees Toh 85 11/12/2020 0000    Other Studies Reviewed Today:  Right and left heart catheterization  11/13/2020    LV end diastolic pressure is mildly elevated. LVEDP 22 mm Hg.  There is no aortic valve stenosis.  No angiographically apparent CAD.  Ao 98%, PA sat 77%, PA pressure 33/19, mean PA 26 mm Hg; PCWP 19 mm Hg; CO 6.7 L/min; CI 3.2  Hemodynamic findings consistent with mild pulmonary hypertension.   Nonischemic cardiomyopathy.  Medical therapy.  Continue anticoagulation for LV thrombus.  Due to brachial vein blood being very red, we checked a saturation after the procedure from the brachial sheath.  O2 sats averaged 94.5% from the brachial sheath.  Venogram was done through the sheath which did not reveal any arterial flow toward the hand.  Sheath was certainly in a vein, but it seems likely there may be an AV fistula somewhere in the proximal forearm.     Diagnostic Dominance: Right   Echocardiogram 11/11/2020 1. Left ventricular ejection fraction, by estimation, is 30 to 35%. The left ventricle has moderately decreased function. The left ventricle demonstrates regional wall motion abnormalities (see scoring diagram/findings for description). Suggestive of possible stress induced cardiomyopathy although ischemic cardiomyopathy is not excluded. There is mild left ventricular hypertrophy. Left ventricular diastolic parameters are indeterminate. Definity contrast utilzed with evidence of apical LV thrombus, some of which is loosely organized. 2. Right ventricular systolic function is normal. The right ventricular size is normal. Tricuspid regurgitation signal is inadequate for assessing PA pressure. 3. The mitral valve is grossly normal. Trivial mitral valve regurgitation. 4. The aortic valve is tricuspid. Aortic valve regurgitation is not visualized. 5. The inferior vena cava is normal in size with greater than 50% respiratory variability, suggesting right atrial pressure of 3 mmHg.    06/2014 Echo Study Conclusions  - Left ventricle: The cavity size was at the  upper limits of normal. Wall thickness was normal. Systolic function was normal. The estimated ejection fraction was in the range of 50% to 55%. Wall motion was normal; there were no regional wall motion abnormalities. Doppler parameters are consistent with abnormal left ventricular relaxation (grade 1 diastolic dysfunction). - Aortic valve: Mildly calcified annulus. Trileaflet. There was no significant regurgitation. - Mitral valve: There was trivial regurgitation. - Right atrium: Central venous pressure (est): 3 mm Hg. - Atrial septum: No defect or patent foramen ovale was identified. - Tricuspid valve: There was physiologic regurgitation. - Pulmonary arteries: Systolic pressure could  not be accurately estimated. - Pericardium, extracardiac: There was no pericardial effusion.  Impressions:  - Upper normal LV chamber size with LVEF 37-94%, grade 1 diastolic dysfunction. No major valvular abnormalities. Unable to assess PASP.  07/2014 MPI  IMPRESSION:  1. Negative exercise MPI for ischemia  2. Normal LV systolic function, LVEF 32%  3. Duke score of 7, consistent with low risk for major cardiac  events.  4. Very good exercise function capacity (120% of predicted based on  age and gender).     Assessment and Plan:  1. Acute systolic heart failure ([place])   2. Left ventricular thrombus   3. Essential hypertension   4. Tobacco abuse    1. Acute systolic heart failure ([place]) Recent hospitalization 11/10/2020 with complaints of cough, shortness of breath, posttussive emesis.  Cardiac catheterization performed due to low EF 30 to 35% suggestive of possible stress-induced cardiomyopathy and EKG changes.  Cardiac catheterization showed normal coronaries.  Continue losartan 50 mg daily.  Increase Toprol-XL to 50 mg daily.  Continue spironolactone 12.5 mg daily.  Patient states he stopped alcohol since discharge from hospital.  States he is using a nicotine patch.  Will get a  follow-up echocardiogram in 3 to 4 months to reassess LV function.  2. Left ventricular thrombus Recent echocardiogram showed decreased EF of 30 to 35% with LV thrombus.  He was started on Xarelto 20 mg daily.   Denies any bleeding issues.   3. Essential hypertension Blood pressure elevated on arrival today at 158/88.  Increase Toprol-XL to 50 mg daily.  Continue losartan 50 mg daily.  4. Tobacco abuse Patient is now wearing a nicotine patch and was advised cessation.   5.  Alcohol abuse Patient admits she has been drinking heavily since the death of his wife last year.  Advised him in this could be a contributor to cardiomyopathy.  Patient states he has not drank any alcohol since being discharged from the hospital.  Highly advised cessation due to recent diagnosis of cardiomyopathy which could be related to alcohol use.  Patient verbalizes understanding.    Medication Adjustments/Labs and Tests Ordered: Current medicines are reviewed at length with the patient today.  Concerns regarding medicines are outlined above.   Disposition: Follow-up with Dr. Harl Bowie or APP 3 months  Signed, Levell July, NP 11/23/2020 11:58 AM    [facility] [place], [place], [place] [address] Phone: [phone]; Fax: [phone]

## 2020-11-23 ENCOUNTER — Encounter: Payer: Self-pay | Admitting: Family Medicine

## 2020-11-23 ENCOUNTER — Ambulatory Visit (INDEPENDENT_AMBULATORY_CARE_PROVIDER_SITE_OTHER): Payer: Self-pay | Admitting: Family Medicine

## 2020-11-23 VITALS — BP 158/88 | HR 69 | Ht 69.0 in | Wt 216.0 lb

## 2020-11-23 DIAGNOSIS — F1010 Alcohol abuse, uncomplicated: Secondary | ICD-10-CM

## 2020-11-23 DIAGNOSIS — Z720 Tobacco use: Secondary | ICD-10-CM

## 2020-11-23 DIAGNOSIS — I10 Essential (primary) hypertension: Secondary | ICD-10-CM

## 2020-11-23 DIAGNOSIS — I5021 Acute systolic (congestive) heart failure: Secondary | ICD-10-CM

## 2020-11-23 DIAGNOSIS — I513 Intracardiac thrombosis, not elsewhere classified: Secondary | ICD-10-CM

## 2020-11-23 MED ORDER — METOPROLOL SUCCINATE ER 50 MG PO TB24: 50.0000 mg | ORAL_TABLET | Freq: Every day | ORAL | 1 refills | Status: DC

## 2020-11-23 NOTE — Patient Instructions (Addendum)
Your physician recommends that you schedule a follow-up appointment in: 3 MONTHS WITH DR [facility]  Your physician has recommended you make the following change in your medication:   INCREASE TOPROL XL 50 MG DAILY   Your physician recommends that you return for lab work CBC/BMP  Thank you for choosing [facility]!!

## 2020-12-25 ENCOUNTER — Other Ambulatory Visit: Payer: Self-pay

## 2020-12-25 ENCOUNTER — Other Ambulatory Visit (HOSPITAL_COMMUNITY)
Admission: RE | Admit: 2020-12-25 | Discharge: 2020-12-25 | Disposition: A | Discharge status: Home / Self Care | Payer: Self-pay | Source: Ambulatory Visit | Attending: Family Medicine | Admitting: Family Medicine

## 2020-12-25 DIAGNOSIS — I5021 Acute systolic (congestive) heart failure: Secondary | ICD-10-CM | POA: Insufficient documentation

## 2020-12-25 LAB — CBC
HCT: 42.8 % (ref 39.0–52.0)
Hemoglobin: 14.8 g/dL (ref 13.0–17.0)
MCH: 31.7 pg (ref 26.0–34.0)
MCHC: 34.6 g/dL (ref 30.0–36.0)
MCV: 91.6 fL (ref 80.0–100.0)
Platelets: 233 10*3/uL (ref 150–400)
RBC: 4.67 MIL/uL (ref 4.22–5.81)
RDW: 11.3 % — ABNORMAL LOW (ref 11.5–15.5)
WBC: 8.9 10*3/uL (ref 4.0–10.5)
nRBC: 0 % (ref 0.0–0.2)

## 2020-12-25 LAB — BASIC METABOLIC PANEL
Anion gap: 8 (ref 5–15)
BUN: 11 mg/dL (ref 6–20)
CO2: 26 mmol/L (ref 22–32)
Calcium: 9.4 mg/dL (ref 8.9–10.3)
Chloride: 103 mmol/L (ref 98–111)
Creatinine, Ser: 0.79 mg/dL (ref 0.61–1.24)
GFR, Estimated: >60 mL/min (ref >60)
Glucose, Bld: 209 mg/dL — ABNORMAL HIGH (ref 70–99)
Potassium: 3.7 mmol/L (ref 3.5–5.1)
Sodium: 137 mmol/L (ref 135–145)

## 2020-12-31 ENCOUNTER — Telehealth: Payer: Self-pay | Admitting: *Deleted

## 2020-12-31 ENCOUNTER — Other Ambulatory Visit: Payer: Self-pay | Admitting: *Deleted

## 2020-12-31 MED ORDER — RIVAROXABAN 20 MG PO TABS
20.0000 mg | ORAL_TABLET | Freq: Every day | ORAL | 0 refills | Status: DC
Start: 2020-12-31 — End: 2020-12-31

## 2020-12-31 MED ORDER — RIVAROXABAN 20 MG PO TABS
20.0000 mg | ORAL_TABLET | Freq: Every day | ORAL | 2 refills | Status: DC
Start: 2020-12-31 — End: 2024-11-16

## 2020-12-31 NOTE — Telephone Encounter (Signed)
-----   Message from Netta Neat., NP sent at 12/25/2020  1:47 PM EST ----- Lab work looks good except for elevated glucose of 209.  Advised him to follow-up with PCP for his blood sugar.  Thank you

## 2020-12-31 NOTE — Telephone Encounter (Signed)
Patient informed. Copy sent to PCP °

## 2020-12-31 NOTE — Telephone Encounter (Signed)
Advised that xarelto samples are available for pick up along with [employer] that he is to fill out all patient sections and bring back to office. Reports no income in the last 3-4 months and currently lives with parents. Verbalized understanding.

## 2021-01-07 ENCOUNTER — Encounter: Payer: Self-pay | Admitting: *Deleted

## 2021-01-07 NOTE — Progress Notes (Signed)
Received faxed notification from [employer] that patient approved for assistance for xarelto through 01/07/2022.

## 2021-02-22 ENCOUNTER — Ambulatory Visit: Payer: Self-pay | Admitting: Family Medicine

## 2021-03-16 ENCOUNTER — Ambulatory Visit (INDEPENDENT_AMBULATORY_CARE_PROVIDER_SITE_OTHER): Payer: Self-pay | Admitting: Cardiology

## 2021-03-16 ENCOUNTER — Encounter: Payer: Self-pay | Admitting: Cardiology

## 2021-03-16 VITALS — BP 158/98 | HR 74 | Ht 69.0 in | Wt 212.2 lb

## 2021-03-16 DIAGNOSIS — I5022 Chronic systolic (congestive) heart failure: Secondary | ICD-10-CM

## 2021-03-16 DIAGNOSIS — I513 Intracardiac thrombosis, not elsewhere classified: Secondary | ICD-10-CM

## 2021-03-16 DIAGNOSIS — I10 Essential (primary) hypertension: Secondary | ICD-10-CM

## 2021-03-16 MED ORDER — LOSARTAN POTASSIUM 100 MG PO TABS: 100.0000 mg | ORAL_TABLET | Freq: Every day | ORAL | 6 refills | Status: DC

## 2021-03-16 NOTE — Progress Notes (Signed)
Clinical Summary Mr. Halt is a 55 y.o.male seen today for follow up of the following medical problems.   1. HTN  - compliant with meds   2. Chronic systolic HF - hospitalization 11/10/2020 found to have LVEF 30-35% - Cardiac catheterization showed normal coronaries. - thought to be a stress incuded CM vs EtOH incuded CM  - no recent SOB/DOE. Working to increase his regular exercise - no recent edema - compliant with meds  3. LV thrombus - noted by 10/2020 echo - was started on xarelto - no recent bleeding on xarelto  4. History of EtOH abuse  SH: considering diasbility.  Works as Administrator.  Self pay currently   Past Medical History:  Diagnosis Date  . Asthma   . Diabetes mellitus without complication ([place])   . Other emphysema ([place])   . Unspecified essential hypertension      No Known Allergies   Current Outpatient Medications  Medication Sig Dispense Refill  . acetaminophen (TYLENOL) 500 MG tablet Take 1,000 mg every 6 (six) hours as needed by mouth for headache.    . albuterol (PROAIR HFA) 108 (90 Base) MCG/ACT inhaler Inhale 1-2 puffs into the lungs every 6 (six) hours as needed for wheezing or shortness of breath. 1 each 3  . aspirin EC 81 MG tablet Take 1 tablet (81 mg total) by mouth daily. (Patient taking differently: Take 81 mg by mouth every evening. )    . atorvastatin (LIPITOR) 40 MG tablet Take 1 tablet (40 mg total) by mouth every evening. 30 tablet 3  . blood glucose meter kit and supplies Dispense based on patient and insurance preference. Use up to four times daily as directed. (FOR ICD-10 E10.9, E11.9). 1 each 0  . calcium carbonate (TUMS - DOSED IN MG ELEMENTAL CALCIUM) 500 MG chewable tablet Chew 2 tablets daily as needed by mouth for indigestion or heartburn.    . Fluticasone-Salmeterol (ADVAIR DISKUS) 100-50 MCG/DOSE AEPB Inhale 1 puff into the lungs in the morning and at bedtime. 1 each 2  . folic acid (FOLVITE) 1 MG tablet Take 1  tablet (1 mg total) by mouth daily. 30 tablet 3  . guaiFENesin (MUCINEX) 600 MG 12 hr tablet Take 1 tablet (600 mg total) by mouth 2 (two) times daily. 30 tablet 0  . insulin isophane & regular human (NOVOLIN 70/30 FLEXPEN RELION) (70-30) 100 UNIT/ML KwikPen Inject 40 Units into the skin 2 (two) times daily before a meal. 15 mL 11  . Insulin Pen Needle (PEN NEEDLES 3/16") 31G X 5 MM MISC 1 application by Does not apply route 2 (two) times daily. 30 each 3  . losartan (COZAAR) 50 MG tablet Take 1 tablet (50 mg total) by mouth every evening. 30 tablet 3  . metFORMIN (GLUCOPHAGE) 1000 MG tablet Take 1 tablet (1,000 mg total) 2 (two) times daily by mouth. (Patient taking differently: Take 2,000 mg by mouth every evening. ) 30 tablet 0  . metoprolol succinate (TOPROL-XL) 50 MG 24 hr tablet Take 1 tablet (50 mg total) by mouth daily. Take with or immediately following a meal. 90 tablet 1  . Multiple Vitamin (MULTIVITAMIN WITH MINERALS) TABS tablet Take 1 tablet by mouth every evening.     . nicotine (NICODERM CQ - DOSED IN MG/24 HOURS) 21 mg/24hr patch Place 1 patch (21 mg total) onto the skin daily as needed (For nicotine withdrawal symptoms.). 28 patch 0  . nitroGLYCERIN (NITROSTAT) 0.4 MG SL tablet Place 1 tablet (  0.4 mg total) under the tongue every 5 (five) minutes as needed for chest pain. 30 tablet 3  . rivaroxaban (XARELTO) 20 MG TABS tablet Take 1 tablet (20 mg total) by mouth daily with lunch. 90 tablet 2  . sitaGLIPtin (JANUVIA) 100 MG tablet Take 100 mg by mouth every evening.     Marland Kitchen SPIRIVA HANDIHALER 18 MCG inhalation capsule Place 1 capsule (18 mcg total) into inhaler and inhale daily. 30 capsule 12  . spironolactone (ALDACTONE) 25 MG tablet Take 0.5 tablets (12.5 mg total) by mouth daily. 30 tablet 3   No current facility-administered medications for this visit.     Past Surgical History:  Procedure Laterality Date  . RIGHT/LEFT HEART CATH AND CORONARY ANGIOGRAPHY N/A 11/13/2020    Procedure: RIGHT/LEFT HEART CATH AND CORONARY ANGIOGRAPHY;  Surgeon: Jettie Booze, MD;  Location: [facility];  Service: Cardiovascular;  Laterality: N/A;     No Known Allergies    Family History  Problem Relation Age of Onset  . Heart attack Other        Grandfather  . Heart attack Other        Uncle     Social History Mr. Kruer reports that he has been smoking cigarettes. He started smoking about 5 years ago. He has been smoking about 2.00 packs per day. He has never used smokeless tobacco. Mr. Churilla reports current alcohol use.   Review of Systems CONSTITUTIONAL: No weight loss, fever, chills, weakness or fatigue.  HEENT: Eyes: No visual loss, blurred vision, double vision or yellow sclerae.No hearing loss, sneezing, congestion, runny nose or sore throat.  SKIN: No rash or itching.  CARDIOVASCULAR: per hpi RESPIRATORY: No shortness of breath, cough or sputum.  GASTROINTESTINAL: No anorexia, nausea, vomiting or diarrhea. No abdominal pain or blood.  GENITOURINARY: No burning on urination, no polyuria NEUROLOGICAL: No headache, dizziness, syncope, paralysis, ataxia, numbness or tingling in the extremities. No change in bowel or bladder control.  MUSCULOSKELETAL: No muscle, back pain, joint pain or stiffness.  LYMPHATICS: No enlarged nodes. No history of splenectomy.  PSYCHIATRIC: No history of depression or anxiety.  ENDOCRINOLOGIC: No reports of sweating, cold or heat intolerance. No polyuria or polydipsia.  Marland Kitchen   Physical Examination Today's Vitals   03/16/21 0951  BP: (!) 158/98  Pulse: 74  SpO2: 98%  Weight: 212 lb 3.2 oz (96.3 kg)  Height: _0  (1.753 m)   Body mass index is 31.34 kg/m.  Gen: resting comfortably, no acute distress HEENT: no scleral icterus, pupils equal round and reactive, no palptable cervical adenopathy,  CV: RRR, no m/r/g, no jvd Resp: Clear to auscultation bilaterally GI: abdomen is soft, non-tender, non-distended, normal  bowel sounds, no hepatosplenomegaly MSK: extremities are warm, no edema.  Skin: warm, no rash Neuro:  no focal deficits Psych: appropriate affect   Diagnostic Studies  06/2014 Echo Study Conclusions  - Left ventricle: The cavity size was at the upper limits of normal. Wall thickness was normal. Systolic function was normal. The estimated ejection fraction was in the range of 50% to 55%. Wall motion was normal; there were no regional wall motion abnormalities. Doppler parameters are consistent with abnormal left ventricular relaxation (grade 1 diastolic dysfunction). - Aortic valve: Mildly calcified annulus. Trileaflet. There was no significant regurgitation. - Mitral valve: There was trivial regurgitation. - Right atrium: Central venous pressure (est): 3 mm Hg. - Atrial septum: No defect or patent foramen ovale was identified. - Tricuspid valve: There was physiologic regurgitation. -  Pulmonary arteries: Systolic pressure could not be accurately estimated. - Pericardium, extracardiac: There was no pericardial effusion.  Impressions:  - Upper normal LV chamber size with LVEF 13-24%, grade 1 diastolic dysfunction. No major valvular abnormalities. Unable to assess PASP.  07/2014 MPI  IMPRESSION:  1. Negative exercise MPI for ischemia  2. Normal LV systolic function, LVEF 40%  3. Duke score of 7, consistent with low risk for major cardiac  events.  4. Very good exercise function capacity (120% of predicted based on  age and gender).   10/2020 echo IMPRESSIONS    1. Left ventricular ejection fraction, by estimation, is 30 to 35%. The  left ventricle has moderately decreased function. The left ventricle  demonstrates regional wall motion abnormalities (see scoring  diagram/findings for description). Suggestive of  possible stress induced cardiomyopathy although ischemic cardiomyopathy is  not excluded. There is mild left ventricular hypertrophy. Left  ventricular  diastolic parameters are indeterminate. Definity contrast utilzed with  evidence of apical LV thrombus,  some of which is loosely organized.  2. Right ventricular systolic function is normal. The right ventricular  size is normal. Tricuspid regurgitation signal is inadequate for assessing  PA pressure.  3. The mitral valve is grossly normal. Trivial mitral valve  regurgitation.  4. The aortic valve is tricuspid. Aortic valve regurgitation is not  visualized.  5. The inferior vena cava is normal in size with greater than 50%  respiratory variability, suggesting right atrial pressure of 3 mmHg.    10/2020 cath  LV end diastolic pressure is mildly elevated. LVEDP 22 mm Hg.  There is no aortic valve stenosis.  No angiographically apparent CAD.  Ao 98%, PA sat 77%, PA pressure 33/19, mean PA 26 mm Hg; PCWP 19 mm Hg; CO 6.7 L/min; CI 3.2  Hemodynamic findings consistent with mild pulmonary hypertension.   Nonischemic cardiomyopathy.  Medical therapy.  Continue anticoagulation for LV thrombus.  Due to brachial vein blood being very red, we checked a saturation after the procedure from the brachial sheath.  O2 sats averaged 94.5% from the brachial sheath.  Venogram was done through the sheath which did not reveal any arterial flow toward the hand.  Sheath was certainly in a vein, but it seems likely there may be an AV fistula somewhere in the proximal forearm.      Assessment and Plan  1. HTN  - elevated today, increase losartan to 129m daily.   2. Chronic systolic HF - repeat echo pending [facility] asssistance application - pending results consider further titration of meds. - self pay, unclear if would be able to change to entresto.   3. LV thrombus - repeat echo pending [facility] assistance application - continue xarelto   He will apply to [facility] assistance prior to repeating echo      JArnoldo Lenis M.D.

## 2021-03-16 NOTE — Patient Instructions (Addendum)
Medication Instructions:   Your physician has recommended you make the following change in your medication:   Stop aspirin  Increase losartan to 100 mg by mouth daily  Continue other medications the same  Labwork:  none  Testing/Procedures:  None-Echo once [facility] assistance available  Follow-Up:  Your physician recommends that you schedule a follow-up appointment in: 2 months.   Any Other Special Instructions Will Be Listed Below (If Applicable).  If you need a refill on your cardiac medications before your next appointment, please call your pharmacy.

## 2021-05-17 NOTE — Progress Notes (Deleted)
Cardiology Office Note  Date: 05/17/2021   ID: Christopher Cross, DOB 09-21-1966, MRN [id number]  PCP:  Rosita Fire, MD  Cardiologist:  Carlyle Dolly, MD Electrophysiologist:  None   Chief Complaint: 2 month follow up  History of Present Illness: Christopher Cross is a 55 y.o. male with a history of Chronic systolic HF, DM2, HTN, LV thrombus, alcohol abuse.  He was last seen by Dr. Harl Bowie on 03/16/2021.  His blood pressure was elevated.  His losartan was increased to 100 mg daily.  Repeat echo was ordered for chronic systolic HF and LV thrombus pending patient assistance application.  Pending results would consider further titration of medications. Unclear if he would be able to change to [facility]. He was continuing Xarelto for LV thrombus.  Past Medical History:  Diagnosis Date  . Asthma   . Diabetes mellitus without complication ([place])   . Other emphysema ([place])   . Unspecified essential hypertension     Past Surgical History:  Procedure Laterality Date  . RIGHT/LEFT HEART CATH AND CORONARY ANGIOGRAPHY N/A 11/13/2020   Procedure: RIGHT/LEFT HEART CATH AND CORONARY ANGIOGRAPHY;  Surgeon: Jettie Booze, MD;  Location: [facility];  Service: Cardiovascular;  Laterality: N/A;    Current Outpatient Medications  Medication Sig Dispense Refill  . acetaminophen (TYLENOL) 500 MG tablet Take 1,000 mg every 6 (six) hours as needed by mouth for headache.    . albuterol (PROAIR HFA) 108 (90 Base) MCG/ACT inhaler Inhale 1-2 puffs into the lungs every 6 (six) hours as needed for wheezing or shortness of breath. 1 each 3  . atorvastatin (LIPITOR) 40 MG tablet Take 1 tablet (40 mg total) by mouth every evening. 30 tablet 3  . blood glucose meter kit and supplies Dispense based on patient and insurance preference. Use up to four times daily as directed. (FOR ICD-10 E10.9, E11.9). 1 each 0  . calcium carbonate (TUMS - DOSED IN MG ELEMENTAL CALCIUM) 500 MG chewable tablet Chew 2  tablets daily as needed by mouth for indigestion or heartburn.    . Fluticasone-Salmeterol (ADVAIR DISKUS) 100-50 MCG/DOSE AEPB Inhale 1 puff into the lungs in the morning and at bedtime. 1 each 2  . folic acid (FOLVITE) 1 MG tablet Take 1 tablet (1 mg total) by mouth daily. 30 tablet 3  . guaiFENesin (MUCINEX) 600 MG 12 hr tablet Take 1 tablet (600 mg total) by mouth 2 (two) times daily. 30 tablet 0  . insulin isophane & regular human (NOVOLIN 70/30 FLEXPEN RELION) (70-30) 100 UNIT/ML KwikPen Inject 40 Units into the skin 2 (two) times daily before a meal. 15 mL 11  . Insulin Pen Needle (PEN NEEDLES 3/16") 31G X 5 MM MISC 1 application by Does not apply route 2 (two) times daily. 30 each 3  . losartan (COZAAR) 100 MG tablet Take 1 tablet (100 mg total) by mouth daily. 30 tablet 6  . metFORMIN (GLUCOPHAGE) 1000 MG tablet Take 1 tablet (1,000 mg total) 2 (two) times daily by mouth. (Patient taking differently: Take 2,000 mg by mouth every evening.) 30 tablet 0  . metoprolol succinate (TOPROL-XL) 50 MG 24 hr tablet Take 50 mg by mouth daily. Take with or immediately following a meal.    . Multiple Vitamin (MULTIVITAMIN WITH MINERALS) TABS tablet Take 1 tablet by mouth every evening.     . nicotine (NICODERM CQ - DOSED IN MG/24 HOURS) 21 mg/24hr patch Place 1 patch (21 mg total) onto the skin daily as  needed (For nicotine withdrawal symptoms.). 28 patch 0  . nitroGLYCERIN (NITROSTAT) 0.4 MG SL tablet Place 1 tablet (0.4 mg total) under the tongue every 5 (five) minutes as needed for chest pain. 30 tablet 3  . rivaroxaban (XARELTO) 20 MG TABS tablet Take 1 tablet (20 mg total) by mouth daily with lunch. 90 tablet 2  . sitaGLIPtin (JANUVIA) 100 MG tablet Take 100 mg by mouth every evening.     Marland Kitchen SPIRIVA HANDIHALER 18 MCG inhalation capsule Place 1 capsule (18 mcg total) into inhaler and inhale daily. 30 capsule 12  . spironolactone (ALDACTONE) 25 MG tablet Take 0.5 tablets (12.5 mg total) by mouth daily.  30 tablet 3   No current facility-administered medications for this visit.   Allergies:  Patient has no known allergies.   Social History: The patient  reports that he has been smoking cigarettes. He started smoking about 5 years ago. He has been smoking about 2.00 packs per day. He has never used smokeless tobacco. He reports current alcohol use. He reports that he does not use drugs.   Family History: The patient's family history includes Heart attack in some other family members.   ROS:  Please see the history of present illness. Otherwise, complete review of systems is positive for {NONE DEFAULTED:18576::"none"}.  All other systems are reviewed and negative.   Physical Exam: VS:  There were no vitals taken for this visit., BMI There is no height or weight on file to calculate BMI.  Wt Readings from Last 3 Encounters:  03/16/21 212 lb 3.2 oz (96.3 kg)  11/23/20 216 lb (98 kg)  11/15/20 198 lb 1.6 oz (89.9 kg)    General: Patient appears comfortable at rest. HEENT: Conjunctiva and lids normal, oropharynx clear with moist mucosa. Neck: Supple, no elevated JVP or carotid bruits, no thyromegaly. Lungs: Clear to auscultation, nonlabored breathing at rest. Cardiac: Regular rate and rhythm, no S3 or significant systolic murmur, no pericardial rub. Abdomen: Soft, nontender, no hepatomegaly, bowel sounds present, no guarding or rebound. Extremities: No pitting edema, distal pulses 2+. Skin: Warm and dry. Musculoskeletal: No kyphosis. Neuropsychiatric: Alert and oriented x3, affect grossly appropriate.  ECG:  {EKG/Telemetry Strips Reviewed:[id number]}  Recent Labwork: 11/10/2020: B Natriuretic Peptide 360.0 11/11/2020: Magnesium 2.4 11/12/2020: ALT 125; AST 58 12/25/2020: BUN 11; Creatinine, Ser 0.79; Hemoglobin 14.8; Platelets 233; Potassium 3.7; Sodium 137     Component Value Date/Time   CHOL 152 11/12/2020 0000   TRIG 62 11/12/2020 0000   HDL 55 11/12/2020 0000   CHOLHDL 2.8  11/12/2020 0000   VLDL 12 11/12/2020 0000   Miranda 85 11/12/2020 0000    Other Studies Reviewed Today: 06/2014 Echo Study Conclusions  - Left ventricle: The cavity size was at the upper limits of normal. Wall thickness was normal. Systolic function was normal. The estimated ejection fraction was in the range of 50% to 55%. Wall motion was normal; there were no regional wall motion abnormalities. Doppler parameters are consistent with abnormal left ventricular relaxation (grade 1 diastolic dysfunction). - Aortic valve: Mildly calcified annulus. Trileaflet. There was no significant regurgitation. - Mitral valve: There was trivial regurgitation. - Right atrium: Central venous pressure (est): 3 mm Hg. - Atrial septum: No defect or patent foramen ovale was identified. - Tricuspid valve: There was physiologic regurgitation. - Pulmonary arteries: Systolic pressure could not be accurately estimated. - Pericardium, extracardiac: There was no pericardial effusion.  Impressions:  - Upper normal LV chamber size with LVEF 98-26%, grade 1 diastolic dysfunction.  No major valvular abnormalities. Unable to assess PASP.  07/2014 MPI  IMPRESSION:  1. Negative exercise MPI for ischemia  2. Normal LV systolic function, LVEF 53%  3. Duke score of 7, consistent with low risk for major cardiac  events.  4. Very good exercise function capacity (120% of predicted based on  age and gender).   10/2020 echo IMPRESSIONS    1. Left ventricular ejection fraction, by estimation, is 30 to 35%. The  left ventricle has moderately decreased function. The left ventricle  demonstrates regional wall motion abnormalities (see scoring  diagram/findings for description). Suggestive of  possible stress induced cardiomyopathy although ischemic cardiomyopathy is  not excluded. There is mild left ventricular hypertrophy. Left ventricular  diastolic parameters are indeterminate. Definity contrast  utilzed with  evidence of apical LV thrombus,  some of which is loosely organized.  2. Right ventricular systolic function is normal. The right ventricular  size is normal. Tricuspid regurgitation signal is inadequate for assessing  PA pressure.  3. The mitral valve is grossly normal. Trivial mitral valve  regurgitation.  4. The aortic valve is tricuspid. Aortic valve regurgitation is not  visualized.  5. The inferior vena cava is normal in size with greater than 50%  respiratory variability, suggesting right atrial pressure of 3 mmHg.    10/2020 cath  LV end diastolic pressure is mildly elevated. LVEDP 22 mm Hg.  There is no aortic valve stenosis.  No angiographically apparent CAD.  Ao 98%, PA sat 77%, PA pressure 33/19, mean PA 26 mm Hg; PCWP 19 mm Hg; CO 6.7 L/min; CI 3.2  Hemodynamic findings consistent with mild pulmonary hypertension.  Nonischemic cardiomyopathy. Medical therapy. Continue anticoagulation for LV thrombus.  Due to brachial vein blood being very red, we checked a saturation after the procedure from the brachial sheath. O2 sats averaged 94.5% from the brachial sheath. Venogram was done through the sheath which did not reveal any arterial flow toward the hand. Sheath was certainly in a vein, but it seems likely there may be an AV fistula somewhere in the proximal forearm.     Assessment and Plan:  1. Essential hypertension   2. Chronic systolic heart failure ([place])   3. Left ventricular thrombus    1. Essential hypertension ***  2. Chronic systolic heart failure (H[facility]) ***  3. Left ventricular thrombus ***  Medication Adjustments/Labs and Tests Ordered: Current medicines are reviewed at length with the patient today.  Concerns regarding medicines are outlined above.   Disposition: Follow-up with ***  Signed, Levell July, NP 05/17/2021 9:10 PM    [facility] [place], [place], [place]  [address] Phone: [phone]; Fax: [phone]

## 2021-05-18 ENCOUNTER — Ambulatory Visit: Payer: Self-pay | Admitting: Family Medicine

## 2021-05-18 DIAGNOSIS — I5022 Chronic systolic (congestive) heart failure: Secondary | ICD-10-CM

## 2021-05-18 DIAGNOSIS — I513 Intracardiac thrombosis, not elsewhere classified: Secondary | ICD-10-CM

## 2021-05-18 DIAGNOSIS — I10 Essential (primary) hypertension: Secondary | ICD-10-CM

## 2021-10-18 ENCOUNTER — Other Ambulatory Visit: Payer: Self-pay | Admitting: Family Medicine

## 2022-01-21 ENCOUNTER — Other Ambulatory Visit: Payer: Self-pay | Admitting: Cardiology

## 2022-01-21 ENCOUNTER — Other Ambulatory Visit: Payer: Self-pay | Admitting: *Deleted

## 2022-01-21 MED ORDER — SPIRONOLACTONE 25 MG PO TABS: 12.5000 mg | ORAL_TABLET | Freq: Every day | ORAL | 0 refills | Status: DC

## 2022-03-26 ENCOUNTER — Other Ambulatory Visit: Payer: Self-pay | Admitting: Cardiology

## 2022-04-16 ENCOUNTER — Other Ambulatory Visit: Payer: Self-pay | Admitting: Cardiology

## 2022-05-02 ENCOUNTER — Other Ambulatory Visit: Payer: Self-pay | Admitting: Cardiology

## 2022-05-19 ENCOUNTER — Other Ambulatory Visit: Payer: Self-pay | Admitting: Cardiology

## 2022-05-26 ENCOUNTER — Other Ambulatory Visit (HOSPITAL_COMMUNITY)
Admission: RE | Admit: 2022-05-26 | Discharge: 2022-05-26 | Disposition: A | Discharge status: Home / Self Care | Payer: BC Managed Care – PPO | Source: Ambulatory Visit | Attending: Internal Medicine | Admitting: Internal Medicine

## 2022-05-26 ENCOUNTER — Ambulatory Visit (HOSPITAL_COMMUNITY)
Admission: RE | Admit: 2022-05-26 | Discharge: 2022-05-26 | Disposition: A | Discharge status: Home / Self Care | Payer: BC Managed Care – PPO | Source: Ambulatory Visit | Attending: Gerontology | Admitting: Gerontology

## 2022-05-26 ENCOUNTER — Other Ambulatory Visit (HOSPITAL_COMMUNITY): Payer: Self-pay | Admitting: Gerontology

## 2022-05-26 DIAGNOSIS — R059 Cough, unspecified: Secondary | ICD-10-CM | POA: Diagnosis not present

## 2022-05-26 DIAGNOSIS — Z1159 Encounter for screening for other viral diseases: Secondary | ICD-10-CM | POA: Insufficient documentation

## 2022-05-26 DIAGNOSIS — I110 Hypertensive heart disease with heart failure: Secondary | ICD-10-CM | POA: Insufficient documentation

## 2022-05-26 DIAGNOSIS — Z0001 Encounter for general adult medical examination with abnormal findings: Secondary | ICD-10-CM | POA: Insufficient documentation

## 2022-05-26 DIAGNOSIS — I509 Heart failure, unspecified: Secondary | ICD-10-CM | POA: Insufficient documentation

## 2022-05-26 DIAGNOSIS — I5022 Chronic systolic (congestive) heart failure: Secondary | ICD-10-CM | POA: Diagnosis not present

## 2022-05-26 DIAGNOSIS — E1165 Type 2 diabetes mellitus with hyperglycemia: Secondary | ICD-10-CM | POA: Insufficient documentation

## 2022-05-26 DIAGNOSIS — I10 Essential (primary) hypertension: Secondary | ICD-10-CM | POA: Diagnosis not present

## 2022-05-26 DIAGNOSIS — F17210 Nicotine dependence, cigarettes, uncomplicated: Secondary | ICD-10-CM | POA: Diagnosis not present

## 2022-05-26 LAB — CBC WITH DIFFERENTIAL/PLATELET
Abs Immature Granulocytes: 0.02 10*3/uL (ref 0.00–0.07)
Basophils Absolute: 0.1 10*3/uL (ref 0.0–0.1)
Basophils Relative: 1 %
Eosinophils Absolute: 0.3 10*3/uL (ref 0.0–0.5)
Eosinophils Relative: 3 %
HCT: 46.5 % (ref 39.0–52.0)
Hemoglobin: 16.4 g/dL (ref 13.0–17.0)
Immature Granulocytes: 0 %
Lymphocytes Relative: 33 %
Lymphs Abs: 3.3 10*3/uL (ref 0.7–4.0)
MCH: 32.5 pg (ref 26.0–34.0)
MCHC: 35.3 g/dL (ref 30.0–36.0)
MCV: 92.3 fL (ref 80.0–100.0)
Monocytes Absolute: 0.7 10*3/uL (ref 0.1–1.0)
Monocytes Relative: 7 %
Neutro Abs: 5.6 10*3/uL (ref 1.7–7.7)
Neutrophils Relative %: 56 %
Platelets: 215 10*3/uL (ref 150–400)
RBC: 5.04 MIL/uL (ref 4.22–5.81)
RDW: 11.7 % (ref 11.5–15.5)
WBC: 10 10*3/uL (ref 4.0–10.5)
nRBC: 0 % (ref 0.0–0.2)

## 2022-05-26 LAB — BASIC METABOLIC PANEL
Anion gap: 9 (ref 5–15)
BUN: 14 mg/dL (ref 6–20)
CO2: 24 mmol/L (ref 22–32)
Calcium: 8.4 mg/dL — ABNORMAL LOW (ref 8.9–10.3)
Chloride: 100 mmol/L (ref 98–111)
Creatinine, Ser: 0.89 mg/dL (ref 0.61–1.24)
GFR, Estimated: >60 mL/min (ref >60)
Glucose, Bld: 134 mg/dL — ABNORMAL HIGH (ref 70–99)
Potassium: 3.3 mmol/L — ABNORMAL LOW (ref 3.5–5.1)
Sodium: 133 mmol/L — ABNORMAL LOW (ref 135–145)

## 2022-05-26 LAB — LIPID PANEL
Cholesterol: 235 mg/dL — ABNORMAL HIGH (ref 0–200)
HDL: 39 mg/dL — ABNORMAL LOW (ref >40)
LDL Cholesterol: UNDETERMINED mg/dL (ref 0–99)
Total CHOL/HDL Ratio: 6 RATIO
Triglycerides: 445 mg/dL — ABNORMAL HIGH (ref <150)
VLDL: UNDETERMINED mg/dL (ref 0–40)

## 2022-05-26 LAB — HEPATIC FUNCTION PANEL
ALT: 71 U/L — ABNORMAL HIGH (ref 0–44)
AST: 52 U/L — ABNORMAL HIGH (ref 15–41)
Albumin: 3.9 g/dL (ref 3.5–5.0)
Alkaline Phosphatase: 75 U/L (ref 38–126)
Bilirubin, Direct: <0.1 mg/dL (ref 0.0–0.2)
Total Bilirubin: 0.9 mg/dL (ref 0.3–1.2)
Total Protein: 7.2 g/dL (ref 6.5–8.1)

## 2022-05-26 LAB — LDL CHOLESTEROL, DIRECT: Direct LDL: 125 mg/dL — ABNORMAL HIGH (ref 0–99)

## 2022-05-26 LAB — HEMOGLOBIN A1C
Hgb A1c MFr Bld: 8.4 % — ABNORMAL HIGH (ref 4.8–5.6)
Mean Plasma Glucose: 194.38 mg/dL

## 2022-05-27 LAB — MICROALBUMIN / CREATININE URINE RATIO
Creatinine, Urine: 181 mg/dL
Microalb Creat Ratio: 64 mg/g creat — ABNORMAL HIGH (ref 0–29)
Microalb, Ur: 115 ug/mL — ABNORMAL HIGH

## 2022-05-27 LAB — HCV AB W REFLEX TO QUANT PCR: HCV Ab: NONREACTIVE

## 2022-05-27 LAB — HCV INTERPRETATION

## 2022-09-05 DIAGNOSIS — E1165 Type 2 diabetes mellitus with hyperglycemia: Secondary | ICD-10-CM | POA: Diagnosis not present

## 2022-09-05 DIAGNOSIS — I10 Essential (primary) hypertension: Secondary | ICD-10-CM | POA: Diagnosis not present

## 2022-09-05 DIAGNOSIS — J449 Chronic obstructive pulmonary disease, unspecified: Secondary | ICD-10-CM | POA: Diagnosis not present

## 2022-09-05 DIAGNOSIS — I5022 Chronic systolic (congestive) heart failure: Secondary | ICD-10-CM | POA: Diagnosis not present

## 2022-09-05 DIAGNOSIS — Z23 Encounter for immunization: Secondary | ICD-10-CM | POA: Diagnosis not present

## 2022-11-24 DIAGNOSIS — I10 Essential (primary) hypertension: Secondary | ICD-10-CM | POA: Diagnosis not present

## 2022-11-24 DIAGNOSIS — E1165 Type 2 diabetes mellitus with hyperglycemia: Secondary | ICD-10-CM | POA: Diagnosis not present

## 2022-11-24 DIAGNOSIS — J449 Chronic obstructive pulmonary disease, unspecified: Secondary | ICD-10-CM | POA: Diagnosis not present

## 2022-11-24 DIAGNOSIS — I5022 Chronic systolic (congestive) heart failure: Secondary | ICD-10-CM | POA: Diagnosis not present

## 2022-12-20 DIAGNOSIS — Z7984 Long term (current) use of oral hypoglycemic drugs: Secondary | ICD-10-CM | POA: Diagnosis not present

## 2022-12-20 DIAGNOSIS — H25811 Combined forms of age-related cataract, right eye: Secondary | ICD-10-CM | POA: Diagnosis not present

## 2022-12-20 DIAGNOSIS — E1136 Type 2 diabetes mellitus with diabetic cataract: Secondary | ICD-10-CM | POA: Diagnosis not present

## 2022-12-20 DIAGNOSIS — H2522 Age-related cataract, morgagnian type, left eye: Secondary | ICD-10-CM | POA: Diagnosis not present

## 2023-02-02 DIAGNOSIS — H268 Other specified cataract: Secondary | ICD-10-CM | POA: Diagnosis not present

## 2023-02-16 DIAGNOSIS — H269 Unspecified cataract: Secondary | ICD-10-CM | POA: Diagnosis not present

## 2023-02-16 DIAGNOSIS — H268 Other specified cataract: Secondary | ICD-10-CM | POA: Diagnosis not present

## 2023-02-16 DIAGNOSIS — I10 Essential (primary) hypertension: Secondary | ICD-10-CM | POA: Diagnosis not present

## 2023-02-16 DIAGNOSIS — H26121 Partially resolved traumatic cataract, right eye: Secondary | ICD-10-CM | POA: Diagnosis not present

## 2023-02-21 ENCOUNTER — Other Ambulatory Visit (HOSPITAL_COMMUNITY)
Admission: RE | Admit: 2023-02-21 | Discharge: 2023-02-21 | Disposition: A | Discharge status: Home / Self Care | Payer: BC Managed Care – PPO | Source: Ambulatory Visit | Attending: Internal Medicine | Admitting: Internal Medicine

## 2023-02-21 DIAGNOSIS — Z0001 Encounter for general adult medical examination with abnormal findings: Secondary | ICD-10-CM | POA: Insufficient documentation

## 2023-02-21 DIAGNOSIS — E559 Vitamin D deficiency, unspecified: Secondary | ICD-10-CM | POA: Diagnosis not present

## 2023-02-21 DIAGNOSIS — I5022 Chronic systolic (congestive) heart failure: Secondary | ICD-10-CM | POA: Diagnosis not present

## 2023-02-21 DIAGNOSIS — J449 Chronic obstructive pulmonary disease, unspecified: Secondary | ICD-10-CM | POA: Diagnosis not present

## 2023-02-21 DIAGNOSIS — E1165 Type 2 diabetes mellitus with hyperglycemia: Secondary | ICD-10-CM | POA: Insufficient documentation

## 2023-02-21 DIAGNOSIS — F17200 Nicotine dependence, unspecified, uncomplicated: Secondary | ICD-10-CM | POA: Diagnosis not present

## 2023-02-21 LAB — BASIC METABOLIC PANEL
Anion gap: 8 (ref 5–15)
BUN: 22 mg/dL — ABNORMAL HIGH (ref 6–20)
CO2: 25 mmol/L (ref 22–32)
Calcium: 10.1 mg/dL (ref 8.9–10.3)
Chloride: 99 mmol/L (ref 98–111)
Creatinine, Ser: 1.04 mg/dL (ref 0.61–1.24)
GFR, Estimated: >60 mL/min (ref >60)
Glucose, Bld: 387 mg/dL — ABNORMAL HIGH (ref 70–99)
Potassium: 4.4 mmol/L (ref 3.5–5.1)
Sodium: 132 mmol/L — ABNORMAL LOW (ref 135–145)

## 2023-02-21 LAB — CBC WITH DIFFERENTIAL/PLATELET
Abs Immature Granulocytes: 0.04 10*3/uL (ref 0.00–0.07)
Basophils Absolute: 0.1 10*3/uL (ref 0.0–0.1)
Basophils Relative: 1 %
Eosinophils Absolute: 0.5 10*3/uL (ref 0.0–0.5)
Eosinophils Relative: 4 %
HCT: 40.5 % (ref 39.0–52.0)
Hemoglobin: 14.3 g/dL (ref 13.0–17.0)
Immature Granulocytes: 0 %
Lymphocytes Relative: 36 %
Lymphs Abs: 4 10*3/uL (ref 0.7–4.0)
MCH: 31.4 pg (ref 26.0–34.0)
MCHC: 35.3 g/dL (ref 30.0–36.0)
MCV: 89 fL (ref 80.0–100.0)
Monocytes Absolute: 0.7 10*3/uL (ref 0.1–1.0)
Monocytes Relative: 7 %
Neutro Abs: 5.8 10*3/uL (ref 1.7–7.7)
Neutrophils Relative %: 52 %
Platelets: 193 10*3/uL (ref 150–400)
RBC: 4.55 MIL/uL (ref 4.22–5.81)
RDW: 11.4 % — ABNORMAL LOW (ref 11.5–15.5)
WBC: 11 10*3/uL — ABNORMAL HIGH (ref 4.0–10.5)
nRBC: 0 % (ref 0.0–0.2)

## 2023-02-21 LAB — HEPATIC FUNCTION PANEL
ALT: 39 U/L (ref 0–44)
AST: 25 U/L (ref 15–41)
Albumin: 3.8 g/dL (ref 3.5–5.0)
Alkaline Phosphatase: 65 U/L (ref 38–126)
Bilirubin, Direct: 0.1 mg/dL (ref 0.0–0.2)
Indirect Bilirubin: 0.5 mg/dL (ref 0.3–0.9)
Total Bilirubin: 0.6 mg/dL (ref 0.3–1.2)
Total Protein: 7.1 g/dL (ref 6.5–8.1)

## 2023-02-21 LAB — LIPID PANEL
Cholesterol: 222 mg/dL — ABNORMAL HIGH (ref 0–200)
HDL: 33 mg/dL — ABNORMAL LOW (ref >40)
LDL Cholesterol: UNDETERMINED mg/dL (ref 0–99)
Total CHOL/HDL Ratio: 6.7 RATIO
Triglycerides: 735 mg/dL — ABNORMAL HIGH (ref <150)
VLDL: UNDETERMINED mg/dL (ref 0–40)

## 2023-02-21 LAB — VITAMIN D 25 HYDROXY (VIT D DEFICIENCY, FRACTURES): Vit D, 25-Hydroxy: 20.53 ng/mL — ABNORMAL LOW (ref 30–100)

## 2023-02-22 LAB — HEMOGLOBIN A1C
Hgb A1c MFr Bld: 12.9 % — ABNORMAL HIGH (ref 4.8–5.6)
Mean Plasma Glucose: 324 mg/dL

## 2023-02-22 LAB — LDL CHOLESTEROL, DIRECT: Direct LDL: 83 mg/dL (ref 0–99)

## 2023-02-23 LAB — MICROALBUMIN / CREATININE URINE RATIO
Creatinine, Urine: 68.6 mg/dL
Microalb Creat Ratio: 102 mg/g creat — ABNORMAL HIGH (ref 0–29)
Microalb, Ur: 69.9 ug/mL — ABNORMAL HIGH

## 2023-04-01 DIAGNOSIS — E119 Type 2 diabetes mellitus without complications: Secondary | ICD-10-CM | POA: Diagnosis not present

## 2023-04-01 DIAGNOSIS — H40033 Anatomical narrow angle, bilateral: Secondary | ICD-10-CM | POA: Diagnosis not present

## 2023-05-23 DIAGNOSIS — E782 Mixed hyperlipidemia: Secondary | ICD-10-CM | POA: Diagnosis not present

## 2023-05-23 DIAGNOSIS — F17210 Nicotine dependence, cigarettes, uncomplicated: Secondary | ICD-10-CM | POA: Diagnosis not present

## 2023-05-23 DIAGNOSIS — E1165 Type 2 diabetes mellitus with hyperglycemia: Secondary | ICD-10-CM | POA: Diagnosis not present

## 2023-05-23 DIAGNOSIS — E559 Vitamin D deficiency, unspecified: Secondary | ICD-10-CM | POA: Diagnosis not present

## 2023-05-23 DIAGNOSIS — Z0001 Encounter for general adult medical examination with abnormal findings: Secondary | ICD-10-CM | POA: Diagnosis not present

## 2023-05-30 ENCOUNTER — Encounter: Payer: Self-pay | Admitting: *Deleted

## 2023-07-07 ENCOUNTER — Other Ambulatory Visit (HOSPITAL_COMMUNITY)
Admission: RE | Admit: 2023-07-07 | Discharge: 2023-07-07 | Disposition: A | Discharge status: Home / Self Care | Payer: BC Managed Care – PPO | Source: Ambulatory Visit | Attending: Internal Medicine | Admitting: Internal Medicine

## 2023-07-07 DIAGNOSIS — E559 Vitamin D deficiency, unspecified: Secondary | ICD-10-CM | POA: Diagnosis not present

## 2023-07-07 DIAGNOSIS — E1165 Type 2 diabetes mellitus with hyperglycemia: Secondary | ICD-10-CM | POA: Diagnosis not present

## 2023-07-07 DIAGNOSIS — Z0001 Encounter for general adult medical examination with abnormal findings: Secondary | ICD-10-CM | POA: Insufficient documentation

## 2023-07-07 LAB — PSA: Prostatic Specific Antigen: 0.5 ng/mL (ref 0.00–4.00)

## 2023-07-07 LAB — LIPID PANEL
Cholesterol: 179 mg/dL (ref 0–200)
HDL: 49 mg/dL (ref >40)
LDL Cholesterol: 65 mg/dL (ref 0–99)
Total CHOL/HDL Ratio: 3.7 RATIO
Triglycerides: 323 mg/dL — ABNORMAL HIGH (ref <150)
VLDL: 65 mg/dL — ABNORMAL HIGH (ref 0–40)

## 2023-07-07 LAB — BASIC METABOLIC PANEL
Anion gap: 10 (ref 5–15)
BUN: 22 mg/dL — ABNORMAL HIGH (ref 6–20)
CO2: 25 mmol/L (ref 22–32)
Calcium: 9.2 mg/dL (ref 8.9–10.3)
Chloride: 100 mmol/L (ref 98–111)
Creatinine, Ser: 1.04 mg/dL (ref 0.61–1.24)
GFR, Estimated: >60 mL/min (ref >60)
Glucose, Bld: 151 mg/dL — ABNORMAL HIGH (ref 70–99)
Potassium: 4 mmol/L (ref 3.5–5.1)
Sodium: 135 mmol/L (ref 135–145)

## 2023-07-07 LAB — VITAMIN D 25 HYDROXY (VIT D DEFICIENCY, FRACTURES): Vit D, 25-Hydroxy: 53.86 ng/mL (ref 30–100)

## 2023-07-08 LAB — MICROALBUMIN / CREATININE URINE RATIO
Creatinine, Urine: 137 mg/dL
Microalb Creat Ratio: 96 mg/g creat — ABNORMAL HIGH (ref 0–29)
Microalb, Ur: 131.7 ug/mL — ABNORMAL HIGH

## 2023-07-10 LAB — HEMOGLOBIN A1C
Hgb A1c MFr Bld: 11.7 % — ABNORMAL HIGH (ref 4.8–5.6)
Mean Plasma Glucose: 289 mg/dL

## 2023-08-24 ENCOUNTER — Ambulatory Visit (HOSPITAL_COMMUNITY)
Admission: RE | Admit: 2023-08-24 | Discharge: 2023-08-24 | Disposition: A | Discharge status: Home / Self Care | Payer: BC Managed Care – PPO | Source: Ambulatory Visit | Attending: Gerontology | Admitting: Gerontology

## 2023-08-24 ENCOUNTER — Other Ambulatory Visit (HOSPITAL_COMMUNITY): Payer: Self-pay | Admitting: Gerontology

## 2023-08-24 ENCOUNTER — Other Ambulatory Visit (HOSPITAL_COMMUNITY)
Admission: RE | Admit: 2023-08-24 | Discharge: 2023-08-24 | Disposition: A | Discharge status: Home / Self Care | Payer: BC Managed Care – PPO | Source: Ambulatory Visit | Attending: Internal Medicine | Admitting: Internal Medicine

## 2023-08-24 DIAGNOSIS — R059 Cough, unspecified: Secondary | ICD-10-CM | POA: Insufficient documentation

## 2023-08-24 DIAGNOSIS — J069 Acute upper respiratory infection, unspecified: Secondary | ICD-10-CM | POA: Insufficient documentation

## 2023-08-24 DIAGNOSIS — E782 Mixed hyperlipidemia: Secondary | ICD-10-CM | POA: Diagnosis not present

## 2023-08-24 DIAGNOSIS — R0989 Other specified symptoms and signs involving the circulatory and respiratory systems: Secondary | ICD-10-CM | POA: Diagnosis not present

## 2023-08-24 DIAGNOSIS — E559 Vitamin D deficiency, unspecified: Secondary | ICD-10-CM | POA: Diagnosis not present

## 2023-08-24 DIAGNOSIS — E1165 Type 2 diabetes mellitus with hyperglycemia: Secondary | ICD-10-CM | POA: Insufficient documentation

## 2023-08-24 DIAGNOSIS — Z23 Encounter for immunization: Secondary | ICD-10-CM | POA: Diagnosis not present

## 2023-08-24 LAB — HEMOGLOBIN A1C
Hgb A1c MFr Bld: 9.6 % — ABNORMAL HIGH (ref 4.8–5.6)
Mean Plasma Glucose: 228.82 mg/dL

## 2023-08-24 LAB — VITAMIN D 25 HYDROXY (VIT D DEFICIENCY, FRACTURES): Vit D, 25-Hydroxy: 69.94 ng/mL (ref 30–100)

## 2023-11-20 ENCOUNTER — Other Ambulatory Visit (HOSPITAL_COMMUNITY)
Admission: RE | Admit: 2023-11-20 | Discharge: 2023-11-20 | Disposition: A | Discharge status: Home / Self Care | Payer: BC Managed Care – PPO | Source: Ambulatory Visit | Attending: Internal Medicine | Admitting: Internal Medicine

## 2023-11-20 DIAGNOSIS — E1165 Type 2 diabetes mellitus with hyperglycemia: Secondary | ICD-10-CM | POA: Diagnosis not present

## 2023-11-20 DIAGNOSIS — E559 Vitamin D deficiency, unspecified: Secondary | ICD-10-CM | POA: Diagnosis not present

## 2023-11-20 DIAGNOSIS — J449 Chronic obstructive pulmonary disease, unspecified: Secondary | ICD-10-CM | POA: Diagnosis not present

## 2023-11-20 DIAGNOSIS — E782 Mixed hyperlipidemia: Secondary | ICD-10-CM | POA: Diagnosis not present

## 2023-11-20 LAB — HEMOGLOBIN A1C
Hgb A1c MFr Bld: 11.6 % — ABNORMAL HIGH (ref 4.8–5.6)
Mean Plasma Glucose: 286.22 mg/dL

## 2023-11-22 LAB — CALCITRIOL (1,25 DI-OH VIT D): Vit D, 1,25-Dihydroxy: 44.4 pg/mL (ref 24.8–81.5)

## 2023-11-23 ENCOUNTER — Encounter: Payer: Self-pay | Admitting: *Deleted

## 2024-01-31 IMAGING — DX DG CHEST 2V
2 series · 2 of 2 positions shown · non-contrast
Comparison: 11/12/2020

CLINICAL DATA: Cough, tobacco abuse

EXAM:
CHEST - 2 VIEW

[chest pa]
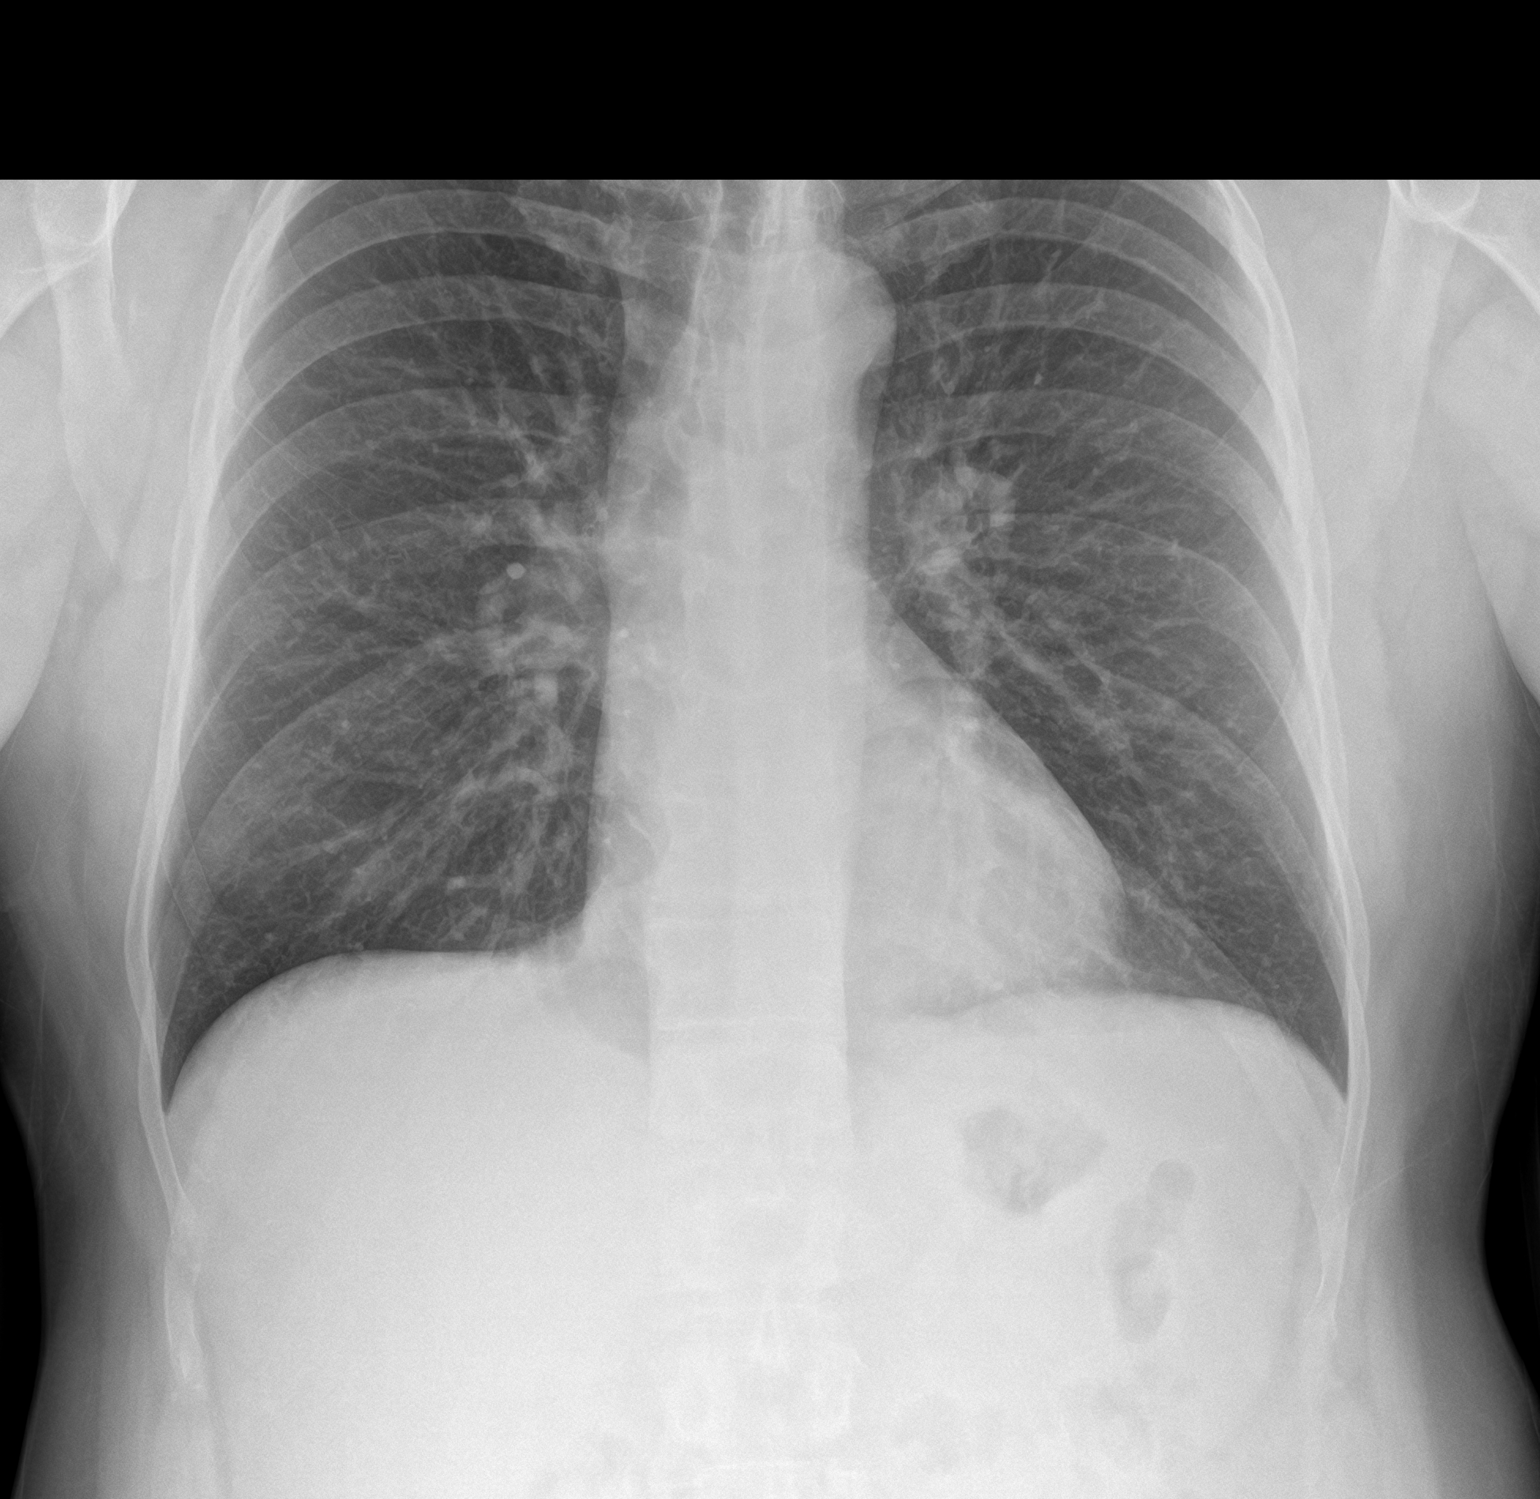

[chest lat]
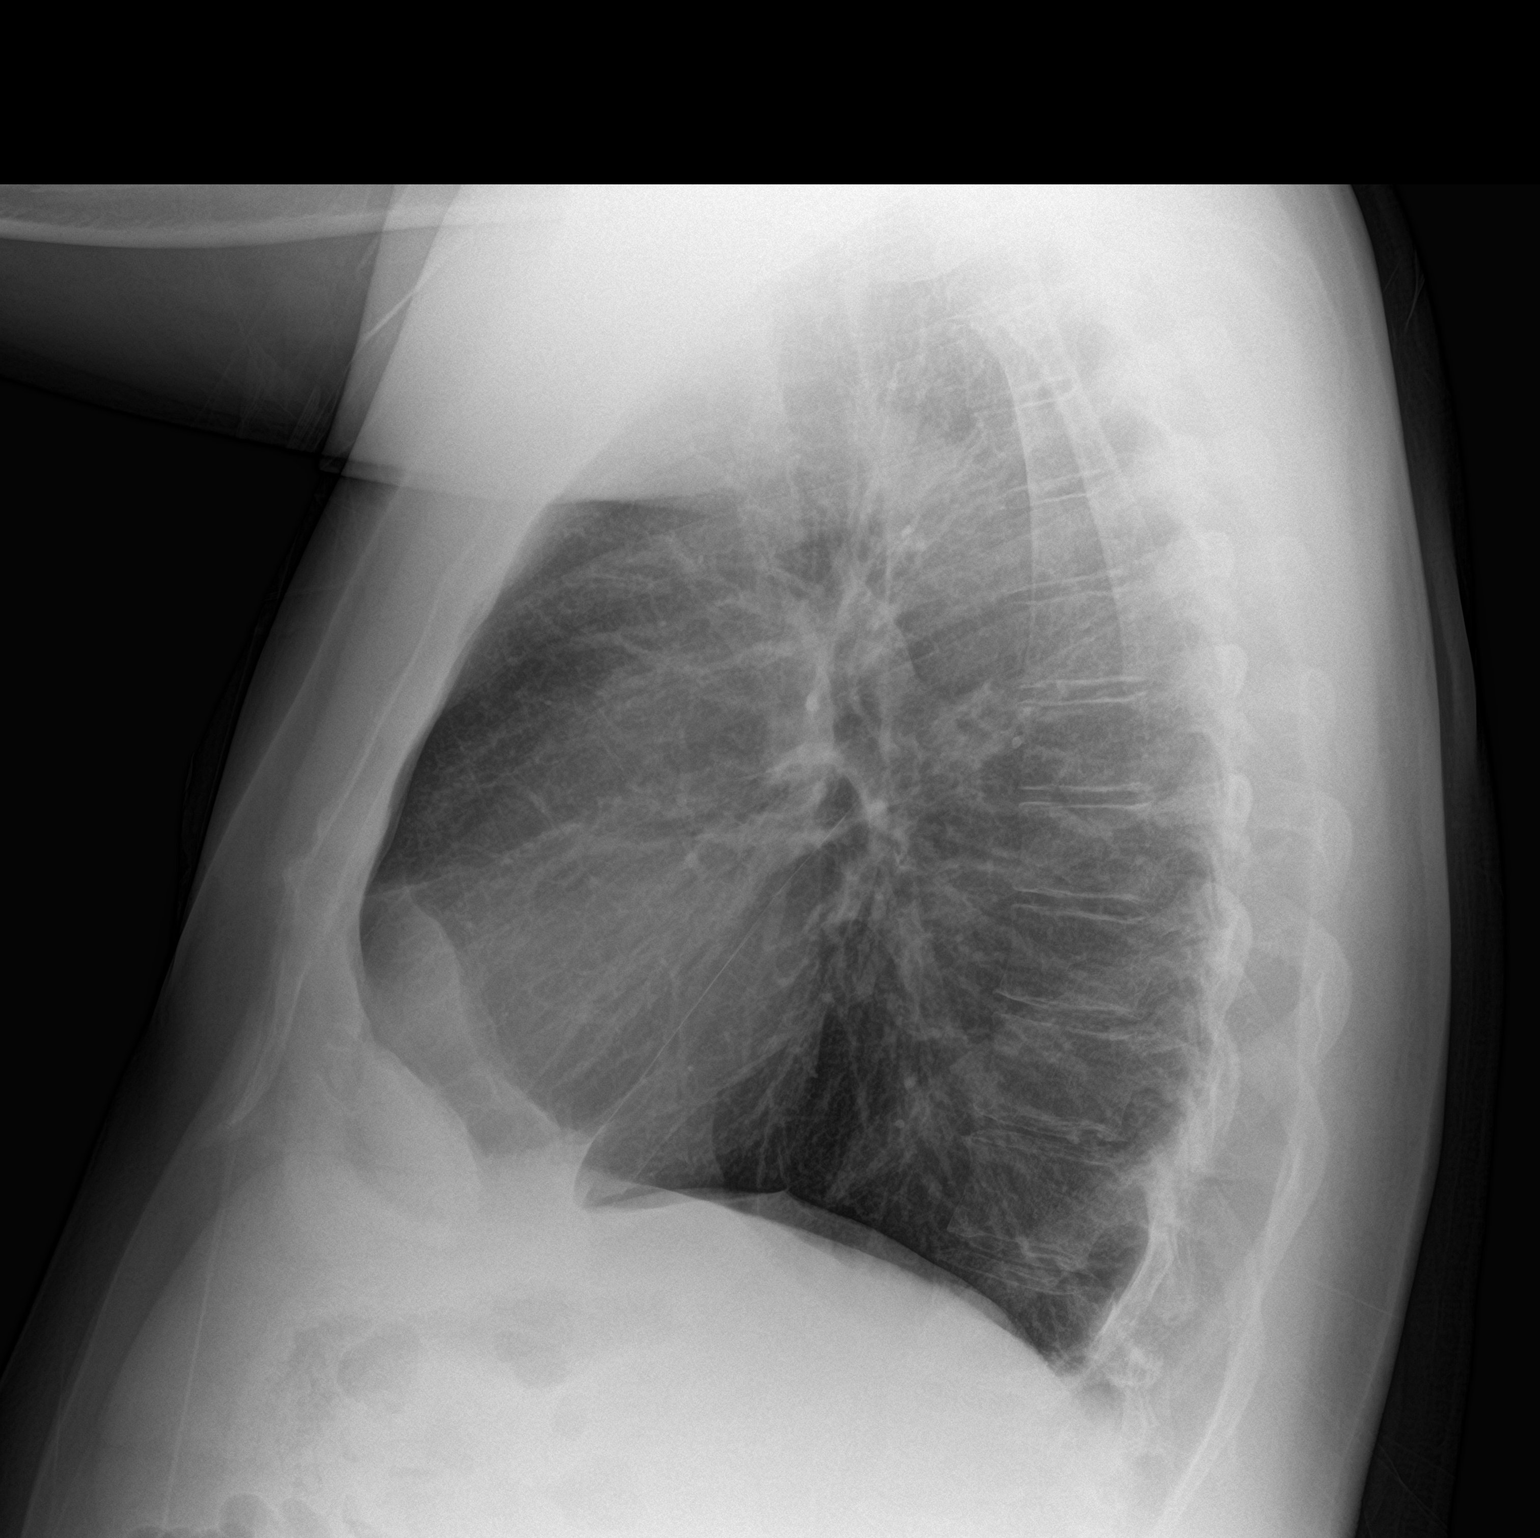

[2 of 2 positions shown; findings below may reference images not displayed]

FINDINGS: Frontal and lateral views of the chest demonstrate an unremarkable
cardiac silhouette. No acute airspace disease, effusion, or
pneumothorax. No acute bony abnormalities.
IMPRESSION: 1. No acute intrathoracic process.

## 2024-02-19 DIAGNOSIS — J449 Chronic obstructive pulmonary disease, unspecified: Secondary | ICD-10-CM | POA: Diagnosis not present

## 2024-02-19 DIAGNOSIS — I5022 Chronic systolic (congestive) heart failure: Secondary | ICD-10-CM | POA: Diagnosis not present

## 2024-02-19 DIAGNOSIS — E1165 Type 2 diabetes mellitus with hyperglycemia: Secondary | ICD-10-CM | POA: Diagnosis not present

## 2024-02-19 DIAGNOSIS — E782 Mixed hyperlipidemia: Secondary | ICD-10-CM | POA: Diagnosis not present

## 2024-06-28 ENCOUNTER — Ambulatory Visit
Admission: EM | Admit: 2024-06-28 | Discharge: 2024-06-28 | Disposition: A | Discharge status: Home / Self Care | Payer: Self-pay | Attending: Family Medicine | Admitting: Family Medicine

## 2024-06-28 DIAGNOSIS — Z794 Long term (current) use of insulin: Secondary | ICD-10-CM

## 2024-06-28 DIAGNOSIS — J441 Chronic obstructive pulmonary disease with (acute) exacerbation: Secondary | ICD-10-CM

## 2024-06-28 DIAGNOSIS — E1165 Type 2 diabetes mellitus with hyperglycemia: Secondary | ICD-10-CM

## 2024-06-28 DIAGNOSIS — R739 Hyperglycemia, unspecified: Secondary | ICD-10-CM

## 2024-06-28 LAB — POCT FASTING CBG KUC MANUAL ENTRY: POCT Glucose (KUC): 230 mg/dL — AB (ref 70–99)

## 2024-06-28 MED ORDER — AZITHROMYCIN 250 MG PO TABS: ORAL_TABLET | ORAL | 0 refills | Status: DC

## 2024-06-28 MED ORDER — IPRATROPIUM-ALBUTEROL 0.5-2.5 (3) MG/3ML IN SOLN
3.0000 mL | Freq: Once | RESPIRATORY_TRACT | Status: AC
  Administered 2024-06-28: 3 mL via RESPIRATORY_TRACT

## 2024-06-28 MED ORDER — FLUTICASONE PROPIONATE HFA 110 MCG/ACT IN AERO: 2.0000 | INHALATION_SPRAY | Freq: Two times a day (BID) | RESPIRATORY_TRACT | 0 refills | Status: DC

## 2024-06-28 MED ORDER — ALBUTEROL SULFATE HFA 108 (90 BASE) MCG/ACT IN AERS: 2.0000 | INHALATION_SPRAY | RESPIRATORY_TRACT | 0 refills | Status: DC | PRN

## 2024-06-28 NOTE — Discharge Instructions (Signed)
 I have prescribed an antibiotic as well as a rescue inhaler and steroid inhaler that I hope are more affordable than the Trelegy for you currently.  You may also use plain Mucinex , Coricidin HBP, Flonase  nasal spray, saline sinus rinses.  Follow-up for significantly worsening symptoms.

## 2024-06-28 NOTE — ED Triage Notes (Signed)
 Pt states that he has some sob, coughing, nasal congestion and fever. X3-4 days

## 2024-06-28 NOTE — ED Provider Notes (Signed)
 [facility]    CSN: [id number] Arrival date & time: 06/28/24  1307      History   Chief Complaint Chief Complaint  Patient presents with   Shortness of Breath    Sob, cough,nasal congestion and fever x3-4 days    HPI Christopher Cross is a 58 y.o. male.   Patient presenting today with 4-day history of progressively worsening low-grade fever, productive cough, chest tightness, wheezing, shortness of breath, sore throat, congestion.  Denies chest pain, abdominal pain, vomiting, diarrhea.  So far trying over-the-counter cough syrups with minimal relief.  History of asthma and COPD, states he is supposed to be on Trelegy and albuterol  but lost his job and his insurance so he is unable to afford these at this time.    Past Medical History:  Diagnosis Date   Asthma    Diabetes mellitus without complication (HCC)    Other emphysema (HCC)    Unspecified essential hypertension     Patient Active Problem List   Diagnosis Date Noted   Acute systolic heart failure (HCC)    COPD exacerbation (HCC) 11/11/2020   Uncontrolled type 2 diabetes mellitus with hypoglycemia (HCC) 11/11/2020   Hypokalemia 11/11/2020   Elevated troponin 11/11/2020   Prolonged QT interval 11/11/2020   Tobacco use 11/11/2020   Transaminitis 11/11/2020   Chest pain 07/15/2014   HTN (hypertension) 07/15/2014    Past Surgical History:  Procedure Laterality Date   RIGHT/LEFT HEART CATH AND CORONARY ANGIOGRAPHY N/A 11/13/2020   Procedure: RIGHT/LEFT HEART CATH AND CORONARY ANGIOGRAPHY;  Surgeon: Dann Candyce RAMAN, MD;  Location: [facility];  Service: Cardiovascular;  Laterality: N/A;       Home Medications    Prior to Admission medications   Medication Sig Start Date End Date Taking? Authorizing Provider  acetaminophen  (TYLENOL ) 500 MG tablet Take 1,000 mg every 6 (six) hours as needed by mouth for headache.   Yes [provider]  albuterol  (VENTOLIN  HFA) 108 (90 Base) MCG/ACT  inhaler Inhale 2 puffs into the lungs every 4 (four) hours as needed. 06/28/24  Yes Stuart Vernell Norris, PA-C  atorvastatin  (LIPITOR) 40 MG tablet Take 1 tablet (40 mg total) by mouth every evening. 11/15/20  Yes Regalado, Belkys A, MD  azithromycin  (ZITHROMAX ) 250 MG tablet Take first 2 tablets together, then 1 every day until finished. 06/28/24  Yes Stuart Vernell Norris, PA-C  blood glucose meter kit and supplies Dispense based on patient and insurance preference. Use up to four times daily as directed. (FOR ICD-10 E10.9, E11.9). 11/15/20  Yes Regalado, Belkys A, MD  calcium  carbonate (TUMS - DOSED IN MG ELEMENTAL CALCIUM ) 500 MG chewable tablet Chew 2 tablets daily as needed by mouth for indigestion or heartburn.   Yes [provider]  fluticasone  (FLOVENT  HFA) 110 MCG/ACT inhaler Inhale 2 puffs into the lungs 2 (two) times daily. Rinse mouth with water after each use 06/28/24  Yes Stuart Vernell Norris, PA-C  insulin  isophane & regular human (NOVOLIN  70/30 FLEXPEN RELION) (70-30) 100 UNIT/ML KwikPen Inject 40 Units into the skin 2 (two) times daily before a meal. 11/15/20  Yes Regalado, Belkys A, MD  Insulin  Pen Needle (PEN NEEDLES 3/16) 31G X 5 MM MISC 1 application by Does not apply route 2 (two) times daily. 11/15/20  Yes Regalado, Belkys A, MD  losartan  (COZAAR ) 100 MG tablet TAKE 1 TABLET BY MOUTH ONCE DAILY . APPOINTMENT REQUIRED FOR FUTURE REFILLS 05/20/22  Yes Branch, Dorn FALCON, MD  metFORMIN  (GLUCOPHAGE )  1000 MG tablet Take 1 tablet (1,000 mg total) 2 (two) times daily by mouth. Patient taking differently: Take 2,000 mg by mouth every evening. 10/22/17  Yes Lynwood Anes, MD  metoprolol  succinate (TOPROL -XL) 50 MG 24 hr tablet TAKE 1 TABLET BY MOUTH ONCE DAILY . APPOINTMENT REQUIRED FOR FUTURE REFILLS 05/20/22  Yes Branch, Dorn FALCON, MD  Multiple Vitamin (MULTIVITAMIN WITH MINERALS) TABS tablet Take 1 tablet by mouth every evening.    Yes [provider]  rivaroxaban   (XARELTO ) 20 MG TABS tablet Take 1 tablet (20 mg total) by mouth daily with lunch. 12/31/20  Yes Branch, Dorn FALCON, MD  sitaGLIPtin (JANUVIA) 100 MG tablet Take 100 mg by mouth every evening.  04/16/18  Yes [provider]  albuterol  (PROAIR  HFA) 108 (90 Base) MCG/ACT inhaler Inhale 1-2 puffs into the lungs every 6 (six) hours as needed for wheezing or shortness of breath. 11/15/20   Regalado, Belkys A, MD  Fluticasone -Salmeterol (ADVAIR DISKUS) 100-50 MCG/DOSE AEPB Inhale 1 puff into the lungs in the morning and at bedtime. 11/15/20 11/15/21  Regalado, Belkys A, MD  folic acid  (FOLVITE ) 1 MG tablet Take 1 tablet (1 mg total) by mouth daily. 11/15/20   Regalado, Belkys A, MD  guaiFENesin  (MUCINEX ) 600 MG 12 hr tablet Take 1 tablet (600 mg total) by mouth 2 (two) times daily. 11/15/20   Regalado, Belkys A, MD  nicotine  (NICODERM CQ  - DOSED IN MG/24 HOURS) 21 mg/24hr patch Place 1 patch (21 mg total) onto the skin daily as needed (For nicotine  withdrawal symptoms.). 11/15/20   Regalado, Belkys A, MD  nitroGLYCERIN  (NITROSTAT ) 0.4 MG SL tablet Place 1 tablet (0.4 mg total) under the tongue every 5 (five) minutes as needed for chest pain. 11/15/20   Regalado, Belkys A, MD  SPIRIVA  HANDIHALER 18 MCG inhalation capsule Place 1 capsule (18 mcg total) into inhaler and inhale daily. 11/15/20   Regalado, Belkys A, MD  spironolactone  (ALDACTONE ) 25 MG tablet Take 0.5 tablets (12.5 mg total) by mouth daily. 01/21/22   Alvan Dorn FALCON, MD    Family History Family History  Problem Relation Age of Onset   Heart attack Other        Grandfather   Heart attack Other        Uncle    Social History Social History   Tobacco Use   Smoking status: Every Day    Current packs/day: 2.00    Average packs/day: 2.0 packs/day for 8.5 years (17.0 ttl pk-yrs)    Types: Cigarettes    Start date: 12/21/2015   Smokeless tobacco: Never   Tobacco comments:    started back after about 15 days   Vaping Use    Vaping status: Never Used  Substance Use Topics   Alcohol use: Yes    Alcohol/week: 0.0 standard drinks of alcohol    Comment: daily   Drug use: No     Allergies   Patient has no known allergies.   Review of Systems Review of Systems Per HPI  Physical Exam Triage Vital Signs ED Triage Vitals  Encounter Vitals Group     BP 06/28/24 1320 132/79     Girls Systolic BP Percentile --      Girls Diastolic BP Percentile --      Boys Systolic BP Percentile --      Boys Diastolic BP Percentile --      Pulse Rate 06/28/24 1320 85     Resp 06/28/24 1320 17     Temp  06/28/24 1320 98.6 F (37 C)     Temp Source 06/28/24 1320 Oral     SpO2 06/28/24 1320 93 %     Weight 06/28/24 1317 220 lb (99.8 kg)     Height 06/28/24 1317 5' 9 (1.753 m)     Head Circumference --      Peak Flow --      Pain Score 06/28/24 1317 0     Pain Loc --      Pain Education --      Exclude from Growth Chart --    No data found.  Updated Vital Signs BP 132/79 (BP Location: Right Arm)   Pulse 85   Temp 98.6 F (37 C) (Oral)   Resp 17   Ht 5' 9 (1.753 m)   Wt 220 lb (99.8 kg)   SpO2 93%   BMI 32.49 kg/m   Visual Acuity Right Eye Distance:   Left Eye Distance:   Bilateral Distance:    Right Eye Near:   Left Eye Near:    Bilateral Near:     Physical Exam Vitals and nursing note reviewed.  Constitutional:      Appearance: He is well-developed.  HENT:     Head: Atraumatic.     Right Ear: External ear normal.     Left Ear: External ear normal.     Nose: Rhinorrhea present.     Mouth/Throat:     Mouth: Mucous membranes are moist.     Pharynx: Posterior oropharyngeal erythema present. No oropharyngeal exudate.  Eyes:     Conjunctiva/sclera: Conjunctivae normal.     Pupils: Pupils are equal, round, and reactive to light.  Cardiovascular:     Rate and Rhythm: Normal rate and regular rhythm.  Pulmonary:     Effort: Pulmonary effort is normal. No respiratory distress.     Breath sounds:  Wheezing present. No rales.  Musculoskeletal:        General: Normal range of motion.     Cervical back: Normal range of motion and neck supple.  Lymphadenopathy:     Cervical: No cervical adenopathy.  Skin:    General: Skin is warm and dry.  Neurological:     Mental Status: He is alert and oriented to person, place, and time.  Psychiatric:        Behavior: Behavior normal.      UC Treatments / Results  Labs (all labs ordered are listed, but only abnormal results are displayed) Labs Reviewed  POCT FASTING CBG [facility] MANUAL ENTRY - Abnormal; Notable for the following components:      Result Value   POCT Glucose ([facility]) 230 (*)    All other components within normal limits    EKG   Radiology No results found.  Procedures Procedures (including critical care time)  Medications Ordered in UC Medications  ipratropium-albuterol  (DUONEB) 0.5-2.5 (3) MG/3ML nebulizer solution 3 mL (3 mLs Nebulization Given 06/28/24 1408)    Initial Impression / Assessment and Plan / UC Course  I have reviewed the triage vital signs and the nursing notes.  Pertinent labs & imaging results that were available during my care of the patient were reviewed by me and considered in my medical decision making (see chart for details).     Overall vitals within normal limits, he is overall well-appearing but significant wheezes on exam.  These were improved with DuoNeb treatment in clinic.  Of note, he has also not been able to afford his insulin .  He is  using some sort of over-the-counter insulin  but has missed several days out of the past 3 to 4 days of his dosing.  States he did take his medicine today.  Has not been checking his home blood sugars.  Point-of-care random glucose today was 230.  Discussed importance of tight blood sugar control, will forego any systemic steroids today in light of these blood sugar readings.  Will see if Flovent  inhaler is more affordable than Trelegy and refill albuterol .   Zithromax  additionally for COPD exacerbation.  Discussed supportive over-the-counter medications, home care.  Return for worsening symptoms.  Final Clinical Impressions(s) / UC Diagnoses   Final diagnoses:  COPD exacerbation (HCC)  Hyperglycemia  Type 2 diabetes mellitus with hyperglycemia, with long-term current use of insulin  [facility])     Discharge Instructions      I have prescribed an antibiotic as well as a rescue inhaler and steroid inhaler that I hope are more affordable than the Trelegy for you currently.  You may also use plain Mucinex , Coricidin HBP, Flonase  nasal spray, saline sinus rinses.  Follow-up for significantly worsening symptoms.    ED Prescriptions     Medication Sig Dispense Auth. Provider   azithromycin  (ZITHROMAX ) 250 MG tablet Take first 2 tablets together, then 1 every day until finished. 6 tablet Stuart Vernell Norris, PA-C   albuterol  (VENTOLIN  HFA) 108 (90 Base) MCG/ACT inhaler Inhale 2 puffs into the lungs every 4 (four) hours as needed. 18 g Stuart Vernell Norris, PA-C   fluticasone  (FLOVENT  HFA) 110 MCG/ACT inhaler Inhale 2 puffs into the lungs 2 (two) times daily. Rinse mouth with water after each use 1 each Stuart Vernell Norris, PA-C      PDMP not reviewed this encounter.   Stuart Vernell Norris, [place] 06/28/24 1441

## 2024-11-16 ENCOUNTER — Emergency Department (HOSPITAL_COMMUNITY): Payer: Self-pay

## 2024-11-16 ENCOUNTER — Inpatient Hospital Stay (HOSPITAL_COMMUNITY)
Admission: EM | Admit: 2024-11-16 | Discharge: 2024-11-19 | DRG: 280 | Disposition: A | Payer: Self-pay | Attending: Family Medicine | Admitting: Family Medicine

## 2024-11-16 DIAGNOSIS — Z7901 Long term (current) use of anticoagulants: Secondary | ICD-10-CM

## 2024-11-16 DIAGNOSIS — Z7141 Alcohol abuse counseling and surveillance of alcoholic: Secondary | ICD-10-CM

## 2024-11-16 DIAGNOSIS — Z7984 Long term (current) use of oral hypoglycemic drugs: Secondary | ICD-10-CM

## 2024-11-16 DIAGNOSIS — Z888 Allergy status to other drugs, medicaments and biological substances status: Secondary | ICD-10-CM

## 2024-11-16 DIAGNOSIS — J438 Other emphysema: Secondary | ICD-10-CM | POA: Diagnosis present

## 2024-11-16 DIAGNOSIS — Z59868 Other specified financial insecurity: Secondary | ICD-10-CM

## 2024-11-16 DIAGNOSIS — I10 Essential (primary) hypertension: Secondary | ICD-10-CM | POA: Diagnosis present

## 2024-11-16 DIAGNOSIS — Z8249 Family history of ischemic heart disease and other diseases of the circulatory system: Secondary | ICD-10-CM

## 2024-11-16 DIAGNOSIS — T383X6A Underdosing of insulin and oral hypoglycemic [antidiabetic] drugs, initial encounter: Secondary | ICD-10-CM | POA: Diagnosis present

## 2024-11-16 DIAGNOSIS — F1010 Alcohol abuse, uncomplicated: Secondary | ICD-10-CM | POA: Diagnosis present

## 2024-11-16 DIAGNOSIS — Z5971 Insufficient health insurance coverage: Secondary | ICD-10-CM

## 2024-11-16 DIAGNOSIS — I2510 Atherosclerotic heart disease of native coronary artery without angina pectoris: Secondary | ICD-10-CM | POA: Diagnosis present

## 2024-11-16 DIAGNOSIS — E785 Hyperlipidemia, unspecified: Secondary | ICD-10-CM | POA: Diagnosis present

## 2024-11-16 DIAGNOSIS — Z794 Long term (current) use of insulin: Secondary | ICD-10-CM

## 2024-11-16 DIAGNOSIS — E1110 Type 2 diabetes mellitus with ketoacidosis without coma: Secondary | ICD-10-CM | POA: Diagnosis present

## 2024-11-16 DIAGNOSIS — Z716 Tobacco abuse counseling: Secondary | ICD-10-CM

## 2024-11-16 DIAGNOSIS — R739 Hyperglycemia, unspecified: Secondary | ICD-10-CM

## 2024-11-16 DIAGNOSIS — F1090 Alcohol use, unspecified, uncomplicated: Secondary | ICD-10-CM | POA: Diagnosis present

## 2024-11-16 DIAGNOSIS — Z79899 Other long term (current) drug therapy: Secondary | ICD-10-CM

## 2024-11-16 DIAGNOSIS — Z23 Encounter for immunization: Secondary | ICD-10-CM

## 2024-11-16 DIAGNOSIS — I5022 Chronic systolic (congestive) heart failure: Secondary | ICD-10-CM

## 2024-11-16 DIAGNOSIS — I2489 Other forms of acute ischemic heart disease: Secondary | ICD-10-CM | POA: Diagnosis present

## 2024-11-16 DIAGNOSIS — I428 Other cardiomyopathies: Secondary | ICD-10-CM | POA: Diagnosis present

## 2024-11-16 DIAGNOSIS — I5043 Acute on chronic combined systolic (congestive) and diastolic (congestive) heart failure: Secondary | ICD-10-CM | POA: Diagnosis present

## 2024-11-16 DIAGNOSIS — I513 Intracardiac thrombosis, not elsewhere classified: Secondary | ICD-10-CM | POA: Diagnosis present

## 2024-11-16 DIAGNOSIS — I252 Old myocardial infarction: Secondary | ICD-10-CM

## 2024-11-16 DIAGNOSIS — I110 Hypertensive heart disease with heart failure: Secondary | ICD-10-CM | POA: Diagnosis present

## 2024-11-16 DIAGNOSIS — Z91148 Patient's other noncompliance with medication regimen for other reason: Secondary | ICD-10-CM

## 2024-11-16 DIAGNOSIS — Z7982 Long term (current) use of aspirin: Secondary | ICD-10-CM

## 2024-11-16 DIAGNOSIS — Z720 Tobacco use: Secondary | ICD-10-CM | POA: Diagnosis present

## 2024-11-16 DIAGNOSIS — F17210 Nicotine dependence, cigarettes, uncomplicated: Secondary | ICD-10-CM | POA: Diagnosis present

## 2024-11-16 DIAGNOSIS — I214 Non-ST elevation (NSTEMI) myocardial infarction: Principal | ICD-10-CM | POA: Diagnosis present

## 2024-11-16 DIAGNOSIS — J4489 Other specified chronic obstructive pulmonary disease: Secondary | ICD-10-CM | POA: Diagnosis present

## 2024-11-16 LAB — CBG MONITORING, ED
Glucose-Capillary: 294 mg/dL — ABNORMAL HIGH (ref 70–99)
Glucose-Capillary: 195 mg/dL — ABNORMAL HIGH (ref 70–99)

## 2024-11-16 LAB — BASIC METABOLIC PANEL WITH GFR
Anion gap: 18 — ABNORMAL HIGH (ref 5–15)
Anion gap: 12 (ref 5–15)
BUN: 13 mg/dL (ref 6–20)
BUN: 11 mg/dL (ref 6–20)
CO2: 19 mmol/L — ABNORMAL LOW (ref 22–32)
CO2: 23 mmol/L (ref 22–32)
Calcium: 9.4 mg/dL (ref 8.9–10.3)
Calcium: 8.9 mg/dL (ref 8.9–10.3)
Chloride: 97 mmol/L — ABNORMAL LOW (ref 98–111)
Chloride: 100 mmol/L (ref 98–111)
Creatinine, Ser: 0.81 mg/dL (ref 0.61–1.24)
Creatinine, Ser: 0.71 mg/dL (ref 0.61–1.24)
GFR, Estimated: >60 mL/min (ref >60)
GFR, Estimated: >60 mL/min (ref >60)
Glucose, Bld: 364 mg/dL — ABNORMAL HIGH (ref 70–99)
Glucose, Bld: 197 mg/dL — ABNORMAL HIGH (ref 70–99)
Potassium: 4 mmol/L (ref 3.5–5.1)
Potassium: 3.7 mmol/L (ref 3.5–5.1)
Sodium: 135 mmol/L (ref 135–145)
Sodium: 135 mmol/L (ref 135–145)

## 2024-11-16 LAB — TROPONIN T, HIGH SENSITIVITY
Troponin T High Sensitivity: 111 ng/L (ref 0–19)
Troponin T High Sensitivity: 131 ng/L (ref 0–19)
Troponin T High Sensitivity: 122 ng/L (ref 0–19)

## 2024-11-16 LAB — HEPARIN LEVEL (UNFRACTIONATED): Heparin Unfractionated: >1.1 [IU]/mL — ABNORMAL HIGH (ref 0.30–0.70)

## 2024-11-16 LAB — PROTIME-INR
INR: 1 (ref 0.8–1.2)
Prothrombin Time: 13.3 s (ref 11.4–15.2)

## 2024-11-16 LAB — CBC
HCT: 43.9 % (ref 39.0–52.0)
Hemoglobin: 15.9 g/dL (ref 13.0–17.0)
MCH: 33.4 pg (ref 26.0–34.0)
MCHC: 36.2 g/dL — ABNORMAL HIGH (ref 30.0–36.0)
MCV: 92.2 fL (ref 80.0–100.0)
Platelets: 199 K/uL (ref 150–400)
RBC: 4.76 MIL/uL (ref 4.22–5.81)
RDW: 11.4 % — ABNORMAL LOW (ref 11.5–15.5)
WBC: 13.8 K/uL — ABNORMAL HIGH (ref 4.0–10.5)
nRBC: 0 % (ref 0.0–0.2)

## 2024-11-16 LAB — GLUCOSE, CAPILLARY: Glucose-Capillary: 169 mg/dL — ABNORMAL HIGH (ref 70–99)

## 2024-11-16 LAB — APTT: aPTT: 30 s (ref 24–36)

## 2024-11-16 MED ORDER — INSULIN REGULAR(HUMAN) IN NACL 100-0.9 UT/100ML-% IV SOLN
INTRAVENOUS | Status: DC
  Administered 2024-11-16: 18 [IU]/h via INTRAVENOUS
  Administered 2024-11-17: 15 [IU]/h via INTRAVENOUS
  Filled 2024-11-16 (×2): qty 100

## 2024-11-16 MED ORDER — NITROGLYCERIN 0.4 MG SL SUBL
0.4000 mg | SUBLINGUAL_TABLET | SUBLINGUAL | Status: DC | PRN
  Administered 2024-11-16 (×2): 0.4 mg via SUBLINGUAL
  Filled 2024-11-16 (×2): qty 1

## 2024-11-16 MED ORDER — DEXTROSE IN LACTATED RINGERS 5 % IV SOLN: INTRAVENOUS | Status: DC

## 2024-11-16 MED ORDER — ASPIRIN 81 MG PO CHEW
324.0000 mg | CHEWABLE_TABLET | Freq: Once | ORAL | Status: AC
Start: 2024-11-16 — End: 2024-11-16
  Administered 2024-11-16: 324 mg via ORAL
  Filled 2024-11-16: qty 4

## 2024-11-16 MED ORDER — INSULIN ASPART 100 UNIT/ML IJ SOLN
8.0000 [IU] | Freq: Once | INTRAMUSCULAR | Status: AC
Start: 2024-11-16 — End: 2024-11-16
  Administered 2024-11-16: 8 [IU] via SUBCUTANEOUS
  Filled 2024-11-16: qty 1

## 2024-11-16 MED ORDER — HEPARIN (PORCINE) 25000 UT/250ML-% IV SOLN
1350.0000 [IU]/h | INTRAVENOUS | Status: DC
  Administered 2024-11-16: 1250 [IU]/h via INTRAVENOUS
  Administered 2024-11-17: 1350 [IU]/h via INTRAVENOUS
  Filled 2024-11-16 (×2): qty 250

## 2024-11-16 MED ORDER — POTASSIUM CHLORIDE 10 MEQ/100ML IV SOLN
10.0000 meq | INTRAVENOUS | Status: AC
  Administered 2024-11-16 (×2): 10 meq via INTRAVENOUS
  Filled 2024-11-16 (×2): qty 100

## 2024-11-16 MED ORDER — LORAZEPAM 1 MG PO TABS: 0.0000 mg | ORAL_TABLET | Freq: Four times a day (QID) | ORAL | Status: DC

## 2024-11-16 MED ORDER — LACTATED RINGERS IV SOLN: INTRAVENOUS | Status: DC

## 2024-11-16 MED ORDER — ATORVASTATIN CALCIUM 40 MG PO TABS
40.0000 mg | ORAL_TABLET | Freq: Every evening | ORAL | Status: DC
  Administered 2024-11-17: 40 mg via ORAL
  Filled 2024-11-16: qty 1

## 2024-11-16 MED ORDER — HEPARIN BOLUS VIA INFUSION
4000.0000 [IU] | Freq: Once | INTRAVENOUS | Status: AC
  Administered 2024-11-16: 4000 [IU] via INTRAVENOUS

## 2024-11-16 MED ORDER — DEXTROSE 50 % IV SOLN: 0.0000 mL | INTRAVENOUS | Status: DC | PRN

## 2024-11-16 MED ORDER — NICOTINE 21 MG/24HR TD PT24
21.0000 mg | MEDICATED_PATCH | Freq: Once | TRANSDERMAL | Status: AC
  Administered 2024-11-16: 21 mg via TRANSDERMAL
  Filled 2024-11-16: qty 1

## 2024-11-16 MED ORDER — LORAZEPAM 1 MG PO TABS: 0.0000 mg | ORAL_TABLET | Freq: Two times a day (BID) | ORAL | Status: DC

## 2024-11-16 MED ORDER — LACTATED RINGERS IV BOLUS
500.0000 mL | Freq: Once | INTRAVENOUS | Status: AC
  Administered 2024-11-16: 500 mL via INTRAVENOUS

## 2024-11-16 MED ORDER — FOLIC ACID 1 MG PO TABS
1.0000 mg | ORAL_TABLET | Freq: Every day | ORAL | Status: DC
  Administered 2024-11-17 – 2024-11-19 (×3): 1 mg via ORAL
  Filled 2024-11-16 (×3): qty 1

## 2024-11-16 MED ORDER — LORAZEPAM 2 MG/ML IJ SOLN: 1.0000 mg | INTRAMUSCULAR | Status: DC | PRN

## 2024-11-16 MED ORDER — IOHEXOL 350 MG/ML SOLN
100.0000 mL | Freq: Once | INTRAVENOUS | Status: AC | PRN
  Administered 2024-11-16: 100 mL via INTRAVENOUS

## 2024-11-16 MED ORDER — THIAMINE MONONITRATE 100 MG PO TABS
100.0000 mg | ORAL_TABLET | Freq: Every day | ORAL | Status: DC
  Administered 2024-11-17 – 2024-11-19 (×3): 100 mg via ORAL
  Filled 2024-11-16 (×3): qty 1

## 2024-11-16 MED ORDER — THIAMINE HCL 100 MG/ML IJ SOLN: 100.0000 mg | Freq: Every day | INTRAMUSCULAR | Status: DC

## 2024-11-16 MED ORDER — LACTATED RINGERS IV BOLUS
20.0000 mL/kg | Freq: Once | INTRAVENOUS | Status: AC
  Administered 2024-11-16: 1816 mL via INTRAVENOUS

## 2024-11-16 MED ORDER — ADULT MULTIVITAMIN W/MINERALS CH
1.0000 | ORAL_TABLET | Freq: Every day | ORAL | Status: DC
  Administered 2024-11-17 – 2024-11-19 (×3): 1 via ORAL
  Filled 2024-11-16 (×3): qty 1

## 2024-11-16 MED ORDER — LORAZEPAM 1 MG PO TABS: 1.0000 mg | ORAL_TABLET | ORAL | Status: DC | PRN

## 2024-11-16 NOTE — ED Triage Notes (Signed)
 Pt comes in centralized CP started after supper. Pt has hx of MI and this pain is similar. Pt also has bilateral arm pain. A&Ox4.

## 2024-11-16 NOTE — ED Notes (Signed)
 Sallie at [facility] aware of Bed assignment [id number]. Olam

## 2024-11-16 NOTE — Consult Note (Incomplete)
 Cardiology Consultation   Patient ID: Christopher Cross MRN: [id number]; DOB: 23-Mar-1966  Admit date: 11/16/2024 Date of Consult: 11/17/2024  PCP:  Carlette Benita Area, MD   [facility] Providers Cardiologist:  Alvan Carrier, MD        Patient Profile: Christopher Cross is a 58 y.o. male with a hx of T2DM, HTN, asthma/COPD, non-ischemic HFrEF (LVEF30-35%), history of LV thrombus 2021, tobacco use, and history of EtOH use who is being seen 11/17/2024 for the evaluation of chest pain.   History of Present Illness: Christopher Cross reports that today after finishing dinner (ate meatloaf), he started to have retrosternal, sharp chest pain with radiation to the bilateral upper extremities.  Chest pain was also associated with shortness of breath and generalized weakness but no diaphoresis, nausea, or vomiting.  The episode lasted for about 15 minutes until he arrived to the ED; in the ED, he received 2 doses of SL NG, and chest pain resolved immediately within 2 to 5 minutes of receiving the second dose of SL NG. He reports that he had similar pain during a presentation in 2021; at that time, he underwent LHC, which showed no CAD.   Labs were remarkable for anion gap of 18, glucose of 364, troponin 111 =>131 =>122. EKG sinus rhythm with anterior and inferior Q waves.  Past Medical History:  Diagnosis Date   Asthma    Diabetes mellitus without complication (HCC)    Other emphysema (HCC)    Unspecified essential hypertension     Past Surgical History:  Procedure Laterality Date   RIGHT/LEFT HEART CATH AND CORONARY ANGIOGRAPHY N/A 11/13/2020   Procedure: RIGHT/LEFT HEART CATH AND CORONARY ANGIOGRAPHY;  Surgeon: Dann Candyce RAMAN, MD;  Location: [facility] INVASIVE CV LAB;  Service: Cardiovascular;  Laterality: N/A;     Home Medications:  Prior to Admission medications   Medication Sig Start Date End Date Taking? Authorizing Provider  atorvastatin  (LIPITOR) 40 MG tablet Take 1 tablet (40  mg total) by mouth every evening. 11/15/20  Yes Regalado, Belkys A, MD  EPINEPHrine (PRIMATENE MIST IN) Inhale 2 puffs into the lungs daily as needed (shortness of breath).   Yes [provider]  insulin  isophane & regular human (NOVOLIN  70/30 FLEXPEN RELION) (70-30) 100 UNIT/ML KwikPen Inject 40 Units into the skin 2 (two) times daily before a meal. Patient taking differently: Inject 50 Units into the skin daily. 11/15/20  Yes Regalado, Belkys A, MD  losartan  (COZAAR ) 100 MG tablet TAKE 1 TABLET BY MOUTH ONCE DAILY . APPOINTMENT REQUIRED FOR FUTURE REFILLS 05/20/22  Yes Branch, Carrier FALCON, MD  metFORMIN  (GLUCOPHAGE ) 1000 MG tablet Take 1 tablet (1,000 mg total) 2 (two) times daily by mouth. Patient taking differently: Take 1,000 mg by mouth 2 (two) times daily with a meal. 10/22/17  Yes Lynwood Anes, MD  metoprolol  succinate (TOPROL -XL) 50 MG 24 hr tablet TAKE 1 TABLET BY MOUTH ONCE DAILY . APPOINTMENT REQUIRED FOR FUTURE REFILLS 05/20/22  Yes Branch, Carrier FALCON, MD  nitroGLYCERIN  (NITROSTAT ) 0.4 MG SL tablet Place 1 tablet (0.4 mg total) under the tongue every 5 (five) minutes as needed for chest pain. 11/15/20  Yes Regalado, Belkys A, MD  spironolactone  (ALDACTONE ) 50 MG tablet Take 50 mg by mouth every morning. 11/10/24  Yes [provider]  blood glucose meter kit and supplies Dispense based on patient and insurance preference. Use up to four times daily as directed. (FOR ICD-10 E10.9, E11.9). 11/15/20   Regalado, Owen LABOR, MD  Insulin   Pen Needle (PEN NEEDLES 3/16) 31G X 5 MM MISC 1 application by Does not apply route 2 (two) times daily. 11/15/20   Regalado, Belkys A, MD    Scheduled Meds:  atorvastatin   40 mg Oral QPM   folic acid   1 mg Oral Daily   LORazepam   0-4 mg Oral Q6H   Followed by   [START ON 11/19/2024] LORazepam   0-4 mg Oral Q12H   multivitamin with minerals  1 tablet Oral Daily   nicotine   21 mg Transdermal Once   thiamine   100 mg Oral Daily   Or   thiamine   100  mg Intravenous Daily   Continuous Infusions:  dextrose 5% lactated ringers 125 mL/hr at 11/16/24 2224   heparin  1,250 Units/hr (11/16/24 2200)   insulin  5.5 Units/hr (11/16/24 2339)   lactated ringers     PRN Meds: dextrose, LORazepam  **OR** LORazepam , nitroGLYCERIN   Allergies:    Allergies  Allergen Reactions   Xarelto  [Rivaroxaban ] Other (See Comments)    Made pt pee blood    Social History:   Social History   Socioeconomic History   Marital status: Legally Separated    Spouse name: Not on file   Number of children: Not on file   Years of education: Not on file   Highest education level: Not on file  Occupational History   Not on file  Tobacco Use   Smoking status: Every Day    Current packs/day: 2.00    Average packs/day: 2.0 packs/day for 8.9 years (17.8 ttl pk-yrs)    Types: Cigarettes    Start date: 12/21/2015   Smokeless tobacco: Never   Tobacco comments:    started back after about 15 days   Vaping Use   Vaping status: Never Used  Substance and Sexual Activity   Alcohol use: Yes    Alcohol/week: 0.0 standard drinks of alcohol    Comment: daily   Drug use: No   Sexual activity: Not on file  Other Topics Concern   Not on file  Social History Narrative   Not on file   Social Drivers of Health   Financial Resource Strain: Not on file  Food Insecurity: Not on file  Transportation Needs: Not on file  Physical Activity: Not on file  Stress: Not on file  Social Connections: Not on file  Intimate Partner Violence: Not on file    Family History:    Family History  Problem Relation Age of Onset   Heart attack Other        Grandfather   Heart attack Other        Uncle     ROS:  Please see the history of present illness.   All other ROS reviewed and negative.     Physical Exam/Data: Vitals:   11/16/24 2215 11/16/24 2230 11/16/24 2342 11/17/24 0000  BP: (!) 137/105 (!) 129/100 (!) 138/104 (!) 142/106  Pulse: (!) 101 (!) 101 (!) 110 (!) 106   Resp:  20 14   Temp:   97.9 F (36.6 C)   TempSrc:   Oral   SpO2: 98% 97% 96%   Weight:   90.1 kg   Height:   5' 9 (1.753 m)    No intake or output data in the 24 hours ending 11/17/24 0038    11/16/2024   11:42 PM 11/16/2024    5:18 PM 06/28/2024    1:17 PM  Last 3 Weights  Weight (lbs) 198 lb 10.2 oz 200 lb 1.6 oz 220  lb  Weight (kg) 90.1 kg 90.765 kg 99.791 kg     Body mass index is 29.33 kg/m.  General:  Well nourished, well developed, in no acute distress HEENT: normal Neck: no JVD Vascular: Distal pulses 2+ bilaterally Cardiac:  normal S1, S2; RRR; no murmur  Lungs:  clear to auscultation bilaterally, no wheezing, rhonchi or rales  Abd: soft, nontender Ext: no edema Musculoskeletal:  No deformities, BUE and BLE strength normal and equal Skin: warm and dry  Neuro:  CNs 2-12 intact, no focal abnormalities noted Psych:  Normal affect   EKG:  The EKG was personally reviewed and demonstrates:  sinus rhythm with anterior and inferior Q waves  Relevant CV Studies: [facility] 10/2020: LV end diastolic pressure is mildly elevated. LVEDP 22 mm Hg. There is no aortic valve stenosis. No angiographically apparent CAD. Ao 98%, PA sat 77%, PA pressure 33/19, mean PA 26 mm Hg; PCWP 19 mm Hg; CO 6.7 L/min; CI 3.2 Hemodynamic findings consistent with mild pulmonary hypertension.  TTE 10/2020:  1. Left ventricular ejection fraction, by estimation, is 30 to 35%. The  left ventricle has moderately decreased function. The left ventricle  demonstrates regional wall motion abnormalities (see scoring  diagram/findings for description). Suggestive of  possible stress induced cardiomyopathy although ischemic cardiomyopathy is  not excluded. There is mild left ventricular hypertrophy. Left ventricular  diastolic parameters are indeterminate. Definity  contrast utilzed with  evidence of apical LV thrombus,  some of which is loosely organized.   2. Right ventricular systolic function is  normal. The right ventricular  size is normal. Tricuspid regurgitation signal is inadequate for assessing  PA pressure.   3. The mitral valve is grossly normal. Trivial mitral valve  regurgitation.   4. The aortic valve is tricuspid. Aortic valve regurgitation is not  visualized.   5. The inferior vena cava is normal in size with greater than 50%  respiratory variability, suggesting right atrial pressure of 3 mmHg.   Laboratory Data: High Sensitivity Troponin:  No results for input(s): TROPONINIHS in the last 720 hours.   Chemistry Recent Labs  Lab 11/16/24 1752 11/16/24 2205  NA 135 135  K 4.0 3.7  CL 97* 100  CO2 19* 23  GLUCOSE 364* 197*  BUN 13 11  CREATININE 0.81 0.71  CALCIUM  9.4 8.9  GFRNONAA >60 >60  ANIONGAP 18* 12    No results for input(s): PROT, ALBUMIN, AST, ALT, ALKPHOS, BILITOT in the last 168 hours. Lipids No results for input(s): CHOL, TRIG, HDL, LABVLDL, LDLCALC, CHOLHDL in the last 168 hours.  Hematology Recent Labs  Lab 11/16/24 1752  WBC 13.8*  RBC 4.76  HGB 15.9  HCT 43.9  MCV 92.2  MCH 33.4  MCHC 36.2*  RDW 11.4*  PLT 199   Thyroid No results for input(s): TSH, FREET4 in the last 168 hours.  BNPNo results for input(s): BNP, PROBNP in the last 168 hours.  DDimer No results for input(s): DDIMER in the last 168 hours.  Radiology/Studies:  CT Angio Chest/Abd/Pel for Dissection W and/or Wo Contrast Result Date: 11/16/2024 EXAM: CTA CHEST, ABDOMEN AND PELVIS WITHOUT AND WITH CONTRAST 11/16/2024 07:28:28 PM TECHNIQUE: CTA of the chest was performed without and with the administration of 100 mL of iohexol  (OMNIPAQUE ) 350 MG/ML injection. CTA of the abdomen and pelvis was performed without and with the administration of 100 mL of iohexol  (OMNIPAQUE ) 350 MG/ML injection. Multiplanar reformatted images are provided for review. MIP images are provided for review. Automated exposure control, iterative  reconstruction,  and/or weight based adjustment of the mA/kV was utilized to reduce the radiation dose to as low as reasonably achievable. COMPARISON: Same day x-ray. CTA chest 11/10/2020. CT abdomen and pelvis 07/22/2020. CLINICAL HISTORY: Aortic aneurysm suspected. FINDINGS: VASCULATURE: AORTA: Aortic atherosclerotic calcification. No acute aortic syndrome. No abdominal aortic aneurysm. No dissection. PULMONARY ARTERIES: Negative for pulmonary embolism. GREAT VESSELS OF AORTIC ARCH: No acute finding. No dissection. No arterial occlusion or significant stenosis. CELIAC TRUNK: No acute finding. No occlusion or significant stenosis. SUPERIOR MESENTERIC ARTERY: No acute finding. No occlusion or significant stenosis. INFERIOR MESENTERIC ARTERY: No acute finding. No occlusion or significant stenosis. RENAL ARTERIES: No acute finding. No occlusion or significant stenosis. ILIAC ARTERIES: Low-density atherosclerotic plaque in the right common iliac artery causes mild narrowing. No acute finding. No occlusion or significant stenosis. CHEST: MEDIASTINUM: Coronary artery and aortic atherosclerotic calcification. No mediastinal lymphadenopathy. The heart and pericardium demonstrate no acute abnormality. LUNGS AND PLEURA: Mild centrilobular and paraseptal emphysema. Clustered centrilobular micronodules in the left lower lobe compatible with mild small airway infection / inflammation. No focal consolidation or pulmonary edema. No evidence of pleural effusion or pneumothorax. THORACIC BONES AND SOFT TISSUES: No acute bone or soft tissue abnormality. ABDOMEN AND PELVIS: LIVER: Hepatic steatosis. GALLBLADDER AND BILE DUCTS: Gallbladder is unremarkable. No biliary ductal dilatation. SPLEEN: The spleen is unremarkable. PANCREAS: The pancreas is unremarkable. ADRENAL GLANDS: Bilateral adrenal glands demonstrate no acute abnormality. KIDNEYS, URETERS AND BLADDER: 9 mm stone in the lower pole of the right kidney. No hydronephrosis or obstructive  calculi. No perinephric or periureteral stranding. Urinary bladder is unremarkable. GI AND BOWEL: Stomach and duodenal sweep demonstrate no acute abnormality. Normal appendix. There is no bowel obstruction. No abnormal bowel wall thickening or distension. REPRODUCTIVE: Reproductive organs are unremarkable. PERITONEUM AND RETROPERITONEUM: No ascites or free air. LYMPH NODES: No lymphadenopathy. ABDOMINAL BONES AND SOFT TISSUES: Fat-containing right inguinal hernia. No acute abnormality of the bones. No acute soft tissue abnormality. IMPRESSION: 1. No acute aortic syndrome. 2. Mild centrilobular and paraseptal emphysema with clustered centrilobular micronodules in the left lower lobe compatible with mild small airway infection/inflammation. 3. 9 mm nonobstructing right lower pole renal calculus. 4. Hepatic steatosis. Electronically signed by: Norman Gatlin MD 11/16/2024 07:38 PM EST RP Workstation: [id number]   DG Chest Port 1 View Result Date: 11/16/2024 EXAM: 1 VIEW(S) XRAY OF THE CHEST 11/16/2024 06:13:20 PM COMPARISON: 08/24/2023 CLINICAL HISTORY: Chest pain FINDINGS: LUNGS AND PLEURA: No focal pulmonary opacity. No pleural effusion. No pneumothorax. HEART AND MEDIASTINUM: No acute abnormality of the cardiac and mediastinal silhouettes. BONES AND SOFT TISSUES: No acute osseous abnormality. IMPRESSION: 1. No acute cardiopulmonary pathology. Electronically signed by: Norman Gatlin MD 11/16/2024 06:31 PM EST RP Workstation: [id number]     Assessment and Plan: NSTEMI Presents with an episode of sharp, retrosternal chest pain with radiation to the bilateral upper extremities and shortness of breath.  Symptoms lasted for about 15 minutes and resolved with SL NG. Mildly elevated but relatively flat troponin trend (troponin 111 =>131 =>122).  EKG with no acute ischemic changes.  Although he presents with a relatively flat troponin trend, which is not what one would expect with type I NSTEMI, his symptoms are  concerning for a cardiac event. He is now being treated for mild DKA, but I doubt that this would explain myocardial injury.  Presentation is also not consistent with GI etiology.  Therefore, we will treat as an NSTEMI. - S/P ASA 325 mg; continue aspirin  81 mg daily -  Continue heparin  gtt - Atorvastatin  40 mg daily  - TTE in the AM - Check lipid panel and A1C - Will likely need LHC  2. HFrEF Euvolemic with no signs of HF. - Reports compliance with metoprolol  and losartan ; however, it appears that both of these meds expired in 2024 with no subsequent refills. - Continue spirolactone (refilled this month) - Follow up TTE  3. History of LV thrombus - Previously on Xarelto  but stopped it due to hematuria; unclear when but patient believes it was stopped ~2 years ago. No recent bleeding issues - Heparin  gtt as above    Risk Assessment/Risk Scores:    TIMI Risk Score for Unstable Angina or Non-ST Elevation MI:   The patient's TIMI risk score is 2, which indicates a 8% risk of all cause mortality, new or recurrent myocardial infarction or need for urgent revascularization in the next 14 days.  [place]  Heart Association (NYHA) Functional Class NYHA Class II       For questions or updates, please contact [facility] Please consult [email or web address] for contact info under      Signed, Gillian CHRISTELLA Cass, MD  11/17/2024 12:38 AM

## 2024-11-16 NOTE — ED Notes (Signed)
 Patient transported to CT

## 2024-11-16 NOTE — ED Provider Notes (Cosign Needed Addendum)
 [place] EMERGENCY DEPARTMENT AT [facility] Provider Note   CSN: [id number] Arrival date & time: 11/16/24  1653     Patient presents with: Chest Pain   Christopher Cross is a 58 y.o. male with a history including CHF, COPD, nonischemic cardiomyopathy and currently untreated type 2 diabetes presenting with complaints of chest pain which started around 430 today while he was eating his supper.  He describes midsternal sharp chest pain which radiates into his bilateral shoulders and some mild shortness of breath since onset of symptoms.  He denies nausea vomiting, diaphoresis, palpitations.  He has had no treatment prior to arrival.  He states he is currently not taking any of his previously prescribed medications due to financial reasons and lack of insurance.   Patient reports prior MI and states his current symptoms feel similar.  Review of chart however indicates he had a cardiac cath in 2021 revealing no significant coronary disease, was diagnosed with nonischemic cardiomyopathy along with CHF after an echo revealing ejection fraction of 30 to 35%.   The history is provided by the patient.       Prior to Admission medications   Medication Sig Start Date End Date Taking? Authorizing Provider  atorvastatin  (LIPITOR) 40 MG tablet Take 1 tablet (40 mg total) by mouth every evening. 11/15/20  Yes Regalado, Belkys A, MD  EPINEPHrine (PRIMATENE MIST IN) Inhale 2 puffs into the lungs daily as needed (shortness of breath).   Yes [provider]  insulin  isophane & regular human (NOVOLIN  70/30 FLEXPEN RELION) (70-30) 100 UNIT/ML KwikPen Inject 40 Units into the skin 2 (two) times daily before a meal. Patient taking differently: Inject 50 Units into the skin daily. 11/15/20  Yes Regalado, Belkys A, MD  losartan  (COZAAR ) 100 MG tablet TAKE 1 TABLET BY MOUTH ONCE DAILY . APPOINTMENT REQUIRED FOR FUTURE REFILLS 05/20/22  Yes Branch, Dorn FALCON, MD  metFORMIN  (GLUCOPHAGE ) 1000 MG  tablet Take 1 tablet (1,000 mg total) 2 (two) times daily by mouth. Patient taking differently: Take 1,000 mg by mouth 2 (two) times daily with a meal. 10/22/17  Yes Lynwood Anes, MD  metoprolol  succinate (TOPROL -XL) 50 MG 24 hr tablet TAKE 1 TABLET BY MOUTH ONCE DAILY . APPOINTMENT REQUIRED FOR FUTURE REFILLS 05/20/22  Yes Branch, Dorn FALCON, MD  nitroGLYCERIN  (NITROSTAT ) 0.4 MG SL tablet Place 1 tablet (0.4 mg total) under the tongue every 5 (five) minutes as needed for chest pain. 11/15/20  Yes Regalado, Belkys A, MD  spironolactone  (ALDACTONE ) 50 MG tablet Take 50 mg by mouth every morning. 11/10/24  Yes [provider]  blood glucose meter kit and supplies Dispense based on patient and insurance preference. Use up to four times daily as directed. (FOR ICD-10 E10.9, E11.9). 11/15/20   Regalado, Owen A, MD  Insulin  Pen Needle (PEN NEEDLES 3/16) 31G X 5 MM MISC 1 application by Does not apply route 2 (two) times daily. 11/15/20   Regalado, Owen LABOR, MD    Allergies: Xarelto  [rivaroxaban ]    Review of Systems  Constitutional:  Negative for diaphoresis and fever.  HENT:  Negative for congestion and sore throat.   Eyes: Negative.   Respiratory:  Positive for shortness of breath. Negative for cough.   Cardiovascular:  Positive for chest pain.  Gastrointestinal:  Negative for abdominal pain, nausea and vomiting.  Genitourinary: Negative.   Musculoskeletal:  Negative for arthralgias, joint swelling and neck pain.  Skin: Negative.  Negative for rash and wound.  Neurological:  Negative for dizziness, weakness, light-headedness, numbness and headaches.  Psychiatric/Behavioral: Negative.      Updated Vital Signs BP (!) 134/116   Pulse (!) 104   Temp 98.2 F (36.8 C) (Oral)   Resp 17   Ht 5' 9 (1.753 m)   Wt 90.8 kg   SpO2 95%   BMI 29.55 kg/m   Physical Exam Vitals and nursing note reviewed.  Constitutional:      General: He is not in acute distress.    Appearance: He is  well-developed.  HENT:     Head: Normocephalic and atraumatic.  Eyes:     Conjunctiva/sclera: Conjunctivae normal.  Cardiovascular:     Rate and Rhythm: Regular rhythm. Tachycardia present.     Heart sounds: Normal heart sounds.  Pulmonary:     Effort: Pulmonary effort is normal.     Breath sounds: Wheezing present. No rhonchi or rales.     Comments: Mild expiratory wheeze upper lungs, cleared with cough. Abdominal:     General: Bowel sounds are normal.     Palpations: Abdomen is soft.     Tenderness: There is no abdominal tenderness.  Musculoskeletal:        General: Normal range of motion.     Cervical back: Normal range of motion.  Skin:    General: Skin is warm and dry.  Neurological:     Mental Status: He is alert.     (all labs ordered are listed, but only abnormal results are displayed) Labs Reviewed  BASIC METABOLIC PANEL WITH GFR - Abnormal; Notable for the following components:      Result Value   Chloride 97 (*)    CO2 19 (*)    Glucose, Bld 364 (*)    Anion gap 18 (*)    All other components within normal limits  CBC - Abnormal; Notable for the following components:   WBC 13.8 (*)    MCHC 36.2 (*)    RDW 11.4 (*)    All other components within normal limits  CBG MONITORING, ED - Abnormal; Notable for the following components:   Glucose-Capillary 294 (*)    All other components within normal limits  TROPONIN T, HIGH SENSITIVITY - Abnormal; Notable for the following components:   Troponin T High Sensitivity 111 (*)    All other components within normal limits  TROPONIN T, HIGH SENSITIVITY - Abnormal; Notable for the following components:   Troponin T High Sensitivity 131 (*)    All other components within normal limits  BETA-HYDROXYBUTYRIC ACID  HIV ANTIBODY (ROUTINE TESTING W REFLEX)  BASIC METABOLIC PANEL WITH GFR  BASIC METABOLIC PANEL WITH GFR  BASIC METABOLIC PANEL WITH GFR  BASIC METABOLIC PANEL WITH GFR  BETA-HYDROXYBUTYRIC ACID   BETA-HYDROXYBUTYRIC ACID  BETA-HYDROXYBUTYRIC ACID  HEMOGLOBIN A1C  PROTIME-INR  APTT  CBC  TROPONIN T, HIGH SENSITIVITY    EKG: EKG Interpretation Date/Time:  Saturday November 16 2024 17:47:00 EST Ventricular Rate:  109 PR Interval:  166 QRS Duration:  102 QT Interval:  359 QTC Calculation: 484 R Axis:   258  Text Interpretation: Sinus tachycardia Inferior infarct, old Abnormal lateral Q waves Anterior infarct, old Since last tracing has changed Confirmed by Cleotilde Rogue ([id number]) on 11/16/2024 6:10:09 PM  Radiology: CT Angio Chest/Abd/Pel for Dissection W and/or Wo Contrast Result Date: 11/16/2024 EXAM: CTA CHEST, ABDOMEN AND PELVIS WITHOUT AND WITH CONTRAST 11/16/2024 07:28:28 PM TECHNIQUE: CTA of the chest was performed without and with the administration of 100 mL of iohexol  (  OMNIPAQUE ) 350 MG/ML injection. CTA of the abdomen and pelvis was performed without and with the administration of 100 mL of iohexol  (OMNIPAQUE ) 350 MG/ML injection. Multiplanar reformatted images are provided for review. MIP images are provided for review. Automated exposure control, iterative reconstruction, and/or weight based adjustment of the mA/kV was utilized to reduce the radiation dose to as low as reasonably achievable. COMPARISON: Same day x-ray. CTA chest 11/10/2020. CT abdomen and pelvis 07/22/2020. CLINICAL HISTORY: Aortic aneurysm suspected. FINDINGS: VASCULATURE: AORTA: Aortic atherosclerotic calcification. No acute aortic syndrome. No abdominal aortic aneurysm. No dissection. PULMONARY ARTERIES: Negative for pulmonary embolism. GREAT VESSELS OF AORTIC ARCH: No acute finding. No dissection. No arterial occlusion or significant stenosis. CELIAC TRUNK: No acute finding. No occlusion or significant stenosis. SUPERIOR MESENTERIC ARTERY: No acute finding. No occlusion or significant stenosis. INFERIOR MESENTERIC ARTERY: No acute finding. No occlusion or significant stenosis. RENAL ARTERIES: No acute  finding. No occlusion or significant stenosis. ILIAC ARTERIES: Low-density atherosclerotic plaque in the right common iliac artery causes mild narrowing. No acute finding. No occlusion or significant stenosis. CHEST: MEDIASTINUM: Coronary artery and aortic atherosclerotic calcification. No mediastinal lymphadenopathy. The heart and pericardium demonstrate no acute abnormality. LUNGS AND PLEURA: Mild centrilobular and paraseptal emphysema. Clustered centrilobular micronodules in the left lower lobe compatible with mild small airway infection / inflammation. No focal consolidation or pulmonary edema. No evidence of pleural effusion or pneumothorax. THORACIC BONES AND SOFT TISSUES: No acute bone or soft tissue abnormality. ABDOMEN AND PELVIS: LIVER: Hepatic steatosis. GALLBLADDER AND BILE DUCTS: Gallbladder is unremarkable. No biliary ductal dilatation. SPLEEN: The spleen is unremarkable. PANCREAS: The pancreas is unremarkable. ADRENAL GLANDS: Bilateral adrenal glands demonstrate no acute abnormality. KIDNEYS, URETERS AND BLADDER: 9 mm stone in the lower pole of the right kidney. No hydronephrosis or obstructive calculi. No perinephric or periureteral stranding. Urinary bladder is unremarkable. GI AND BOWEL: Stomach and duodenal sweep demonstrate no acute abnormality. Normal appendix. There is no bowel obstruction. No abnormal bowel wall thickening or distension. REPRODUCTIVE: Reproductive organs are unremarkable. PERITONEUM AND RETROPERITONEUM: No ascites or free air. LYMPH NODES: No lymphadenopathy. ABDOMINAL BONES AND SOFT TISSUES: Fat-containing right inguinal hernia. No acute abnormality of the bones. No acute soft tissue abnormality. IMPRESSION: 1. No acute aortic syndrome. 2. Mild centrilobular and paraseptal emphysema with clustered centrilobular micronodules in the left lower lobe compatible with mild small airway infection/inflammation. 3. 9 mm nonobstructing right lower pole renal calculus. 4. Hepatic  steatosis. Electronically signed by: Norman Gatlin MD 11/16/2024 07:38 PM EST RP Workstation: [id number]   DG Chest Port 1 View Result Date: 11/16/2024 EXAM: 1 VIEW(S) XRAY OF THE CHEST 11/16/2024 06:13:20 PM COMPARISON: 08/24/2023 CLINICAL HISTORY: Chest pain FINDINGS: LUNGS AND PLEURA: No focal pulmonary opacity. No pleural effusion. No pneumothorax. HEART AND MEDIASTINUM: No acute abnormality of the cardiac and mediastinal silhouettes. BONES AND SOFT TISSUES: No acute osseous abnormality. IMPRESSION: 1. No acute cardiopulmonary pathology. Electronically signed by: Norman Gatlin MD 11/16/2024 06:31 PM EST RP Workstation: [id number]     Procedures   Medications Ordered in the ED  nitroGLYCERIN  (NITROSTAT ) SL tablet 0.4 mg (0.4 mg Sublingual Given 11/16/24 1843)  nicotine  (NICODERM CQ  - dosed in mg/24 hours) patch 21 mg (21 mg Transdermal Patch Applied 11/16/24 1952)  insulin  regular, human (MYXREDLIN) 100 units/ 100 mL infusion (18 Units/hr Intravenous New Bag/Given 11/16/24 2120)  lactated ringers infusion (has no administration in time range)  dextrose 5 % in lactated ringers infusion (has no administration in time range)  dextrose 50 % solution  0-50 mL (has no administration in time range)  potassium chloride  10 mEq in 100 mL IVPB (10 mEq Intravenous New Bag/Given 11/16/24 2122)  heparin  bolus via infusion 4,000 Units (has no administration in time range)  heparin  ADULT infusion 100 units/mL (25000 units/250mL) (has no administration in time range)  aspirin  chewable tablet 324 mg (324 mg Oral Given 11/16/24 1814)  iohexol  (OMNIPAQUE ) 350 MG/ML injection 100 mL (100 mLs Intravenous Contrast Given 11/16/24 1920)  insulin  aspart (novoLOG ) injection 8 Units (8 Units Subcutaneous Given 11/16/24 2001)  lactated ringers bolus 500 mL (0 mLs Intravenous Stopped 11/16/24 2033)  lactated ringers bolus 1,816 mL (1,816 mLs Intravenous New Bag/Given 11/16/24 2121)                                     Medical Decision Making Patient presenting with midsternal chest pain radiating to his shoulders, shortness of breath since 4:30 PM while eating his evening meal.  He does have a history of nonischemic cardiomyopathy, CHF, COPD and diabetes, he has not been able to afford his home medications and has not been treated for these conditions for at least months.  Differential diagnosis including STEMI/NSTEMI, pneumonia, PE, pericarditis, aortic dissection.  Labs and imaging have been obtained as outlined below.  He does have elevation in his troponin x 2, initial troponin was 111, and increased to 131 at the 2-hour mark.  Additionally he has a significant hyperglycemia at 364 he also has an anion gap of 18, his CO2 is reduced at 19.  He has no nausea vomiting, abdominal pain, beta hydroxy butyrate acid that has been added to better define DKA status.  Clinically this does not appear to be DKA.  Amount and/or Complexity of Data Reviewed Labs: ordered.    Details: Pertinent labs as outlined above. Radiology: ordered.    Details: Chest x-ray negative for acute findings.  CT angio dissection study negative for dissection and negative for PE. Discussion of management or test interpretation with external provider(s): Patient discussed with Dr. Otelia with cardiology, requesting start of heparin  drip and transfer to [facility] on the hospitalist service.  He will probably undergo catheterization while he is there.  Discussed patient with Dr. Barbra who accepts patient for admission/transfer to [facility].  Risk OTC drugs. Prescription drug management. Decision regarding hospitalization.    CRITICAL CARE Performed by: Mliss Narrow Total critical care time: 45 minutes Critical care time was exclusive of separately billable procedures and treating other patients. Critical care was necessary to treat or prevent imminent or life-threatening deterioration. Critical care was time spent personally by me on the following  activities: development of treatment plan with patient and/or surrogate as well as nursing, discussions with consultants, evaluation of patient's response to treatment, examination of patient, obtaining history from patient or surrogate, ordering and performing treatments and interventions, ordering and review of laboratory studies, ordering and review of radiographic studies, pulse oximetry and re-evaluation of patient's condition.     Final diagnoses:  NSTEMI (non-ST elevated myocardial infarction) [facility])  Hyperglycemia    ED Discharge Orders     None          Narrow Mliss RIGGERS 11/16/24 2139    Calea Hribar, PA-C 11/16/24 2139    Cleotilde Rogue, MD 11/17/24 1235

## 2024-11-16 NOTE — H&P (Signed)
 History and Physical    Patient: Christopher Cross FMW:[id number] DOB: 08/22/1966 DOA: 11/16/2024 DOS: the patient was seen and examined on 11/16/2024 PCP: Carlette Benita Area, MD  Patient coming from: Home  Chief Complaint:  Chief Complaint  Patient presents with   Chest Pain   HPI: Christopher Cross is a 58 y.o. male with medical history significant of Diabetes, htn, asthma, CAD with history of MI, HFrEF.  Patient presents with chest pain that started around 430.  He had just completed eating dinner and went outside to smoke and have a drink of alcohol.  Chest pain is center, substance sternal and radiates into both arms.  The pain is similar to the pain he felt with previous MI.  He does have 1-2 drinks of alcohol a day and currently smokes.  He is diabetic but does not have insurance.  He uses 70/30 insulin  because that is what he can afford he did run out of insulin  a couple days ago and has not been able to get the insulin  from the pharmacy.  He reports not feeling well for most the day.  He is unable to afford the inhalers that he should be having for his asthma.  Review of Systems: As mentioned in the history of present illness. All other systems reviewed and are negative. Past Medical History:  Diagnosis Date   Asthma    Diabetes mellitus without complication (HCC)    Other emphysema (HCC)    Unspecified essential hypertension    Past Surgical History:  Procedure Laterality Date   RIGHT/LEFT HEART CATH AND CORONARY ANGIOGRAPHY N/A 11/13/2020   Procedure: RIGHT/LEFT HEART CATH AND CORONARY ANGIOGRAPHY;  Surgeon: Dann Candyce RAMAN, MD;  Location: [facility] INVASIVE CV LAB;  Service: Cardiovascular;  Laterality: N/A;   Social History:  reports that he has been smoking cigarettes. He started smoking about 8 years ago. He has a 17.8 pack-year smoking history. He has never used smokeless tobacco. He reports current alcohol use. He reports that he does not use drugs.  Allergies  Allergen  Reactions   Xarelto  [Rivaroxaban ] Other (See Comments)    Made pt pee blood    Family History  Problem Relation Age of Onset   Heart attack Other        Grandfather   Heart attack Other        Uncle    Prior to Admission medications   Medication Sig Start Date End Date Taking? Authorizing Provider  atorvastatin  (LIPITOR) 40 MG tablet Take 1 tablet (40 mg total) by mouth every evening. 11/15/20  Yes Regalado, Belkys A, MD  EPINEPHrine (PRIMATENE MIST IN) Inhale 2 puffs into the lungs daily as needed (shortness of breath).   Yes [provider]  insulin  isophane & regular human (NOVOLIN  70/30 FLEXPEN RELION) (70-30) 100 UNIT/ML KwikPen Inject 40 Units into the skin 2 (two) times daily before a meal. Patient taking differently: Inject 50 Units into the skin daily. 11/15/20  Yes Regalado, Belkys A, MD  losartan  (COZAAR ) 100 MG tablet TAKE 1 TABLET BY MOUTH ONCE DAILY . APPOINTMENT REQUIRED FOR FUTURE REFILLS 05/20/22  Yes Branch, Dorn FALCON, MD  metFORMIN  (GLUCOPHAGE ) 1000 MG tablet Take 1 tablet (1,000 mg total) 2 (two) times daily by mouth. Patient taking differently: Take 1,000 mg by mouth 2 (two) times daily with a meal. 10/22/17  Yes Lynwood Anes, MD  metoprolol  succinate (TOPROL -XL) 50 MG 24 hr tablet TAKE 1 TABLET BY MOUTH ONCE DAILY . APPOINTMENT REQUIRED FOR FUTURE REFILLS  05/20/22  Yes Branch, Dorn FALCON, MD  nitroGLYCERIN  (NITROSTAT ) 0.4 MG SL tablet Place 1 tablet (0.4 mg total) under the tongue every 5 (five) minutes as needed for chest pain. 11/15/20  Yes Regalado, Belkys A, MD  spironolactone  (ALDACTONE ) 50 MG tablet Take 50 mg by mouth every morning. 11/10/24  Yes [provider]  blood glucose meter kit and supplies Dispense based on patient and insurance preference. Use up to four times daily as directed. (FOR ICD-10 E10.9, E11.9). 11/15/20   Regalado, Owen A, MD  Insulin  Pen Needle (PEN NEEDLES 3/16) 31G X 5 MM MISC 1 application by Does not apply route 2 (two)  times daily. 11/15/20   Madelyne Owen LABOR, MD    Physical Exam: Vitals:   11/16/24 2045 11/16/24 2100 11/16/24 2115 11/16/24 2125  BP: (!) 132/98 (!) 135/103 (!) 134/116   Pulse: 98 92 (!) 104   Resp: (!) 24 15 17    Temp:    98.2 F (36.8 C)  TempSrc:    Oral  SpO2: 96% 96% 95%   Weight:      Height:       General: Middle-age male. Awake and alert and oriented x3. No acute cardiopulmonary distress.  HEENT: Normocephalic atraumatic.  Right and left ears normal in appearance.  Pupils equal, round, reactive to light. Extraocular muscles are intact. Sclerae anicteric and noninjected.  Moist mucosal membranes. No mucosal lesions.  Neck: Neck supple without lymphadenopathy. No carotid bruits. No masses palpated.  Cardiovascular: Regular rate with normal S1-S2 sounds. No murmurs, rubs, gallops auscultated. No JVD.  Respiratory: Good respiratory effort with no wheezes, rales, rhonchi. Lungs clear to auscultation bilaterally.  No accessory muscle use. Abdomen: Soft, nontender, nondistended. Active bowel sounds. No masses or hepatosplenomegaly  Skin: No rashes, lesions, or ulcerations.  Dry, warm to touch. 2+ dorsalis pedis and radial pulses. Musculoskeletal: No calf or leg pain. All major joints not erythematous nontender.  No upper or lower joint deformation.  Good ROM.  No contractures  Psychiatric: Intact judgment and insight. Pleasant and cooperative. Neurologic: No focal neurological deficits. Strength is 5/5 and symmetric in upper and lower extremities.  Cranial nerves II through XII are grossly intact.   Data Reviewed: Labs and imaging reviewed by me  EKG shows sinus tachycardia with right axis deviation.  Likely old inferior infarct and anterior infarct.  No ST changes  Assessment and Plan: No notes have been filed under this hospital service. Service: Hospitalist  Principal Problem:   NSTEMI (non-ST elevated myocardial infarction) (HCC) Active Problems:   HTN (hypertension)    Tobacco use   DKA (diabetic ketoacidosis) (HCC)   Alcohol use  NSTEMI Serial troponins Heparin  drip per cardiology, they will see if patient's [facility].  DKA and type 2 diabetes admit to stepdown Telemetry monitoring Continue aggressive IV hydration CBGs every hour Basic metabolic panel every 4 hours Potassium replacement: 10 mEq IV runs 3 Noncaloric liquids Transition to D5 normal saline at 150 mL's per hour with CBG at 250 Transition to home insulin  regimen when gap is closed and acidosis resolved. Chronic alcohol use CIWA Hypertension Blood pressure controlled Asthma/COPD   Advance Care Planning:   Code Status: Full Code confirmed by patient  Consults: Cardiology  Family Communication: Family member present during interview and exam  Severity of Illness: The appropriate patient status for this patient is INPATIENT. Inpatient status is judged to be reasonable and necessary in order to provide the required intensity of service to ensure the  patient's safety. The patient's presenting symptoms, physical exam findings, and initial radiographic and laboratory data in the context of their chronic comorbidities is felt to place them at high risk for further clinical deterioration. Furthermore, it is not anticipated that the patient will be medically stable for discharge from the hospital within 2 midnights of admission.   * I certify that at the point of admission it is my clinical judgment that the patient will require inpatient hospital care spanning beyond 2 midnights from the point of admission due to high intensity of service, high risk for further deterioration and high frequency of surveillance required.*  Author: Jayme Cham J Cheikh Bramble, DO 11/16/2024 9:27 PM  For on call review [email or web address].

## 2024-11-16 NOTE — Consult Note (Signed)
 Pharmacy Consult Note - Anticoagulation  Pharmacy Consult for Heparin  drip Indication: chest pain/ACS Allergies  Allergen Reactions   Xarelto  [Rivaroxaban ] Other (See Comments)    Made pt pee blood    PATIENT MEASUREMENTS: Height: 5' 9 (175.3 cm) Weight: 90.8 kg (200 lb 1.6 oz) IBW/kg (Calculated) : 70.7 HEPARIN  DW (KG): 89.1  VITAL SIGNS: Temp: 98.2 F (36.8 C) (11/29 2125) Temp Source: Oral (11/29 2125) BP: 134/116 (11/29 2115) Pulse Rate: 104 (11/29 2115)  Recent Labs    11/16/24 1752  HGB 15.9  HCT 43.9  PLT 199  CREATININE 0.81    Estimated Creatinine Clearance: 113.4 mL/min (by C-G formula based on SCr of 0.81 mg/dL).  PAST MEDICAL HISTORY: Past Medical History:  Diagnosis Date   Asthma    Diabetes mellitus without complication (HCC)    Other emphysema (HCC)    Unspecified essential hypertension     Medications:  (Not in a hospital admission)  Scheduled:   heparin   4,000 Units Intravenous Once   nicotine   21 mg Transdermal Once   Infusions:   dextrose 5% lactated ringers     heparin      insulin  18 Units/hr (11/16/24 2120)   lactated ringers     potassium chloride  10 mEq (11/16/24 2122)   PRN: dextrose, nitroGLYCERIN   ASSESSMENT: 58 y.o. male with PMH CHF, COPD, nonischemic cardiomyopathy and T2DM is presenting with chest pain. Patient is not on chronic anticoagulation per chart review. Of note patient was previous on Xarelto  but has noted side effect of hematuria. No records of patient picking up Xarelto  in the past 6 months per dispense history. Pharmacy has been consulted to initiate and manage heparin  intravenous infusion.   Goal(s) of therapy: Heparin  level 0.3 - 0.7 units/mL aPTT 66 - 102 seconds Monitor platelets by anticoagulation protocol: Yes   Baseline anticoagulation labs: Recent Labs    11/16/24 1752  HGB 15.9  PLT 199   Baseline HL, aPTT, INR pending  PLAN:  Give 4000 units bolus x1; then start heparin  infusion at 1250  units/hour.  Check heparin  level in 6 hours, then daily once at least two levels are consecutively therapeutic.  Monitor CBC daily while on heparin  infusion.   Annabella LOISE Banks, PharmD Clinical Pharmacist 11/16/2024 9:31 PM

## 2024-11-16 NOTE — ED Notes (Signed)
 Pt feels relief in chest pain after nitroglycerin  tablet; rating pain 3/10.

## 2024-11-17 ENCOUNTER — Other Ambulatory Visit: Payer: Self-pay

## 2024-11-17 ENCOUNTER — Inpatient Hospital Stay (HOSPITAL_COMMUNITY): Payer: Self-pay

## 2024-11-17 ENCOUNTER — Encounter (HOSPITAL_COMMUNITY): Payer: Self-pay | Admitting: Family Medicine

## 2024-11-17 DIAGNOSIS — I214 Non-ST elevation (NSTEMI) myocardial infarction: Secondary | ICD-10-CM

## 2024-11-17 DIAGNOSIS — I5022 Chronic systolic (congestive) heart failure: Secondary | ICD-10-CM

## 2024-11-17 DIAGNOSIS — I428 Other cardiomyopathies: Secondary | ICD-10-CM

## 2024-11-17 DIAGNOSIS — I513 Intracardiac thrombosis, not elsewhere classified: Secondary | ICD-10-CM

## 2024-11-17 DIAGNOSIS — R079 Chest pain, unspecified: Secondary | ICD-10-CM

## 2024-11-17 LAB — BETA-HYDROXYBUTYRIC ACID
Beta-Hydroxybutyric Acid: 0.67 mmol/L — ABNORMAL HIGH (ref 0.05–0.27)
Beta-Hydroxybutyric Acid: 0.08 mmol/L (ref 0.05–0.27)
Beta-Hydroxybutyric Acid: 0.32 mmol/L — ABNORMAL HIGH (ref 0.05–0.27)
Beta-Hydroxybutyric Acid: 0.12 mmol/L (ref 0.05–0.27)

## 2024-11-17 LAB — LIPID PANEL
Cholesterol: 146 mg/dL (ref 0–200)
HDL: 55 mg/dL (ref >40)
LDL Cholesterol: 59 mg/dL (ref 0–99)
Total CHOL/HDL Ratio: 2.7 ratio
Triglycerides: 161 mg/dL — ABNORMAL HIGH (ref <150)
VLDL: 32 mg/dL (ref 0–40)

## 2024-11-17 LAB — ECHOCARDIOGRAM COMPLETE
Height: 69 in
S' Lateral: 4.6 cm
Weight: 3178.15 [oz_av]

## 2024-11-17 LAB — GLUCOSE, CAPILLARY
Glucose-Capillary: 100 mg/dL — ABNORMAL HIGH (ref 70–99)
Glucose-Capillary: 119 mg/dL — ABNORMAL HIGH (ref 70–99)
Glucose-Capillary: 125 mg/dL — ABNORMAL HIGH (ref 70–99)
Glucose-Capillary: 134 mg/dL — ABNORMAL HIGH (ref 70–99)
Glucose-Capillary: 136 mg/dL — ABNORMAL HIGH (ref 70–99)
Glucose-Capillary: 139 mg/dL — ABNORMAL HIGH (ref 70–99)
Glucose-Capillary: 151 mg/dL — ABNORMAL HIGH (ref 70–99)
Glucose-Capillary: 165 mg/dL — ABNORMAL HIGH (ref 70–99)
Glucose-Capillary: 169 mg/dL — ABNORMAL HIGH (ref 70–99)
Glucose-Capillary: 225 mg/dL — ABNORMAL HIGH (ref 70–99)
Glucose-Capillary: 239 mg/dL — ABNORMAL HIGH (ref 70–99)
Glucose-Capillary: 272 mg/dL — ABNORMAL HIGH (ref 70–99)
Glucose-Capillary: 275 mg/dL — ABNORMAL HIGH (ref 70–99)
Glucose-Capillary: 314 mg/dL — ABNORMAL HIGH (ref 70–99)

## 2024-11-17 LAB — TROPONIN I (HIGH SENSITIVITY)
Troponin I (High Sensitivity): 394 ng/L (ref <18)
Troponin I (High Sensitivity): 286 ng/L (ref <18)
Troponin I (High Sensitivity): 239 ng/L (ref <18)
Troponin I (High Sensitivity): 173 ng/L (ref <18)
Troponin I (High Sensitivity): 162 ng/L (ref <18)

## 2024-11-17 LAB — MRSA NEXT GEN BY PCR, NASAL: MRSA by PCR Next Gen: NOT DETECTED

## 2024-11-17 LAB — HEPARIN LEVEL (UNFRACTIONATED)
Heparin Unfractionated: 0.38 [IU]/mL (ref 0.30–0.70)
Heparin Unfractionated: 0.28 [IU]/mL — ABNORMAL LOW (ref 0.30–0.70)

## 2024-11-17 LAB — HIV ANTIBODY (ROUTINE TESTING W REFLEX): HIV Screen 4th Generation wRfx: NONREACTIVE

## 2024-11-17 MED ORDER — INSULIN ASPART 100 UNIT/ML IJ SOLN
0.0000 [IU] | Freq: Every day | INTRAMUSCULAR | Status: DC
  Administered 2024-11-17 – 2024-11-18 (×2): 2 [IU] via SUBCUTANEOUS
  Filled 2024-11-17 (×2): qty 2

## 2024-11-17 MED ORDER — ALBUTEROL SULFATE HFA 108 (90 BASE) MCG/ACT IN AERS: 1.0000 | INHALATION_SPRAY | Freq: Once | RESPIRATORY_TRACT | Status: DC | PRN

## 2024-11-17 MED ORDER — INFLUENZA VIRUS VACC SPLIT PF (FLUZONE) 0.5 ML IM SUSY: 0.5000 mL | PREFILLED_SYRINGE | INTRAMUSCULAR | Status: DC

## 2024-11-17 MED ORDER — ASPIRIN 81 MG PO TBEC
81.0000 mg | DELAYED_RELEASE_TABLET | Freq: Every day | ORAL | Status: DC
  Administered 2024-11-17 – 2024-11-18 (×2): 81 mg via ORAL
  Filled 2024-11-17 (×2): qty 1

## 2024-11-17 MED ORDER — METOPROLOL SUCCINATE ER 25 MG PO TB24
25.0000 mg | ORAL_TABLET | Freq: Every day | ORAL | Status: DC
  Administered 2024-11-17 – 2024-11-19 (×3): 25 mg via ORAL
  Filled 2024-11-17 (×3): qty 1

## 2024-11-17 MED ORDER — SPIRONOLACTONE 25 MG PO TABS
50.0000 mg | ORAL_TABLET | Freq: Every day | ORAL | Status: DC
  Administered 2024-11-17 – 2024-11-19 (×3): 50 mg via ORAL
  Filled 2024-11-17 (×3): qty 2

## 2024-11-17 MED ORDER — FREE WATER
250.0000 mL | Freq: Once | Status: AC
  Administered 2024-11-18: 250 mL via ORAL

## 2024-11-17 MED ORDER — NICOTINE 14 MG/24HR TD PT24
14.0000 mg | MEDICATED_PATCH | Freq: Every day | TRANSDERMAL | Status: DC
  Administered 2024-11-17 – 2024-11-19 (×3): 14 mg via TRANSDERMAL
  Filled 2024-11-17 (×3): qty 1

## 2024-11-17 MED ORDER — PERFLUTREN LIPID MICROSPHERE
1.0000 mL | INTRAVENOUS | Status: AC | PRN
  Administered 2024-11-17: 3 mL via INTRAVENOUS

## 2024-11-17 MED ORDER — ALBUTEROL SULFATE (2.5 MG/3ML) 0.083% IN NEBU
2.5000 mg | INHALATION_SOLUTION | Freq: Once | RESPIRATORY_TRACT | Status: AC | PRN
  Administered 2024-11-17: 2.5 mg via RESPIRATORY_TRACT
  Filled 2024-11-17: qty 3

## 2024-11-17 MED ORDER — INSULIN ISOPHANE & REGULAR (HUMAN 70-30)100 UNIT/ML KWIKPEN: 25.0000 [IU] | PEN_INJECTOR | Freq: Two times a day (BID) | SUBCUTANEOUS | Status: DC

## 2024-11-17 MED ORDER — INSULIN ASPART PROT & ASPART (70-30 MIX) 100 UNIT/ML ~~LOC~~ SUSP
25.0000 [IU] | Freq: Two times a day (BID) | SUBCUTANEOUS | Status: DC
  Administered 2024-11-17 – 2024-11-18 (×2): 25 [IU] via SUBCUTANEOUS
  Filled 2024-11-17: qty 10

## 2024-11-17 MED ORDER — INSULIN ASPART 100 UNIT/ML IJ SOLN
0.0000 [IU] | Freq: Three times a day (TID) | INTRAMUSCULAR | Status: DC
  Administered 2024-11-17 – 2024-11-18 (×2): 11 [IU] via SUBCUTANEOUS
  Administered 2024-11-18 – 2024-11-19 (×3): 8 [IU] via SUBCUTANEOUS
  Administered 2024-11-19: 5 [IU] via SUBCUTANEOUS
  Filled 2024-11-17 (×2): qty 8
  Filled 2024-11-17: qty 11
  Filled 2024-11-17: qty 5
  Filled 2024-11-17: qty 8
  Filled 2024-11-17: qty 11

## 2024-11-17 NOTE — Progress Notes (Addendum)
 PROGRESS NOTE    Christopher Cross  FMW:[id number] DOB: 1966-11-28 DOA: 11/16/2024 PCP: Carlette Benita Area, MD   Brief Narrative:  Christopher Cross is a 58 y.o. male who presents with acute onset chest pain after eating dinner, smoking, drinking alcohol.  He has a known history of uncontrolled insulin -dependent of Diabetes due to noncompliance, hypertension, asthma, coronary artery disease with history of MI, heart failure with reduced ejection fraction.  He also notes his chest pain is bilateral radiating into both arms which is consistent with his prior MI.  Of note patient recently ran out of insulin , has not refilled and presented with DKA.   Assessment & Plan:   Principal Problem:   NSTEMI (non-ST elevated myocardial infarction) (HCC) Active Problems:   HTN (hypertension)   Tobacco use   DKA (diabetic ketoacidosis) (HCC)   Alcohol use   Chronic HFrEF (heart failure with reduced ejection fraction) (HCC)   LV (left ventricular) mural thrombus  NSTEMI -Cardiology following, appreciate insight recommendations - Troponin remains elevated, unclear if primary etiology is cardiac versus DKA - Heparin  drip ongoing, potential plan for cardiac cath in the next 24 to 48h   DKA and uncontrolled insulin -dependent type 2 diabetes -Anion gap now closed, tolerating p.o., transition back to home insulin  70/30 twice daily dosing(had been taking this once daily but had recently run out of insulin ) - Continue sliding scale, hypoglycemic protocol  Chronic alcohol use CIWA ongoing, discussed cessation at length Hypertension Blood pressure controlled Asthma/COPD, ongoing tobacco abuse -discussed cessation at length  DVT prophylaxis: Heparin  drip Code Status:   Code Status: Full Code Family Communication: None present  Status is: Inpatient  Dispo: The patient is from: Home              Anticipated d/c is to: Home              Anticipated d/c date is: 24 to 48 hours              Patient  currently not medically stable for discharge  Consultants:  Cardiology  Procedures:  None  Antimicrobials:  None  Subjective: No acute issues or events overnight, chest pain resolved denies nausea vomit diarrhea constipation any fevers chills or shortness of breath  Objective: Vitals:   11/17/24 0100 11/17/24 0200 11/17/24 0300 11/17/24 0607  BP: 104/80 115/76 (!) 124/93 91/68  Pulse: 92 89 95 85  Resp: 14 16 16 16   Temp:   98.1 F (36.7 C)   TempSrc:   Oral   SpO2: 94% 94% 96% 94%  Weight:      Height:        Intake/Output Summary (Last 24 hours) at 11/17/2024 0727 Last data filed at 11/17/2024 9364 Gross per 24 hour  Intake 1143.89 ml  Output 1000 ml  Net 143.89 ml   Filed Weights   11/16/24 1718 11/16/24 2342  Weight: 90.8 kg 90.1 kg    Examination:  General:  Pleasantly resting in bed, No acute distress. HEENT:  Normocephalic atraumatic.  Sclerae nonicteric, noninjected.  Extraocular movements intact bilaterally. Neck:  Without mass or deformity.  Trachea is midline. Lungs:  Clear to auscultate bilaterally without rhonchi, wheeze, or rales. Heart:  Regular rate and rhythm.  Without murmurs, rubs, or gallops. Abdomen:  Soft, nontender, nondistended.  Without guarding or rebound. Extremities: Without cyanosis, clubbing, edema, or obvious deformity. Skin:  Warm and dry, no erythema.  Data Reviewed: I have personally reviewed following labs and imaging studies  CBC: Recent  Labs  Lab 11/16/24 1752  WBC 13.8*  HGB 15.9  HCT 43.9  MCV 92.2  PLT 199   Basic Metabolic Panel: Recent Labs  Lab 11/16/24 1752 11/16/24 2205  NA 135 135  K 4.0 3.7  CL 97* 100  CO2 19* 23  GLUCOSE 364* 197*  BUN 13 11  CREATININE 0.81 0.71  CALCIUM  9.4 8.9   GFR: Estimated Creatinine Clearance: 114.5 mL/min (by C-G formula based on SCr of 0.71 mg/dL).  Coagulation Profile: Recent Labs  Lab 11/16/24 1752  INR 1.0   CBG: Recent Labs  Lab 11/17/24 0241  11/17/24 0324 11/17/24 0431 11/17/24 0536 11/17/24 0634  GLUCAP 100* 119* 151* 169* 165*   Lipid Profile: Recent Labs    11/17/24 0136  CHOL 146  HDL 55  LDLCALC 59  TRIG 161*  CHOLHDL 2.7   Recent Results (from the past 240 hours)  MRSA Next Gen by PCR, Nasal     Status: None   Collection Time: 11/16/24 11:38 PM   Specimen: Nasal Mucosa; Nasal Swab  Result Value Ref Range Status   MRSA by PCR Next Gen NOT DETECTED NOT DETECTED Final    Comment: (NOTE) The GeneXpert MRSA Assay (FDA approved for NASAL specimens only), is one component of a comprehensive MRSA colonization surveillance program. It is not intended to diagnose MRSA infection nor to guide or monitor treatment for MRSA infections. Test performance is not FDA approved in patients less than 10 years old. Performed at [facility], [address] [place], [place] [address]          Radiology Studies: CT Angio Chest/Abd/Pel for Dissection W and/or Wo Contrast Result Date: 11/16/2024 EXAM: CTA CHEST, ABDOMEN AND PELVIS WITHOUT AND WITH CONTRAST 11/16/2024 92:71:71 PM TECHNIQUE: CTA of the chest was performed without and with the administration of 100 mL of iohexol  (OMNIPAQUE ) 350 MG/ML injection. CTA of the abdomen and pelvis was performed without and with the administration of 100 mL of iohexol  (OMNIPAQUE ) 350 MG/ML injection. Multiplanar reformatted images are provided for review. MIP images are provided for review. Automated exposure control, iterative reconstruction, and/or weight based adjustment of the mA/kV was utilized to reduce the radiation dose to as low as reasonably achievable. COMPARISON: Same day x-ray. CTA chest 11/10/2020. CT abdomen and pelvis 07/22/2020. CLINICAL HISTORY: Aortic aneurysm suspected. FINDINGS: VASCULATURE: AORTA: Aortic atherosclerotic calcification. No acute aortic syndrome. No abdominal aortic aneurysm. No dissection. PULMONARY ARTERIES: Negative for pulmonary embolism. GREAT  VESSELS OF AORTIC ARCH: No acute finding. No dissection. No arterial occlusion or significant stenosis. CELIAC TRUNK: No acute finding. No occlusion or significant stenosis. SUPERIOR MESENTERIC ARTERY: No acute finding. No occlusion or significant stenosis. INFERIOR MESENTERIC ARTERY: No acute finding. No occlusion or significant stenosis. RENAL ARTERIES: No acute finding. No occlusion or significant stenosis. ILIAC ARTERIES: Low-density atherosclerotic plaque in the right common iliac artery causes mild narrowing. No acute finding. No occlusion or significant stenosis. CHEST: MEDIASTINUM: Coronary artery and aortic atherosclerotic calcification. No mediastinal lymphadenopathy. The heart and pericardium demonstrate no acute abnormality. LUNGS AND PLEURA: Mild centrilobular and paraseptal emphysema. Clustered centrilobular micronodules in the left lower lobe compatible with mild small airway infection / inflammation. No focal consolidation or pulmonary edema. No evidence of pleural effusion or pneumothorax. THORACIC BONES AND SOFT TISSUES: No acute bone or soft tissue abnormality. ABDOMEN AND PELVIS: LIVER: Hepatic steatosis. GALLBLADDER AND BILE DUCTS: Gallbladder is unremarkable. No biliary ductal dilatation. SPLEEN: The spleen is unremarkable. PANCREAS: The pancreas is unremarkable. ADRENAL  GLANDS: Bilateral adrenal glands demonstrate no acute abnormality. KIDNEYS, URETERS AND BLADDER: 9 mm stone in the lower pole of the right kidney. No hydronephrosis or obstructive calculi. No perinephric or periureteral stranding. Urinary bladder is unremarkable. GI AND BOWEL: Stomach and duodenal sweep demonstrate no acute abnormality. Normal appendix. There is no bowel obstruction. No abnormal bowel wall thickening or distension. REPRODUCTIVE: Reproductive organs are unremarkable. PERITONEUM AND RETROPERITONEUM: No ascites or free air. LYMPH NODES: No lymphadenopathy. ABDOMINAL BONES AND SOFT TISSUES: Fat-containing right  inguinal hernia. No acute abnormality of the bones. No acute soft tissue abnormality. IMPRESSION: 1. No acute aortic syndrome. 2. Mild centrilobular and paraseptal emphysema with clustered centrilobular micronodules in the left lower lobe compatible with mild small airway infection/inflammation. 3. 9 mm nonobstructing right lower pole renal calculus. 4. Hepatic steatosis. Electronically signed by: Norman Gatlin MD 11/16/2024 07:38 PM EST RP Workstation: [id number]   DG Chest Port 1 View Result Date: 11/16/2024 EXAM: 1 VIEW(S) XRAY OF THE CHEST 11/16/2024 06:13:20 PM COMPARISON: 08/24/2023 CLINICAL HISTORY: Chest pain FINDINGS: LUNGS AND PLEURA: No focal pulmonary opacity. No pleural effusion. No pneumothorax. HEART AND MEDIASTINUM: No acute abnormality of the cardiac and mediastinal silhouettes. BONES AND SOFT TISSUES: No acute osseous abnormality. IMPRESSION: 1. No acute cardiopulmonary pathology. Electronically signed by: Norman Gatlin MD 11/16/2024 06:31 PM EST RP Workstation: [id number]        Scheduled Meds:  aspirin  EC  81 mg Oral Daily   atorvastatin   40 mg Oral QPM   folic acid   1 mg Oral Daily   [START ON 11/18/2024] influenza vac split trivalent PF  0.5 mL Intramuscular Tomorrow-1000   LORazepam   0-4 mg Oral Q6H   Followed by   NOREEN ON 11/19/2024] LORazepam   0-4 mg Oral Q12H   multivitamin with minerals  1 tablet Oral Daily   nicotine   21 mg Transdermal Once   spironolactone   50 mg Oral Daily   thiamine   100 mg Oral Daily   Or   thiamine   100 mg Intravenous Daily   Continuous Infusions:  dextrose 5% lactated ringers 125 mL/hr at 11/17/24 0627   heparin  1,250 Units/hr (11/17/24 0600)   insulin  5.5 Units/hr (11/17/24 0635)   lactated ringers       LOS: 1 day   Time spent:  Elsie JAYSON Montclair, DO [facility]  If 7PM-7AM, please contact night-coverage [email or web address]  11/17/2024, 7:27 AM

## 2024-11-17 NOTE — Consult Note (Signed)
 Cardiology Note   Patient ID: Christopher Cross MRN: [id number]; DOB: July 06, 1966  Admit date: 11/16/2024 Date of Consult: 11/17/2024  PCP:  Carlette Benita Area, MD   [facility] Providers Cardiologist:  Alvan Carrier, MD        Patient Profile: Christopher Cross is a 58 y.o. male with a hx of T2DM, HTN, asthma/COPD, non-ischemic HFrEF (LVEF30-35%), history of LV thrombus 2021, tobacco use, and history of EtOH use who is being seen 11/17/2024 for the evaluation of chest pain.  High-sensitivity troponin peaked at 394 and has trended down to 286  Denies any further chest pain since admission. Past Medical History:  Diagnosis Date   Asthma    Diabetes mellitus without complication (HCC)    Other emphysema (HCC)    Unspecified essential hypertension     Past Surgical History:  Procedure Laterality Date   RIGHT/LEFT HEART CATH AND CORONARY ANGIOGRAPHY N/A 11/13/2020   Procedure: RIGHT/LEFT HEART CATH AND CORONARY ANGIOGRAPHY;  Surgeon: Dann Candyce RAMAN, MD;  Location: [facility] INVASIVE CV LAB;  Service: Cardiovascular;  Laterality: N/A;     Home Medications:  Prior to Admission medications   Medication Sig Start Date End Date Taking? Authorizing Provider  atorvastatin  (LIPITOR) 40 MG tablet Take 1 tablet (40 mg total) by mouth every evening. 11/15/20  Yes Regalado, Belkys A, MD  EPINEPHrine (PRIMATENE MIST IN) Inhale 2 puffs into the lungs daily as needed (shortness of breath).   Yes [provider]  insulin  isophane & regular human (NOVOLIN  70/30 FLEXPEN RELION) (70-30) 100 UNIT/ML KwikPen Inject 40 Units into the skin 2 (two) times daily before a meal. Patient taking differently: Inject 50 Units into the skin daily. 11/15/20  Yes Regalado, Belkys A, MD  losartan  (COZAAR ) 100 MG tablet TAKE 1 TABLET BY MOUTH ONCE DAILY . APPOINTMENT REQUIRED FOR FUTURE REFILLS 05/20/22  Yes Branch, Carrier FALCON, MD  metFORMIN  (GLUCOPHAGE ) 1000 MG tablet Take 1 tablet (1,000 mg total)  2 (two) times daily by mouth. Patient taking differently: Take 1,000 mg by mouth 2 (two) times daily with a meal. 10/22/17  Yes Lynwood Anes, MD  metoprolol  succinate (TOPROL -XL) 50 MG 24 hr tablet TAKE 1 TABLET BY MOUTH ONCE DAILY . APPOINTMENT REQUIRED FOR FUTURE REFILLS 05/20/22  Yes Branch, Carrier FALCON, MD  nitroGLYCERIN  (NITROSTAT ) 0.4 MG SL tablet Place 1 tablet (0.4 mg total) under the tongue every 5 (five) minutes as needed for chest pain. 11/15/20  Yes Regalado, Belkys A, MD  spironolactone  (ALDACTONE ) 50 MG tablet Take 50 mg by mouth every morning. 11/10/24  Yes [provider]  blood glucose meter kit and supplies Dispense based on patient and insurance preference. Use up to four times daily as directed. (FOR ICD-10 E10.9, E11.9). 11/15/20   Regalado, Owen A, MD  Insulin  Pen Needle (PEN NEEDLES 3/16) 31G X 5 MM MISC 1 application by Does not apply route 2 (two) times daily. 11/15/20   Regalado, Owen LABOR, MD    Scheduled Meds:  aspirin  EC  81 mg Oral Daily   atorvastatin   40 mg Oral QPM   folic acid   1 mg Oral Daily   [START ON 11/18/2024] influenza vac split trivalent PF  0.5 mL Intramuscular Tomorrow-1000   insulin  isophane & regular human KwikPen  25 Units Subcutaneous BID AC   LORazepam   0-4 mg Oral Q6H   Followed by   NOREEN ON 11/19/2024] LORazepam   0-4 mg Oral Q12H   multivitamin with minerals  1 tablet Oral Daily  nicotine   21 mg Transdermal Once   spironolactone   50 mg Oral Daily   thiamine   100 mg Oral Daily   Or   thiamine   100 mg Intravenous Daily   Continuous Infusions:  dextrose 5% lactated ringers 125 mL/hr at 11/17/24 0627   heparin  1,250 Units/hr (11/17/24 0600)   insulin  5.5 Units/hr (11/17/24 0635)   lactated ringers     PRN Meds: dextrose, LORazepam  **OR** LORazepam , nitroGLYCERIN   Allergies:    Allergies  Allergen Reactions   Xarelto  [Rivaroxaban ] Other (See Comments)    hematuria    Social History:   Social History   Socioeconomic  History   Marital status: Legally Separated    Spouse name: Not on file   Number of children: Not on file   Years of education: Not on file   Highest education level: Not on file  Occupational History   Not on file  Tobacco Use   Smoking status: Every Day    Current packs/day: 2.00    Average packs/day: 2.0 packs/day for 8.9 years (17.8 ttl pk-yrs)    Types: Cigarettes    Start date: 12/21/2015   Smokeless tobacco: Never   Tobacco comments:    started back after about 15 days   Vaping Use   Vaping status: Never Used  Substance and Sexual Activity   Alcohol use: Yes    Alcohol/week: 0.0 standard drinks of alcohol    Comment: daily   Drug use: No   Sexual activity: Not on file  Other Topics Concern   Not on file  Social History Narrative   Not on file   Social Drivers of Health   Financial Resource Strain: Not on file  Food Insecurity: No Food Insecurity (11/17/2024)   Hunger Vital Sign    Worried About Running Out of Food in the Last Year: Never true    Ran Out of Food in the Last Year: Never true  Transportation Needs: No Transportation Needs (11/17/2024)   PRAPARE - Administrator, Civil Service (Medical): No    Lack of Transportation (Non-Medical): No  Physical Activity: Not on file  Stress: Not on file  Social Connections: Not on file  Intimate Partner Violence: Unknown (11/17/2024)   Humiliation, Afraid, Rape, and Kick questionnaire    Fear of Current or Ex-Partner: No    Emotionally Abused: No    Physically Abused: Not on file    Sexually Abused: Not on file    Family History:    Family History  Problem Relation Age of Onset   Heart attack Other        Grandfather   Heart attack Other        Uncle     ROS:  Please see the history of present illness.   All other ROS reviewed and negative.     Physical Exam/Data: Vitals:   11/17/24 0200 11/17/24 0300 11/17/24 0607 11/17/24 0728  BP: 115/76 (!) 124/93 91/68 (!) 117/92  Pulse: 89 95 85  95  Resp: 16 16 16 13   Temp:  98.1 F (36.7 C)  98.1 F (36.7 C)  TempSrc:  Oral  Oral  SpO2: 94% 96% 94% 95%  Weight:      Height:        Intake/Output Summary (Last 24 hours) at 11/17/2024 0800 Last data filed at 11/17/2024 9364 Gross per 24 hour  Intake 1143.89 ml  Output 1000 ml  Net 143.89 ml      11/16/2024   11:42  PM 11/16/2024    5:18 PM 06/28/2024    1:17 PM  Last 3 Weights  Weight (lbs) 198 lb 10.2 oz 200 lb 1.6 oz 220 lb  Weight (kg) 90.1 kg 90.765 kg 99.791 kg     Body mass index is 29.33 kg/m.  GEN: Well nourished, well developed in no acute distress HEENT: Normal NECK: No JVD; No carotid bruits LYMPHATICS: No lymphadenopathy CARDIAC:RRR, no murmurs, rubs, gallops RESPIRATORY:  Clear to auscultation without rales, wheezing or rhonchi  ABDOMEN: Soft, non-tender, non-distended MUSCULOSKELETAL:  No edema; No deformity  SKIN: Warm and dry NEUROLOGIC:  Alert and oriented x 3 PSYCHIATRIC:  Normal affect    Relevant CV Studies: [facility] 10/2020: LV end diastolic pressure is mildly elevated. LVEDP 22 mm Hg. There is no aortic valve stenosis. No angiographically apparent CAD. Ao 98%, PA sat 77%, PA pressure 33/19, mean PA 26 mm Hg; PCWP 19 mm Hg; CO 6.7 L/min; CI 3.2 Hemodynamic findings consistent with mild pulmonary hypertension.  TTE 10/2020:  1. Left ventricular ejection fraction, by estimation, is 30 to 35%. The  left ventricle has moderately decreased function. The left ventricle  demonstrates regional wall motion abnormalities (see scoring  diagram/findings for description). Suggestive of  possible stress induced cardiomyopathy although ischemic cardiomyopathy is  not excluded. There is mild left ventricular hypertrophy. Left ventricular  diastolic parameters are indeterminate. Definity  contrast utilzed with  evidence of apical LV thrombus,  some of which is loosely organized.   2. Right ventricular systolic function is normal. The right ventricular   size is normal. Tricuspid regurgitation signal is inadequate for assessing  PA pressure.   3. The mitral valve is grossly normal. Trivial mitral valve  regurgitation.   4. The aortic valve is tricuspid. Aortic valve regurgitation is not  visualized.   5. The inferior vena cava is normal in size with greater than 50%  respiratory variability, suggesting right atrial pressure of 3 mmHg.   Laboratory Data: High Sensitivity Troponin:   Recent Labs  Lab 11/17/24 0136 11/17/24 0502  TROPONINIHS 394* 286*     Chemistry Recent Labs  Lab 11/16/24 1752 11/16/24 2205  NA 135 135  K 4.0 3.7  CL 97* 100  CO2 19* 23  GLUCOSE 364* 197*  BUN 13 11  CREATININE 0.81 0.71  CALCIUM  9.4 8.9  GFRNONAA >60 >60  ANIONGAP 18* 12    No results for input(s): PROT, ALBUMIN, AST, ALT, ALKPHOS, BILITOT in the last 168 hours. Lipids  Recent Labs  Lab 11/17/24 0136  CHOL 146  TRIG 161*  HDL 55  LDLCALC 59  CHOLHDL 2.7    Hematology Recent Labs  Lab 11/16/24 1752  WBC 13.8*  RBC 4.76  HGB 15.9  HCT 43.9  MCV 92.2  MCH 33.4  MCHC 36.2*  RDW 11.4*  PLT 199   Thyroid No results for input(s): TSH, FREET4 in the last 168 hours.  BNPNo results for input(s): BNP, PROBNP in the last 168 hours.  DDimer No results for input(s): DDIMER in the last 168 hours.  Radiology/Studies:  CT Angio Chest/Abd/Pel for Dissection W and/or Wo Contrast Result Date: 11/16/2024 EXAM: CTA CHEST, ABDOMEN AND PELVIS WITHOUT AND WITH CONTRAST 11/16/2024 07:28:28 PM TECHNIQUE: CTA of the chest was performed without and with the administration of 100 mL of iohexol  (OMNIPAQUE ) 350 MG/ML injection. CTA of the abdomen and pelvis was performed without and with the administration of 100 mL of iohexol  (OMNIPAQUE ) 350 MG/ML injection. Multiplanar reformatted images are provided for  review. MIP images are provided for review. Automated exposure control, iterative reconstruction, and/or weight based  adjustment of the mA/kV was utilized to reduce the radiation dose to as low as reasonably achievable. COMPARISON: Same day x-ray. CTA chest 11/10/2020. CT abdomen and pelvis 07/22/2020. CLINICAL HISTORY: Aortic aneurysm suspected. FINDINGS: VASCULATURE: AORTA: Aortic atherosclerotic calcification. No acute aortic syndrome. No abdominal aortic aneurysm. No dissection. PULMONARY ARTERIES: Negative for pulmonary embolism. GREAT VESSELS OF AORTIC ARCH: No acute finding. No dissection. No arterial occlusion or significant stenosis. CELIAC TRUNK: No acute finding. No occlusion or significant stenosis. SUPERIOR MESENTERIC ARTERY: No acute finding. No occlusion or significant stenosis. INFERIOR MESENTERIC ARTERY: No acute finding. No occlusion or significant stenosis. RENAL ARTERIES: No acute finding. No occlusion or significant stenosis. ILIAC ARTERIES: Low-density atherosclerotic plaque in the right common iliac artery causes mild narrowing. No acute finding. No occlusion or significant stenosis. CHEST: MEDIASTINUM: Coronary artery and aortic atherosclerotic calcification. No mediastinal lymphadenopathy. The heart and pericardium demonstrate no acute abnormality. LUNGS AND PLEURA: Mild centrilobular and paraseptal emphysema. Clustered centrilobular micronodules in the left lower lobe compatible with mild small airway infection / inflammation. No focal consolidation or pulmonary edema. No evidence of pleural effusion or pneumothorax. THORACIC BONES AND SOFT TISSUES: No acute bone or soft tissue abnormality. ABDOMEN AND PELVIS: LIVER: Hepatic steatosis. GALLBLADDER AND BILE DUCTS: Gallbladder is unremarkable. No biliary ductal dilatation. SPLEEN: The spleen is unremarkable. PANCREAS: The pancreas is unremarkable. ADRENAL GLANDS: Bilateral adrenal glands demonstrate no acute abnormality. KIDNEYS, URETERS AND BLADDER: 9 mm stone in the lower pole of the right kidney. No hydronephrosis or obstructive calculi. No perinephric or  periureteral stranding. Urinary bladder is unremarkable. GI AND BOWEL: Stomach and duodenal sweep demonstrate no acute abnormality. Normal appendix. There is no bowel obstruction. No abnormal bowel wall thickening or distension. REPRODUCTIVE: Reproductive organs are unremarkable. PERITONEUM AND RETROPERITONEUM: No ascites or free air. LYMPH NODES: No lymphadenopathy. ABDOMINAL BONES AND SOFT TISSUES: Fat-containing right inguinal hernia. No acute abnormality of the bones. No acute soft tissue abnormality. IMPRESSION: 1. No acute aortic syndrome. 2. Mild centrilobular and paraseptal emphysema with clustered centrilobular micronodules in the left lower lobe compatible with mild small airway infection/inflammation. 3. 9 mm nonobstructing right lower pole renal calculus. 4. Hepatic steatosis. Electronically signed by: Norman Gatlin MD 11/16/2024 07:38 PM EST RP Workstation: [id number]   DG Chest Port 1 View Result Date: 11/16/2024 EXAM: 1 VIEW(S) XRAY OF THE CHEST 11/16/2024 06:13:20 PM COMPARISON: 08/24/2023 CLINICAL HISTORY: Chest pain FINDINGS: LUNGS AND PLEURA: No focal pulmonary opacity. No pleural effusion. No pneumothorax. HEART AND MEDIASTINUM: No acute abnormality of the cardiac and mediastinal silhouettes. BONES AND SOFT TISSUES: No acute osseous abnormality. IMPRESSION: 1. No acute cardiopulmonary pathology. Electronically signed by: Norman Gatlin MD 11/16/2024 06:31 PM EST RP Workstation: [id number]     Assessment and Plan: NSTEMI Presents with an episode of sharp, retrosternal chest pain with radiation to the bilateral upper extremities and shortness of breath.  Symptoms lasted for about 15 minutes and resolved with SL NG. Mildly elevated but relatively flat troponin trend (troponin 111 =>131 =>122=> 394=> 286).  EKG with no acute ischemic changes.  His symptoms are concerning for a cardiac event. He is now being treated for mild DKA which possibly could have resulted in elevated troponin trend  from demand ischemia but given his overall symptoms and resolution with nitroglycerin  need to assume this is type I NSTEMI until further evaluation.   - Continue aspirin  81 mg daily, IV heparin  drip, atorvastatin   40 mg daily - Add Toprol  XL 25 mg daily - Will check a 2D echo today to reassess LV function- TTE in the AM - Lipids this admission showed an LDL 59, HDL 55 and triglycerides 161  - HbA1c pending - Recommend left heart cath to redefine coronary anatomy - Make n.p.o. after midnight for left heart cath with possible PCI in the a.m. - Informed Consent   Shared Decision Making/Informed Consent The risks [stroke (1 in 1000), death (1 in 1000), kidney failure [usually temporary] (1 in 500), bleeding (1 in 200), allergic reaction [possibly serious] (1 in 200)], benefits (diagnostic support and management of coronary artery disease) and alternatives of a cardiac catheterization were discussed in detail with Mr. Littler and he is willing to proceed.    HFrEF Last 2D echo 11/11/2020 showed EF 30 to 35% with possible stress cardiomyopathy given mid inferoseptal, mid anterolateral and apical akinesis as well as mid anterior/inferior hypokinesis with normal basal segments euvolemic with no signs of HF. - Repeat 2D echo to reassess LV function - PTA he was on GDMT with losartan  and Lopressor  --he states that he had been compliant with his medication but it appears that both of these meds expired in 2024 with no subsequent refills. - He had been taking his spironolactone  which we will continue at 50 mg daily - Hold ARB for cath and if LV function still remains reduced we will transition to Entresto  - Start Toprol  XL 25 mg daily  History of LV thrombus - Previously on Xarelto  but stopped it due to hematuria; unclear when but patient believes it was stopped ~2 years ago. No recent bleeding issues - Continue heparin  drip for now with 2D echo pending  Hypertension - BP elevated at 120/102 mmHg this  morning - He had been on losartan  and Lopressor  at home but these have not been refilled since 2024 even though he says he has been taking them - Will start Toprol  XL 25 mg daily and continue spironolactone  50 mg daily - Restart ARB/ARNI pending results of 2D echo  I spent 35 minutes caring for this patient today face to face, ordering and reviewing labs, reviewing records from 2D echo from 2021 and cardiac cath from 2121, seeing the patient, documenting in the record, and arranging for a left heart catheterization with possible PCI   For questions or updates, please contact [facility] Please consult [email or web address] for contact info under   Signed, Wilbert Bihari, MD  11/17/2024 8:00 AM

## 2024-11-17 NOTE — Progress Notes (Addendum)
 Pharmacy Consult Note - Anticoagulation  Pharmacy Consult for Heparin  drip Indication: chest pain/ACS Allergies  Allergen Reactions   Xarelto  [Rivaroxaban ] Other (See Comments)    hematuria    PATIENT MEASUREMENTS: Height: 5' 9 (175.3 cm) Weight: 90.1 kg (198 lb 10.2 oz) IBW/kg (Calculated) : 70.7 HEPARIN  DW (KG): 89.1  VITAL SIGNS: Temp: 98.1 F (36.7 C) (11/30 0728) Temp Source: Oral (11/30 0728) BP: 105/82 (11/30 0947) Pulse Rate: 99 (11/30 0947)  Recent Labs    11/16/24 1752 11/16/24 2205 11/17/24 0136 11/17/24 0954 11/17/24 0957  HGB 15.9  --   --   --   --   HCT 43.9  --   --   --   --   PLT 199  --   --   --   --   APTT 30  --   --   --   --   LABPROT 13.3  --   --   --   --   INR 1.0  --   --   --   --   HEPARINUNFRC  --  >1.10*   < >  --  0.28*  CREATININE 0.81 0.71  --   --   --   TROPONINIHS  --   --    < > 239*  --    < > = values in this interval not displayed.    Estimated Creatinine Clearance: 114.5 mL/min (by C-G formula based on SCr of 0.71 mg/dL).  PAST MEDICAL HISTORY: Past Medical History:  Diagnosis Date   Asthma    Diabetes mellitus without complication (HCC)    Other emphysema (HCC)    Unspecified essential hypertension     Medications:  Medications Prior to Admission  Medication Sig Dispense Refill Last Dose/Taking   atorvastatin  (LIPITOR) 40 MG tablet Take 1 tablet (40 mg total) by mouth every evening. 30 tablet 3 11/16/2024 Evening   EPINEPHrine (PRIMATENE MIST IN) Inhale 2 puffs into the lungs daily as needed (shortness of breath).   11/16/2024 Evening   insulin  isophane & regular human (NOVOLIN  70/30 FLEXPEN RELION) (70-30) 100 UNIT/ML KwikPen Inject 40 Units into the skin 2 (two) times daily before a meal. (Patient taking differently: Inject 50 Units into the skin daily.) 15 mL 11 11/16/2024 Morning   losartan  (COZAAR ) 100 MG tablet TAKE 1 TABLET BY MOUTH ONCE DAILY . APPOINTMENT REQUIRED FOR FUTURE REFILLS 5 tablet 0 11/16/2024  Evening   metFORMIN  (GLUCOPHAGE ) 1000 MG tablet Take 1 tablet (1,000 mg total) 2 (two) times daily by mouth. (Patient taking differently: Take 1,000 mg by mouth 2 (two) times daily with a meal.) 30 tablet 0 11/14/2024   metoprolol  succinate (TOPROL -XL) 50 MG 24 hr tablet TAKE 1 TABLET BY MOUTH ONCE DAILY . APPOINTMENT REQUIRED FOR FUTURE REFILLS 5 tablet 0 11/16/2024 Evening   nitroGLYCERIN  (NITROSTAT ) 0.4 MG SL tablet Place 1 tablet (0.4 mg total) under the tongue every 5 (five) minutes as needed for chest pain. 30 tablet 3 Unknown   spironolactone  (ALDACTONE ) 50 MG tablet Take 50 mg by mouth every morning.   11/16/2024 Evening   blood glucose meter kit and supplies Dispense based on patient and insurance preference. Use up to four times daily as directed. (FOR ICD-10 E10.9, E11.9). 1 each 0    Insulin  Pen Needle (PEN NEEDLES 3/16) 31G X 5 MM MISC 1 application by Does not apply route 2 (two) times daily. 30 each 3    Scheduled:   aspirin  EC  81  mg Oral Daily   atorvastatin   40 mg Oral QPM   folic acid   1 mg Oral Daily   [START ON 11/18/2024] influenza vac split trivalent PF  0.5 mL Intramuscular Tomorrow-1000   insulin  aspart protamine- aspart  25 Units Subcutaneous BID WC   LORazepam   0-4 mg Oral Q6H   Followed by   NOREEN ON 11/19/2024] LORazepam   0-4 mg Oral Q12H   metoprolol  succinate  25 mg Oral Daily   multivitamin with minerals  1 tablet Oral Daily   nicotine   21 mg Transdermal Once   spironolactone   50 mg Oral Daily   thiamine   100 mg Oral Daily   Or   thiamine   100 mg Intravenous Daily   Infusions:   dextrose  5% lactated ringers  125 mL/hr at 11/17/24 9372   heparin  1,250 Units/hr (11/17/24 0600)   insulin  15 Units/hr (11/17/24 1048)   lactated ringers      PRN: dextrose , LORazepam  **OR** LORazepam , nitroGLYCERIN , perflutren  lipid microspheres (DEFINITY ) IV suspension  ASSESSMENT: 58 y.o. male with PMH CHF, COPD, nonischemic cardiomyopathy and T2DM is presenting with chest  pain. Patient is not on chronic anticoagulation per chart review. Of note patient was previous on Xarelto  but has noted side effect of hematuria. No records of patient picking up Xarelto  in the past 6 months per dispense history. Pharmacy has been consulted to manage heparin  intravenous infusion.  Heparin  level came back slightly subtherapeutic at 0.28, on heparin  infusion at 1250 units/hr. No s/sx of bleeding or infusion issues. Trop 713>760. Of note, ECHO today saying cannot rule out early forming mural apical LV thrombus.   Goal(s) of therapy: Heparin  level 0.3 - 0.7 units/mL Monitor platelets by anticoagulation protocol: Yes  PLAN: Increase heparin  infusion to 1350 units/hour to get into goal range Monitor daily HL, CBC, for s/sx of bleeding   Thank you for allowing pharmacy to participate in this patient's care,  Suzen Sour, PharmD, BCCCP Clinical Pharmacist  Phone: [phone] 11/17/2024 11:12 AM  Please check AMION for all [facility] phone numbers After 10:00 PM, call Main Pharmacy [phone]

## 2024-11-17 NOTE — H&P (View-Only) (Signed)
 Cardiology Note   Patient ID: Christopher Cross MRN: [id number]; DOB: 04-Mar-1966  Admit date: 11/16/2024 Date of Consult: 11/17/2024  PCP:  Christopher Benita Area, MD   [facility] Providers Cardiologist:  Alvan Carrier, MD        Patient Profile: Christopher Cross is a 58 y.o. male with a hx of T2DM, HTN, asthma/COPD, non-ischemic HFrEF (LVEF30-35%), history of LV thrombus 2021, tobacco use, and history of EtOH use who is being seen 11/17/2024 for the evaluation of chest pain.  High-sensitivity troponin peaked at 394 and has trended down to 286  Denies any further chest pain since admission. Past Medical History:  Diagnosis Date   Asthma    Diabetes mellitus without complication (HCC)    Other emphysema (HCC)    Unspecified essential hypertension     Past Surgical History:  Procedure Laterality Date   RIGHT/LEFT HEART CATH AND CORONARY ANGIOGRAPHY N/A 11/13/2020   Procedure: RIGHT/LEFT HEART CATH AND CORONARY ANGIOGRAPHY;  Surgeon: Dann Candyce RAMAN, MD;  Location: [facility] INVASIVE CV LAB;  Service: Cardiovascular;  Laterality: N/A;     Home Medications:  Prior to Admission medications   Medication Sig Start Date End Date Taking? Authorizing Provider  atorvastatin  (LIPITOR) 40 MG tablet Take 1 tablet (40 mg total) by mouth every evening. 11/15/20  Yes Regalado, Belkys A, MD  EPINEPHrine (PRIMATENE MIST IN) Inhale 2 puffs into the lungs daily as needed (shortness of breath).   Yes [provider]  insulin  isophane & regular human (NOVOLIN  70/30 FLEXPEN RELION) (70-30) 100 UNIT/ML KwikPen Inject 40 Units into the skin 2 (two) times daily before a meal. Patient taking differently: Inject 50 Units into the skin daily. 11/15/20  Yes Regalado, Belkys A, MD  losartan  (COZAAR ) 100 MG tablet TAKE 1 TABLET BY MOUTH ONCE DAILY . APPOINTMENT REQUIRED FOR FUTURE REFILLS 05/20/22  Yes Branch, Carrier FALCON, MD  metFORMIN  (GLUCOPHAGE ) 1000 MG tablet Take 1 tablet (1,000 mg total)  2 (two) times daily by mouth. Patient taking differently: Take 1,000 mg by mouth 2 (two) times daily with a meal. 10/22/17  Yes Lynwood Anes, MD  metoprolol  succinate (TOPROL -XL) 50 MG 24 hr tablet TAKE 1 TABLET BY MOUTH ONCE DAILY . APPOINTMENT REQUIRED FOR FUTURE REFILLS 05/20/22  Yes Branch, Carrier FALCON, MD  nitroGLYCERIN  (NITROSTAT ) 0.4 MG SL tablet Place 1 tablet (0.4 mg total) under the tongue every 5 (five) minutes as needed for chest pain. 11/15/20  Yes Regalado, Belkys A, MD  spironolactone  (ALDACTONE ) 50 MG tablet Take 50 mg by mouth every morning. 11/10/24  Yes [provider]  blood glucose meter kit and supplies Dispense based on patient and insurance preference. Use up to four times daily as directed. (FOR ICD-10 E10.9, E11.9). 11/15/20   Regalado, Owen A, MD  Insulin  Pen Needle (PEN NEEDLES 3/16) 31G X 5 MM MISC 1 application by Does not apply route 2 (two) times daily. 11/15/20   Regalado, Owen A, MD    Scheduled Meds:  aspirin  EC  81 mg Oral Daily   atorvastatin   40 mg Oral QPM   folic acid   1 mg Oral Daily   [START ON 11/18/2024] influenza vac split trivalent PF  0.5 mL Intramuscular Tomorrow-1000   insulin  isophane & regular human KwikPen  25 Units Subcutaneous BID AC   LORazepam   0-4 mg Oral Q6H   Followed by   NOREEN ON 11/19/2024] LORazepam   0-4 mg Oral Q12H   multivitamin with minerals  1 tablet Oral Daily  nicotine   21 mg Transdermal Once   spironolactone   50 mg Oral Daily   thiamine   100 mg Oral Daily   Or   thiamine   100 mg Intravenous Daily   Continuous Infusions:  dextrose 5% lactated ringers 125 mL/hr at 11/17/24 0627   heparin  1,250 Units/hr (11/17/24 0600)   insulin  5.5 Units/hr (11/17/24 0635)   lactated ringers     PRN Meds: dextrose, LORazepam  **OR** LORazepam , nitroGLYCERIN   Allergies:    Allergies  Allergen Reactions   Xarelto  [Rivaroxaban ] Other (See Comments)    hematuria    Social History:   Social History   Socioeconomic  History   Marital status: Legally Separated    Spouse name: Not on file   Number of children: Not on file   Years of education: Not on file   Highest education level: Not on file  Occupational History   Not on file  Tobacco Use   Smoking status: Every Day    Current packs/day: 2.00    Average packs/day: 2.0 packs/day for 8.9 years (17.8 ttl pk-yrs)    Types: Cigarettes    Start date: 12/21/2015   Smokeless tobacco: Never   Tobacco comments:    started back after about 15 days   Vaping Use   Vaping status: Never Used  Substance and Sexual Activity   Alcohol use: Yes    Alcohol/week: 0.0 standard drinks of alcohol    Comment: daily   Drug use: No   Sexual activity: Not on file  Other Topics Concern   Not on file  Social History Narrative   Not on file   Social Drivers of Health   Financial Resource Strain: Not on file  Food Insecurity: No Food Insecurity (11/17/2024)   Hunger Vital Sign    Worried About Running Out of Food in the Last Year: Never true    Ran Out of Food in the Last Year: Never true  Transportation Needs: No Transportation Needs (11/17/2024)   PRAPARE - Administrator, Civil Service (Medical): No    Lack of Transportation (Non-Medical): No  Physical Activity: Not on file  Stress: Not on file  Social Connections: Not on file  Intimate Partner Violence: Unknown (11/17/2024)   Humiliation, Afraid, Rape, and Kick questionnaire    Fear of Current or Ex-Partner: No    Emotionally Abused: No    Physically Abused: Not on file    Sexually Abused: Not on file    Family History:    Family History  Problem Relation Age of Onset   Heart attack Other        Grandfather   Heart attack Other        Uncle     ROS:  Please see the history of present illness.   All other ROS reviewed and negative.     Physical Exam/Data: Vitals:   11/17/24 0200 11/17/24 0300 11/17/24 0607 11/17/24 0728  BP: 115/76 (!) 124/93 91/68 (!) 117/92  Pulse: 89 95 85  95  Resp: 16 16 16 13   Temp:  98.1 F (36.7 C)  98.1 F (36.7 C)  TempSrc:  Oral  Oral  SpO2: 94% 96% 94% 95%  Weight:      Height:        Intake/Output Summary (Last 24 hours) at 11/17/2024 0800 Last data filed at 11/17/2024 9364 Gross per 24 hour  Intake 1143.89 ml  Output 1000 ml  Net 143.89 ml      11/16/2024   11:42  PM 11/16/2024    5:18 PM 06/28/2024    1:17 PM  Last 3 Weights  Weight (lbs) 198 lb 10.2 oz 200 lb 1.6 oz 220 lb  Weight (kg) 90.1 kg 90.765 kg 99.791 kg     Body mass index is 29.33 kg/m.  GEN: Well nourished, well developed in no acute distress HEENT: Normal NECK: No JVD; No carotid bruits LYMPHATICS: No lymphadenopathy CARDIAC:RRR, no murmurs, rubs, gallops RESPIRATORY:  Clear to auscultation without rales, wheezing or rhonchi  ABDOMEN: Soft, non-tender, non-distended MUSCULOSKELETAL:  No edema; No deformity  SKIN: Warm and dry NEUROLOGIC:  Alert and oriented x 3 PSYCHIATRIC:  Normal affect    Relevant CV Studies: [facility] 10/2020: LV end diastolic pressure is mildly elevated. LVEDP 22 mm Hg. There is no aortic valve stenosis. No angiographically apparent CAD. Ao 98%, PA sat 77%, PA pressure 33/19, mean PA 26 mm Hg; PCWP 19 mm Hg; CO 6.7 L/min; CI 3.2 Hemodynamic findings consistent with mild pulmonary hypertension.  TTE 10/2020:  1. Left ventricular ejection fraction, by estimation, is 30 to 35%. The  left ventricle has moderately decreased function. The left ventricle  demonstrates regional wall motion abnormalities (see scoring  diagram/findings for description). Suggestive of  possible stress induced cardiomyopathy although ischemic cardiomyopathy is  not excluded. There is mild left ventricular hypertrophy. Left ventricular  diastolic parameters are indeterminate. Definity  contrast utilzed with  evidence of apical LV thrombus,  some of which is loosely organized.   2. Right ventricular systolic function is normal. The right ventricular   size is normal. Tricuspid regurgitation signal is inadequate for assessing  PA pressure.   3. The mitral valve is grossly normal. Trivial mitral valve  regurgitation.   4. The aortic valve is tricuspid. Aortic valve regurgitation is not  visualized.   5. The inferior vena cava is normal in size with greater than 50%  respiratory variability, suggesting right atrial pressure of 3 mmHg.   Laboratory Data: High Sensitivity Troponin:   Recent Labs  Lab 11/17/24 0136 11/17/24 0502  TROPONINIHS 394* 286*     Chemistry Recent Labs  Lab 11/16/24 1752 11/16/24 2205  NA 135 135  K 4.0 3.7  CL 97* 100  CO2 19* 23  GLUCOSE 364* 197*  BUN 13 11  CREATININE 0.81 0.71  CALCIUM  9.4 8.9  GFRNONAA >60 >60  ANIONGAP 18* 12    No results for input(s): PROT, ALBUMIN, AST, ALT, ALKPHOS, BILITOT in the last 168 hours. Lipids  Recent Labs  Lab 11/17/24 0136  CHOL 146  TRIG 161*  HDL 55  LDLCALC 59  CHOLHDL 2.7    Hematology Recent Labs  Lab 11/16/24 1752  WBC 13.8*  RBC 4.76  HGB 15.9  HCT 43.9  MCV 92.2  MCH 33.4  MCHC 36.2*  RDW 11.4*  PLT 199   Thyroid No results for input(s): TSH, FREET4 in the last 168 hours.  BNPNo results for input(s): BNP, PROBNP in the last 168 hours.  DDimer No results for input(s): DDIMER in the last 168 hours.  Radiology/Studies:  CT Angio Chest/Abd/Pel for Dissection W and/or Wo Contrast Result Date: 11/16/2024 EXAM: CTA CHEST, ABDOMEN AND PELVIS WITHOUT AND WITH CONTRAST 11/16/2024 07:28:28 PM TECHNIQUE: CTA of the chest was performed without and with the administration of 100 mL of iohexol  (OMNIPAQUE ) 350 MG/ML injection. CTA of the abdomen and pelvis was performed without and with the administration of 100 mL of iohexol  (OMNIPAQUE ) 350 MG/ML injection. Multiplanar reformatted images are provided for  review. MIP images are provided for review. Automated exposure control, iterative reconstruction, and/or weight based  adjustment of the mA/kV was utilized to reduce the radiation dose to as low as reasonably achievable. COMPARISON: Same day x-ray. CTA chest 11/10/2020. CT abdomen and pelvis 07/22/2020. CLINICAL HISTORY: Aortic aneurysm suspected. FINDINGS: VASCULATURE: AORTA: Aortic atherosclerotic calcification. No acute aortic syndrome. No abdominal aortic aneurysm. No dissection. PULMONARY ARTERIES: Negative for pulmonary embolism. GREAT VESSELS OF AORTIC ARCH: No acute finding. No dissection. No arterial occlusion or significant stenosis. CELIAC TRUNK: No acute finding. No occlusion or significant stenosis. SUPERIOR MESENTERIC ARTERY: No acute finding. No occlusion or significant stenosis. INFERIOR MESENTERIC ARTERY: No acute finding. No occlusion or significant stenosis. RENAL ARTERIES: No acute finding. No occlusion or significant stenosis. ILIAC ARTERIES: Low-density atherosclerotic plaque in the right common iliac artery causes mild narrowing. No acute finding. No occlusion or significant stenosis. CHEST: MEDIASTINUM: Coronary artery and aortic atherosclerotic calcification. No mediastinal lymphadenopathy. The heart and pericardium demonstrate no acute abnormality. LUNGS AND PLEURA: Mild centrilobular and paraseptal emphysema. Clustered centrilobular micronodules in the left lower lobe compatible with mild small airway infection / inflammation. No focal consolidation or pulmonary edema. No evidence of pleural effusion or pneumothorax. THORACIC BONES AND SOFT TISSUES: No acute bone or soft tissue abnormality. ABDOMEN AND PELVIS: LIVER: Hepatic steatosis. GALLBLADDER AND BILE DUCTS: Gallbladder is unremarkable. No biliary ductal dilatation. SPLEEN: The spleen is unremarkable. PANCREAS: The pancreas is unremarkable. ADRENAL GLANDS: Bilateral adrenal glands demonstrate no acute abnormality. KIDNEYS, URETERS AND BLADDER: 9 mm stone in the lower pole of the right kidney. No hydronephrosis or obstructive calculi. No perinephric or  periureteral stranding. Urinary bladder is unremarkable. GI AND BOWEL: Stomach and duodenal sweep demonstrate no acute abnormality. Normal appendix. There is no bowel obstruction. No abnormal bowel wall thickening or distension. REPRODUCTIVE: Reproductive organs are unremarkable. PERITONEUM AND RETROPERITONEUM: No ascites or free air. LYMPH NODES: No lymphadenopathy. ABDOMINAL BONES AND SOFT TISSUES: Fat-containing right inguinal hernia. No acute abnormality of the bones. No acute soft tissue abnormality. IMPRESSION: 1. No acute aortic syndrome. 2. Mild centrilobular and paraseptal emphysema with clustered centrilobular micronodules in the left lower lobe compatible with mild small airway infection/inflammation. 3. 9 mm nonobstructing right lower pole renal calculus. 4. Hepatic steatosis. Electronically signed by: Norman Gatlin MD 11/16/2024 07:38 PM EST RP Workstation: [id number]   DG Chest Port 1 View Result Date: 11/16/2024 EXAM: 1 VIEW(S) XRAY OF THE CHEST 11/16/2024 06:13:20 PM COMPARISON: 08/24/2023 CLINICAL HISTORY: Chest pain FINDINGS: LUNGS AND PLEURA: No focal pulmonary opacity. No pleural effusion. No pneumothorax. HEART AND MEDIASTINUM: No acute abnormality of the cardiac and mediastinal silhouettes. BONES AND SOFT TISSUES: No acute osseous abnormality. IMPRESSION: 1. No acute cardiopulmonary pathology. Electronically signed by: Norman Gatlin MD 11/16/2024 06:31 PM EST RP Workstation: [id number]     Assessment and Plan: NSTEMI Presents with an episode of sharp, retrosternal chest pain with radiation to the bilateral upper extremities and shortness of breath.  Symptoms lasted for about 15 minutes and resolved with SL NG. Mildly elevated but relatively flat troponin trend (troponin 111 =>131 =>122=> 394=> 286).  EKG with no acute ischemic changes.  His symptoms are concerning for a cardiac event. He is now being treated for mild DKA which possibly could have resulted in elevated troponin trend  from demand ischemia but given his overall symptoms and resolution with nitroglycerin  need to assume this is type I NSTEMI until further evaluation.   - Continue aspirin  81 mg daily, IV heparin  drip, atorvastatin   40 mg daily - Add Toprol  XL 25 mg daily - Will check a 2D echo today to reassess LV function- TTE in the AM - Lipids this admission showed an LDL 59, HDL 55 and triglycerides 161  - HbA1c pending - Recommend left heart cath to redefine coronary anatomy - Make n.p.o. after midnight for left heart cath with possible PCI in the a.m. - Informed Consent   Shared Decision Making/Informed Consent The risks [stroke (1 in 1000), death (1 in 1000), kidney failure [usually temporary] (1 in 500), bleeding (1 in 200), allergic reaction [possibly serious] (1 in 200)], benefits (diagnostic support and management of coronary artery disease) and alternatives of a cardiac catheterization were discussed in detail with Christopher Cross and he is willing to proceed.    HFrEF Last 2D echo 11/11/2020 showed EF 30 to 35% with possible stress cardiomyopathy given mid inferoseptal, mid anterolateral and apical akinesis as well as mid anterior/inferior hypokinesis with normal basal segments euvolemic with no signs of HF. - Repeat 2D echo to reassess LV function - PTA he was on GDMT with losartan  and Lopressor  --he states that he had been compliant with his medication but it appears that both of these meds expired in 2024 with no subsequent refills. - He had been taking his spironolactone  which we will continue at 50 mg daily - Hold ARB for cath and if LV function still remains reduced we will transition to Entresto  - Start Toprol  XL 25 mg daily  History of LV thrombus - Previously on Xarelto  but stopped it due to hematuria; unclear when but patient believes it was stopped ~2 years ago. No recent bleeding issues - Continue heparin  drip for now with 2D echo pending  Hypertension - BP elevated at 120/102 mmHg this  morning - He had been on losartan  and Lopressor  at home but these have not been refilled since 2024 even though he says he has been taking them - Will start Toprol  XL 25 mg daily and continue spironolactone  50 mg daily - Restart ARB/ARNI pending results of 2D echo  I spent 35 minutes caring for this patient today face to face, ordering and reviewing labs, reviewing records from 2D echo from 2021 and cardiac cath from 2121, seeing the patient, documenting in the record, and arranging for a left heart catheterization with possible PCI   For questions or updates, please contact [facility] Please consult [email or web address] for contact info under   Signed, Wilbert Bihari, MD  11/17/2024 8:00 AM

## 2024-11-17 NOTE — Progress Notes (Signed)
 Date and time results received: 11/17/24 : 02:50 AM   Test: Troponin Critical Value: 394  Name of Provider Notified: Dr Otelia & Dr Alfornia  Orders Received? Or Actions Taken?: No

## 2024-11-17 NOTE — Consult Note (Signed)
 Pharmacy Consult Note Pharmacy Consult for Heparin  drip Indication: chest pain/ACS Brief A/P: Heparin  level within goal range Continue Heparin  at current rate   Allergies  Allergen Reactions   Xarelto  [Rivaroxaban ] Other (See Comments)    Made pt pee blood    PATIENT MEASUREMENTS: Height: 5' 9 (175.3 cm) Weight: 90.1 kg (198 lb 10.2 oz) IBW/kg (Calculated) : 70.7 HEPARIN  DW (KG): 89.1  VITAL SIGNS: Temp: 98.1 F (36.7 C) (11/30 0300) Temp Source: Oral (11/30 0300) BP: 124/93 (11/30 0300) Pulse Rate: 95 (11/30 0300)  Recent Labs    11/16/24 1752 11/16/24 1752 11/16/24 2205 11/17/24 0136  HGB 15.9  --   --   --   HCT 43.9  --   --   --   PLT 199  --   --   --   APTT 30  --   --   --   LABPROT 13.3  --   --   --   INR 1.0  --   --   --   HEPARINUNFRC  --    < > >1.10* 0.38  CREATININE 0.81  --  0.71  --   TROPONINIHS  --   --   --  394*   < > = values in this interval not displayed.    Estimated Creatinine Clearance: 114.5 mL/min (by C-G formula based on SCr of 0.71 mg/dL).  ASSESSMENT: 58 y.o. male with chest pain for heparin    Goal(s) of therapy: Heparin  level 0.3-0.7 units/mL Monitor platelets by anticoagulation protocol: Yes    PLAN: No change to heparin    Cathlyn Arrant, PharmD, BCPS  11/17/2024 4:06 AM

## 2024-11-17 NOTE — Plan of Care (Signed)
  Problem: Metabolic: Goal: Ability to maintain appropriate glucose levels will improve Outcome: Progressing   Problem: Nutritional: Goal: Maintenance of adequate nutrition will improve Outcome: Progressing   Problem: Cardiac: Goal: Ability to maintain an adequate cardiac output will improve Outcome: Progressing   Problem: Activity: Goal: Risk for activity intolerance will decrease Outcome: Progressing

## 2024-11-17 NOTE — Progress Notes (Signed)
  Echocardiogram 2D Echocardiogram has been performed.  Christopher Cross 11/17/2024, 9:47 AM

## 2024-11-17 NOTE — Inpatient Diabetes Management (Signed)
 Inpatient Diabetes Program Recommendations  AACE/ADA: New Consensus Statement on Inpatient Glycemic Control (2015)  Target Ranges:  Prepandial:   less than 140 mg/dL      Peak postprandial:   less than 180 mg/dL (1-2 hours)      Critically ill patients:  140 - 180 mg/dL   Lab Results  Component Value Date   GLUCAP 272 (H) 11/17/2024   HGBA1C 11.6 (H) 11/20/2023    Review of Glycemic Control  Diabetes history: DM 2 Outpatient Diabetes medications: 70/30 50 units Daily, Metformin  1000 mg bid Current orders for Inpatient glycemic control:  IV insulin  transitioning to 70/30 25 units bid  Inpatient Diabetes Program Recommendations:    NOTE: pt requiring between 3-6 units/hour to maintan glucose trends, may need to titrate insulin .  -   Add Novolog  0-15 units tid + hs  Ran out of insulin  2 days ago.  Pt has the option of [facility] insulin  as a back up option. If pharmacy is using a vial for inpatient 70/30, pt can receive this vial at time of discharge to use. Will need syringes.  Follow up with PCP.  Thanks,  Clotilda Bull RN, MSN, BC-ADM Inpatient Diabetes Coordinator Team Pager [phone] (8a-5p)

## 2024-11-18 ENCOUNTER — Other Ambulatory Visit (HOSPITAL_COMMUNITY): Payer: Self-pay

## 2024-11-18 ENCOUNTER — Encounter (HOSPITAL_COMMUNITY): Payer: Self-pay | Admitting: Internal Medicine

## 2024-11-18 ENCOUNTER — Encounter (HOSPITAL_COMMUNITY): Admission: EM | Disposition: A | Payer: Self-pay | Source: Home / Self Care | Attending: Family Medicine

## 2024-11-18 ENCOUNTER — Telehealth (HOSPITAL_COMMUNITY): Payer: Self-pay

## 2024-11-18 DIAGNOSIS — I10 Essential (primary) hypertension: Secondary | ICD-10-CM

## 2024-11-18 DIAGNOSIS — I5020 Unspecified systolic (congestive) heart failure: Secondary | ICD-10-CM

## 2024-11-18 DIAGNOSIS — I5043 Acute on chronic combined systolic (congestive) and diastolic (congestive) heart failure: Secondary | ICD-10-CM | POA: Diagnosis present

## 2024-11-18 DIAGNOSIS — E785 Hyperlipidemia, unspecified: Secondary | ICD-10-CM

## 2024-11-18 DIAGNOSIS — E7800 Pure hypercholesterolemia, unspecified: Secondary | ICD-10-CM

## 2024-11-18 HISTORY — PX: LEFT HEART CATH AND CORONARY ANGIOGRAPHY: CATH118249

## 2024-11-18 LAB — CBC
HCT: 37.9 % — ABNORMAL LOW (ref 39.0–52.0)
Hemoglobin: 13.5 g/dL (ref 13.0–17.0)
MCH: 33.2 pg (ref 26.0–34.0)
MCHC: 35.6 g/dL (ref 30.0–36.0)
MCV: 93.1 fL (ref 80.0–100.0)
Platelets: 147 K/uL — ABNORMAL LOW (ref 150–400)
RBC: 4.07 MIL/uL — ABNORMAL LOW (ref 4.22–5.81)
RDW: 11.6 % (ref 11.5–15.5)
WBC: 9.7 K/uL (ref 4.0–10.5)
nRBC: 0.4 % — ABNORMAL HIGH (ref 0.0–0.2)

## 2024-11-18 LAB — BASIC METABOLIC PANEL WITH GFR
Anion gap: 6 (ref 5–15)
BUN: 9 mg/dL (ref 6–20)
CO2: 25 mmol/L (ref 22–32)
Calcium: 8.4 mg/dL — ABNORMAL LOW (ref 8.9–10.3)
Chloride: 104 mmol/L (ref 98–111)
Creatinine, Ser: 0.89 mg/dL (ref 0.61–1.24)
GFR, Estimated: >60 mL/min (ref >60)
Glucose, Bld: 253 mg/dL — ABNORMAL HIGH (ref 70–99)
Potassium: 3.7 mmol/L (ref 3.5–5.1)
Sodium: 135 mmol/L (ref 135–145)

## 2024-11-18 LAB — GLUCOSE, CAPILLARY
Glucose-Capillary: 252 mg/dL — ABNORMAL HIGH (ref 70–99)
Glucose-Capillary: 327 mg/dL — ABNORMAL HIGH (ref 70–99)
Glucose-Capillary: 279 mg/dL — ABNORMAL HIGH (ref 70–99)
Glucose-Capillary: 235 mg/dL — ABNORMAL HIGH (ref 70–99)

## 2024-11-18 LAB — HEMOGLOBIN A1C
Hgb A1c MFr Bld: 11.6 % — ABNORMAL HIGH (ref 4.8–5.6)
Hgb A1c MFr Bld: 11.7 % — ABNORMAL HIGH (ref 4.8–5.6)
Mean Plasma Glucose: 286 mg/dL
Mean Plasma Glucose: 289 mg/dL

## 2024-11-18 LAB — HEPARIN LEVEL (UNFRACTIONATED): Heparin Unfractionated: 0.43 [IU]/mL (ref 0.30–0.70)

## 2024-11-18 SURGERY — LEFT HEART CATH AND CORONARY ANGIOGRAPHY
Anesthesia: LOCAL

## 2024-11-18 MED ORDER — HYDRALAZINE HCL 20 MG/ML IJ SOLN: 10.0000 mg | INTRAMUSCULAR | Status: AC | PRN

## 2024-11-18 MED ORDER — MIDAZOLAM HCL (PF) 2 MG/2ML IJ SOLN
INTRAMUSCULAR | Status: DC | PRN
  Administered 2024-11-18: 2 mg via INTRAVENOUS

## 2024-11-18 MED ORDER — SODIUM CHLORIDE 0.9% FLUSH
3.0000 mL | Freq: Two times a day (BID) | INTRAVENOUS | Status: DC
  Administered 2024-11-18 – 2024-11-19 (×3): 3 mL via INTRAVENOUS

## 2024-11-18 MED ORDER — IPRATROPIUM-ALBUTEROL 0.5-2.5 (3) MG/3ML IN SOLN
3.0000 mL | RESPIRATORY_TRACT | Status: DC | PRN
  Administered 2024-11-18: 3 mL via RESPIRATORY_TRACT
  Filled 2024-11-18: qty 3

## 2024-11-18 MED ORDER — VERAPAMIL HCL 2.5 MG/ML IV SOLN
INTRAVENOUS | Status: DC | PRN
  Administered 2024-11-18: 10 mL via INTRA_ARTERIAL

## 2024-11-18 MED ORDER — MIDAZOLAM HCL 2 MG/2ML IJ SOLN
INTRAMUSCULAR | Status: AC
  Filled 2024-11-18: qty 2

## 2024-11-18 MED ORDER — INSULIN ASPART PROT & ASPART (70-30 MIX) 100 UNIT/ML ~~LOC~~ SUSP
35.0000 [IU] | Freq: Two times a day (BID) | SUBCUTANEOUS | Status: DC
  Administered 2024-11-18 – 2024-11-19 (×2): 35 [IU] via SUBCUTANEOUS
  Filled 2024-11-18: qty 10

## 2024-11-18 MED ORDER — SODIUM CHLORIDE 0.9% FLUSH: 3.0000 mL | INTRAVENOUS | Status: DC | PRN

## 2024-11-18 MED ORDER — ACETAMINOPHEN 325 MG PO TABS: 650.0000 mg | ORAL_TABLET | ORAL | Status: DC | PRN

## 2024-11-18 MED ORDER — HEPARIN (PORCINE) IN NACL 2000-0.9 UNIT/L-% IV SOLN
INTRAVENOUS | Status: DC | PRN
  Administered 2024-11-18: 1000 mL

## 2024-11-18 MED ORDER — VERAPAMIL HCL 2.5 MG/ML IV SOLN
INTRAVENOUS | Status: AC
  Filled 2024-11-18: qty 2

## 2024-11-18 MED ORDER — HEPARIN SODIUM (PORCINE) 1000 UNIT/ML IJ SOLN
INTRAMUSCULAR | Status: AC
  Filled 2024-11-18: qty 10

## 2024-11-18 MED ORDER — LIDOCAINE HCL (PF) 1 % IJ SOLN
INTRAMUSCULAR | Status: DC | PRN
  Administered 2024-11-18: 5 mL

## 2024-11-18 MED ORDER — SACUBITRIL-VALSARTAN 24-26 MG PO TABS
1.0000 | ORAL_TABLET | Freq: Two times a day (BID) | ORAL | Status: DC
  Administered 2024-11-18 – 2024-11-19 (×3): 1 via ORAL
  Filled 2024-11-18 (×3): qty 1

## 2024-11-18 MED ORDER — LABETALOL HCL 5 MG/ML IV SOLN: 10.0000 mg | INTRAVENOUS | Status: AC | PRN

## 2024-11-18 MED ORDER — ATORVASTATIN CALCIUM 40 MG PO TABS
40.0000 mg | ORAL_TABLET | Freq: Every day | ORAL | Status: DC
  Administered 2024-11-18 – 2024-11-19 (×2): 40 mg via ORAL
  Filled 2024-11-18 (×2): qty 1

## 2024-11-18 MED ORDER — SODIUM CHLORIDE 0.9 % IV SOLN: 250.0000 mL | INTRAVENOUS | Status: AC | PRN

## 2024-11-18 MED ORDER — LIDOCAINE HCL (PF) 1 % IJ SOLN
INTRAMUSCULAR | Status: AC
  Filled 2024-11-18: qty 30

## 2024-11-18 MED ORDER — FREE WATER
500.0000 mL | Freq: Once | Status: AC
  Administered 2024-11-18: 500 mL via ORAL

## 2024-11-18 MED ORDER — HEPARIN SODIUM (PORCINE) 1000 UNIT/ML IJ SOLN
INTRAMUSCULAR | Status: DC | PRN
  Administered 2024-11-18: 4500 [IU] via INTRAVENOUS

## 2024-11-18 MED ORDER — FENTANYL CITRATE (PF) 100 MCG/2ML IJ SOLN
INTRAMUSCULAR | Status: DC | PRN
  Administered 2024-11-18: 25 ug via INTRAVENOUS

## 2024-11-18 MED ORDER — FENTANYL CITRATE (PF) 100 MCG/2ML IJ SOLN
INTRAMUSCULAR | Status: AC
  Filled 2024-11-18: qty 2

## 2024-11-18 MED ORDER — HEPARIN SODIUM (PORCINE) 5000 UNIT/ML IJ SOLN
5000.0000 [IU] | Freq: Three times a day (TID) | INTRAMUSCULAR | Status: DC
  Administered 2024-11-18 – 2024-11-19 (×2): 5000 [IU] via SUBCUTANEOUS
  Filled 2024-11-18 (×2): qty 1

## 2024-11-18 MED ORDER — IOHEXOL 350 MG/ML SOLN
INTRAVENOUS | Status: DC | PRN
  Administered 2024-11-18: 60 mL

## 2024-11-18 SURGICAL SUPPLY — 8 items
CATH INFINITI AMBI 5FR TG (CATHETERS) IMPLANT
DEVICE RAD COMP TR BAND LRG (VASCULAR PRODUCTS) IMPLANT
GLIDESHEATH SLEND SS 6F .021 (SHEATH) IMPLANT
GUIDEWIRE INQWIRE 1.5J.035X260 (WIRE) IMPLANT
KIT SYRINGE INJ CVI SPIKEX1 (MISCELLANEOUS) IMPLANT
PACK CARDIAC CATHETERIZATION (CUSTOM PROCEDURE TRAY) ×1 IMPLANT
SET ATX-X65L (MISCELLANEOUS) IMPLANT
SHEATH PROBE COVER 6X72 (BAG) IMPLANT

## 2024-11-18 NOTE — TOC Initial Note (Signed)
 Transition of Care [facility]) - Initial/Assessment Note    Patient Details  Name: Christopher Cross MRN: [id number] Date of Birth: 1966/04/02  Transition of Care [facility]) CM/SW Contact:    Christopher KANDICE Stain, RN Phone Number: 11/18/2024, 4:08 PM  Clinical Narrative:                   Spoke to patient regarding transition needs.  Patient no longer has a job  that Christopher Cross and is requesting an apt at a clinic that takes patient without insurance. CMA made PCP apt.  MATCH letter sent to [facility] pharmacy.  Patient will be discharged on Entresto. Benefit check for Sim is $101. Patient made aware.  ICM (Inpatient Care Management) will continue to follow for needs.  Expected Discharge Plan: Home/Self Care Barriers to Discharge: Continued Medical Work up   Patient Goals and CMS Choice Patient states their goals for this hospitalization and ongoing recovery are:: return home          Expected Discharge Plan and Services In-house Referral: PCP / Management Consultant, Artist Discharge Planning Services: Follow-up appt scheduled, MATCH Program   Living arrangements for the past 2 months: Single Family Home                                      Prior Living Arrangements/Services Living arrangements for the past 2 months: Single Family Home   Patient language and need for interpreter reviewed:: Yes Do you feel safe going back to the place where you live?: Yes      Need for Family Participation in Patient Care: Yes (Comment) Care giver support system in place?: Yes (comment)   Criminal Activity/Legal Involvement Pertinent to Current Situation/Hospitalization: No - Comment as needed  Activities of Daily Living   ADL Screening (condition at time of admission) Independently performs ADLs?: Yes (appropriate for developmental age) Is the patient deaf or have difficulty hearing?: No Does the patient have difficulty seeing, even when wearing glasses/contacts?: No Does the  patient have difficulty concentrating, remembering, or making decisions?: No  Permission Sought/Granted                  Emotional Assessment Appearance:: Appears stated age Attitude/Demeanor/Rapport: Engaged Affect (typically observed): Accepting Orientation: : Oriented to Self, Oriented to Place, Oriented to  Time, Oriented to Situation Alcohol / Substance Use: Not Applicable Psych Involvement: No (comment)  Admission diagnosis:  Hyperglycemia [R73.9] NSTEMI (non-ST elevated myocardial infarction) [facility]) [I21.4] Patient Active Problem List   Diagnosis Date Noted   Acute on chronic combined systolic and diastolic CHF (congestive heart failure) (HCC) 11/18/2024   Chronic HFrEF (heart failure with reduced ejection fraction) (HCC) 11/17/2024   LV (left ventricular) mural thrombus 11/17/2024   NICM (nonischemic cardiomyopathy) (HCC) 11/17/2024   Demand ischemia (HCC) 11/16/2024   DKA (diabetic ketoacidosis) (HCC) 11/16/2024   Alcohol use 11/16/2024   Acute systolic heart failure (HCC)    COPD exacerbation (HCC) 11/11/2020   Uncontrolled type 2 diabetes mellitus with hypoglycemia (HCC) 11/11/2020   Hypokalemia 11/11/2020   Elevated troponin 11/11/2020   Prolonged QT interval 11/11/2020   Tobacco use 11/11/2020   Transaminitis 11/11/2020   Chest pain 07/15/2014   HTN (hypertension) 07/15/2014   PCP:  Christopher Cross, Christopher Demissie, MD Pharmacy:   [facility] [address] [place] [address] [place] [address] Phone: [phone] Fax: [phone]  [facility] [address] [place] [address] Phone: [phone] Fax: [phone]     Social Drivers of Health (SDOH) Social History: SDOH Screenings   Food Insecurity: No Food Insecurity (11/17/2024)  Housing: Low Risk  (11/17/2024)  Transportation Needs: No Transportation Needs (11/17/2024)  Utilities: Not At Risk (11/17/2024)  Alcohol Screen: Low  Risk  (11/18/2024)  Financial Resource Strain: High Risk (11/18/2024)  Tobacco Use: High Risk (11/17/2024)   SDOH Interventions: Alcohol Usage Interventions: Intervention Not Indicated (Score <7) Financial Strain Interventions: Other (Comment)   Readmission Risk Interventions     No data to display

## 2024-11-18 NOTE — Telephone Encounter (Signed)
 Pharmacy Patient Advocate Encounter  Insurance verification completed.    The patient is uninsured   Ran test claim for Entresto  24-26mg  and the current 30 day cash price is $101.51.   This test claim was processed through  [facility]- copay amounts may vary at other pharmacies due to pharmacy/plan contracts, or as the patient moves through the different stages of their insurance plan.

## 2024-11-18 NOTE — Inpatient Diabetes Management (Addendum)
 Inpatient Diabetes Program Recommendations  AACE/ADA: New Consensus Statement on Inpatient Glycemic Control   Target Ranges:  Prepandial:   less than 140 mg/dL      Peak postprandial:   less than 180 mg/dL (1-2 hours)      Critically ill patients:  140 - 180 mg/dL    Latest Reference Range & Units 11/17/24 07:30 11/17/24 08:30 11/17/24 09:43 11/17/24 10:42 11/17/24 11:42 11/17/24 16:13 11/17/24 21:29 11/18/24 06:18  Glucose-Capillary 70 - 99 mg/dL 865 (H) 860 (H) 727 (H) 275 (H) 239 (H) 314 (H) 225 (H) 252 (H)    Latest Reference Range & Units 11/16/24 17:52  Hemoglobin A1C 4.8 - 5.6 % 11.6 (H)    Latest Reference Range & Units 11/16/24 17:52  CO2 22 - 32 mmol/L 19 (L)  Glucose 70 - 99 mg/dL 635 (H)  Mean Plasma Glucose mg/dL 713  Anion gap 5 - 15  18 (H)    Latest Reference Range & Units 11/16/24 17:52 11/17/24 01:36 11/17/24 05:02 11/17/24 09:54  Beta-Hydroxybutyric Acid 0.05 - 0.27 mmol/L 0.67 (H) 0.08 0.32 (H) 0.12   Review of Glycemic Control  Diabetes history: DM2 Outpatient Diabetes medications: 70/30 50 units daily (ran out 2 days prior to admission), Metformin  1000 mg BID Current orders for Inpatient glycemic control: 70/30 35 units BID, Novolog  0-15 units TID with meals, Novolog  0-5 units QHS  Inpatient Diabetes Program Recommendations:    Insulin : Noted 70/30 increased from 25 units BID to 35 units BID today.  HbgA1C: A1C 11.6% on 11/16/24 indicating an average glucose of 286 mg/dl over the past 2-3 months.   Outpatient DM: AT time of discharge, please provide Rx for Novolin  70/30 generic insulin  pens (#[id number]), insulin  pen needles [phone]).  NOTE: Patient admitted with NSTEMI, DKA, and chronic alcohol use. Initial glucose 364 mg/dl on 88/70/74g. Patient has no insurance and will need affordable insulin  at discharge. Therefore, recommend to discharge on Novolin  70/30 which is $43 per box of 5 insulin  pens or $25 per vial at [facility].  Addendum 11/18/24@11 :25-Spoke with  patient at bedside about diabetes and home regimen for diabetes control. Patient reports being followed by Dr. Carlette. Patient reports he lost his insurance so he is not sure if he will be able to afford copay to see Dr. Carlette. Patient states he lives in [place] and would like information on clinics for uninsured as he may need to start going to clinic for care and to get lower cost medications.  Informed patient that TOC would be consulted to provide list of clinics for uninsured in [facility]. Patient reports he had been getting Novolin  70/30 insulin  pens from [facility] over the counter. Patient reports he was taking 70/30 50 units once a day.  Discussed 70/30 insulin , duration, and how it works; explained that 70/30 is typically taken BID with breakfast and supper.  Patient reports that he knows his body and how he feels and symptoms he gets when glucose is running high or if it gets to low.  Discussed importance of glucose monitoring 2-3 times per day in order to know exactly how glucose is trending and to help with making medication adjustments.  Discussed A1C results (11.6% on 11/16/24) and explained that current A1C indicates an average glucose of 286 mg/dl over the past 2-3 months. Discussed glucose and A1C goals. Discussed importance of checking CBGs and maintaining good CBG control to prevent long-term and short-term complications.  Stressed to the patient the importance of improving glycemic control to prevent  further complications from uncontrolled diabetes. Patient reports that he was told he will be getting medications from onsite pharmacy at discharge and then Rx would be sent to [facility].  Patient would like to have meds to bed at discharge. Encouraged patient to take DM medications as prescribed, to get appointment a clinic or with prior PCP if affordable for follow up, and asked that he take glucometer to appointments.  Patient verbalized understanding of information discussed and  reports no further questions at this time related to diabetes.  Thanks, Earnie Gainer, RN, MSN, [facility] Diabetes Coordinator Inpatient Diabetes Program [phone] (Team Pager from 8am to 5pm)

## 2024-11-18 NOTE — Plan of Care (Signed)
  Problem: Coping: Goal: Ability to adjust to condition or change in health will improve Outcome: Progressing   Problem: Nutritional: Goal: Maintenance of adequate nutrition will improve Outcome: Progressing   Problem: Tissue Perfusion: Goal: Adequacy of tissue perfusion will improve Outcome: Progressing   Problem: Education: Goal: Knowledge of General Education information will improve Description: Including pain rating scale, medication(s)/side effects and non-pharmacologic comfort measures Outcome: Progressing

## 2024-11-18 NOTE — Plan of Care (Signed)
   Problem: Coping: Goal: Ability to adjust to condition or change in health will improve Outcome: Progressing   Problem: Nutritional: Goal: Maintenance of adequate nutrition will improve Outcome: Progressing   Problem: Skin Integrity: Goal: Risk for impaired skin integrity will decrease Outcome: Progressing

## 2024-11-18 NOTE — Progress Notes (Signed)
 TR band removed and transparent dressing placed with no complications, will continue to monitor.

## 2024-11-18 NOTE — Progress Notes (Addendum)
 Progress Note  Patient Name: Christopher Cross Date of Encounter: 11/18/2024  [facility] Cardiologist: Alvan Carrier, MD   Patient Profile     Subjective   cardiac cath today  showed normal coronary arteries with LVEDP 19 mmHg given  No further chest pain.   2D echo showed severe hypokinesis to akinesis of the mid to apical portions of the LV consistent with either a large LAD lesion or stress cardiomyopathy/MI  Inpatient Medications    Scheduled Meds:  [MAR Hold] aspirin  EC  81 mg Oral Daily   [MAR Hold] atorvastatin   40 mg Oral QPM   [MAR Hold] folic acid   1 mg Oral Daily   influenza vac split trivalent PF  0.5 mL Intramuscular Tomorrow-1000   [MAR Hold] insulin  aspart  0-15 Units Subcutaneous TID WC   [MAR Hold] insulin  aspart  0-5 Units Subcutaneous QHS   [MAR Hold] insulin  aspart protamine- aspart  35 Units Subcutaneous BID WC   [MAR Hold] LORazepam   0-4 mg Oral Q6H   Followed by   [FJM Hold] LORazepam   0-4 mg Oral Q12H   [MAR Hold] metoprolol  succinate  25 mg Oral Daily   [MAR Hold] multivitamin with minerals  1 tablet Oral Daily   [MAR Hold] nicotine   14 mg Transdermal Daily   [MAR Hold] spironolactone   50 mg Oral Daily   [MAR Hold] thiamine   100 mg Oral Daily   Or   [MAR Hold] thiamine   100 mg Intravenous Daily   Continuous Infusions:  heparin  1,350 Units/hr (11/17/24 1517)   PRN Meds: [MAR Hold] dextrose, fentaNYL , Heparin  (Porcine) in NaCl, heparin  sodium (porcine), iohexol , [MAR Hold] ipratropium-albuterol , lidocaine  (PF), [MAR Hold] LORazepam  **OR** [MAR Hold] LORazepam , midazolam  PF, [MAR Hold] nitroGLYCERIN , Radial Cocktail/Verapamil  only   Vital Signs    Vitals:   11/17/24 2336 11/18/24 0429 11/18/24 0722 11/18/24 0748  BP: 101/73 100/67 (!) 114/91   Pulse: 84 85 84   Resp: 19 18 15    Temp: 97.6 F (36.4 C) 98.6 F (37 C) 98.7 F (37.1 C)   TempSrc: Oral Oral Oral   SpO2: 96% 92% 95% 97%  Weight:      Height:        Intake/Output  Summary (Last 24 hours) at 11/18/2024 0817 Last data filed at 11/18/2024 0451 Gross per 24 hour  Intake 980 ml  Output 2050 ml  Net -1070 ml      11/17/2024    9:33 PM 11/16/2024   11:42 PM 11/16/2024    5:18 PM  Last 3 Weights  Weight (lbs) 198 lb 3.1 oz 198 lb 10.2 oz 200 lb 1.6 oz  Weight (kg) 89.9 kg 90.1 kg 90.765 kg      Telemetry    Normal sinus rhythm- Personally Reviewed  ECG    No new EKG to review- Personally Reviewed  Physical Exam   GEN: No acute distress.   Neck: No JVD Cardiac: RRR, no murmurs, rubs, or gallops.  Respiratory: Clear to auscultation bilaterally. GI: Soft, nontender, non-distended  MS: No edema; No deformity. Neuro:  Nonfocal  Psych: Normal affect   Labs    High Sensitivity Troponin:   Recent Labs  Lab 11/17/24 0136 11/17/24 0502 11/17/24 0954 11/17/24 1410 11/17/24 1810  TROPONINIHS 394* 286* 239* 173* 162*      Chemistry Recent Labs  Lab 11/16/24 1752 11/16/24 2205 11/18/24 0233  NA 135 135 135  K 4.0 3.7 3.7  CL 97* 100 104  CO2 19* 23 25  GLUCOSE 364* 197* 253*  BUN 13 11 9   CREATININE 0.81 0.71 0.89  CALCIUM  9.4 8.9 8.4*  GFRNONAA >60 >60 >60  ANIONGAP 18* 12 6     Hematology Recent Labs  Lab 11/16/24 1752 11/18/24 0233  WBC 13.8* 9.7  RBC 4.76 4.07*  HGB 15.9 13.5  HCT 43.9 37.9*  MCV 92.2 93.1  MCH 33.4 33.2  MCHC 36.2* 35.6  RDW 11.4* 11.6  PLT 199 147*    BNPNo results for input(s): BNP, PROBNP in the last 168 hours.   DDimer No results for input(s): DDIMER in the last 168 hours.    Radiology    ECHOCARDIOGRAM COMPLETE Result Date: 11/17/2024    ECHOCARDIOGRAM REPORT   Patient Name:   CHANOCH MCCLEERY Antonio Date of Exam: 11/17/2024 Medical Rec #:  [id number]     Height:       69.0 in Accession #:    [id number]    Weight:       198.6 lb Date of Birth:  [birth year]     BSA:          2.060 m Patient Age:    58 years      BP:           117/86 mmHg Patient Gender: M             HR:           100  bpm. Exam Location:  Inpatient Procedure: 2D Echo (Both Spectral and Color Flow Doppler were utilized during            procedure). Indications:    chest pain  History:        Patient has prior history of Echocardiogram examinations, most                 recent 11/11/2020. Cardiomyopathy, COPD; Risk Factors:Current                 Smoker, Hypertension and Diabetes.  Sonographer:    Tinnie Barefoot RDCS Referring Phys: [id number] HUSAM M [facility] IMPRESSIONS  1. Cannot rule out early forming mural apical LV thrombus. Left ventricular ejection fraction, by estimation, is 20 to 25%. The left ventricle has severely decreased function. The left ventricle demonstrates regional wall motion abnormalities (see scoring diagram/findings for description). Left ventricular diastolic function could not be evaluated. There is akinesis of the left ventricular, mid-apical septal wall, anteroseptal wall, inferoseptal wall, anterior wall, inferior wall and anterolateral  wall. There is akinesis of the left ventricular, apical inferolateral wall. There is akinesis of the left ventricular, apical segment. Hyperkinesis of the basal segments     m\esine  2. Right ventricular systolic function is normal. The right ventricular size is normal. Tricuspid regurgitation signal is inadequate for assessing PA pressure.  3. The mitral valve is normal in structure. No evidence of mitral valve regurgitation. No evidence of mitral stenosis.  4. The aortic valve is normal in structure. Aortic valve regurgitation is not visualized. No aortic stenosis is present.  5. There is mild (Grade II) plaque involving the descending aorta.  6. The inferior vena cava is normal in size with greater than 50% respiratory variability, suggesting right atrial pressure of 3 mmHg. FINDINGS  Left Ventricle: Cannot rule out early forming mural apical LV thrombus. Left ventricular ejection fraction, by estimation, is 20 to 25%. The left ventricle has severely decreased  function. The left ventricle demonstrates regional wall motion abnormalities. Definity  contrast agent was given IV to delineate the  left ventricular endocardial borders. The left ventricular internal cavity size was normal in size. There is no left ventricular hypertrophy. Left ventricular diastolic function could not be evaluated. Right Ventricle: The right ventricular size is normal. No increase in right ventricular wall thickness. Right ventricular systolic function is normal. Tricuspid regurgitation signal is inadequate for assessing PA pressure. Left Atrium: Left atrial size was normal in size. Right Atrium: Right atrial size was normal in size. Pericardium: There is no evidence of pericardial effusion. Mitral Valve: The mitral valve is normal in structure. No evidence of mitral valve regurgitation. No evidence of mitral valve stenosis. Tricuspid Valve: The tricuspid valve is normal in structure. Tricuspid valve regurgitation is not demonstrated. No evidence of tricuspid stenosis. Aortic Valve: The aortic valve is normal in structure. Aortic valve regurgitation is not visualized. No aortic stenosis is present. Pulmonic Valve: The pulmonic valve was normal in structure. Pulmonic valve regurgitation is not visualized. No evidence of pulmonic stenosis. Aorta: The aortic root is normal in size and structure. There is mild (Grade II) plaque involving the descending aorta. Venous: The inferior vena cava is normal in size with greater than 50% respiratory variability, suggesting right atrial pressure of 3 mmHg. IAS/Shunts: No atrial level shunt detected by color flow Doppler.  LEFT VENTRICLE PLAX 2D LVIDd:         5.20 cm   Diastology LVIDs:         4.60 cm   LV e' medial:  5.22 cm/s LV PW:         1.00 cm   LV e' lateral: 5.77 cm/s LV IVS:        1.00 cm LVOT diam:     2.30 cm LV SV:         44 LV SV Index:   22 LVOT Area:     4.15 cm LV IVRT:       106 msec  RIGHT VENTRICLE             IVC RV Basal diam:  2.30 cm      IVC diam: 1.80 cm RV S prime:     12.20 cm/s TAPSE (M-mode): 1.6 cm LEFT ATRIUM             Index        RIGHT ATRIUM          Index LA diam:        2.90 cm 1.41 cm/m   RA Area:     8.64 cm LA Vol (A2C):   53.3 ml 25.87 ml/m  RA Volume:   20.00 ml 9.71 ml/m LA Vol (A4C):   31.2 ml 15.15 ml/m LA Biplane Vol: 41.9 ml 20.34 ml/m  AORTIC VALVE LVOT Vmax:   74.60 cm/s LVOT Vmean:  48.200 cm/s LVOT VTI:    0.107 m  AORTA Ao Root diam: 3.10 cm Ao Asc diam:  3.00 cm  SHUNTS Systemic VTI:  0.11 m Systemic Diam: 2.30 cm Wilbert Bihari MD Electronically signed by Wilbert Bihari MD Signature Date/Time: 11/17/2024/10:54:53 AM    Final    CT Angio Chest/Abd/Pel for Dissection W and/or Wo Contrast Result Date: 11/16/2024 EXAM: CTA CHEST, ABDOMEN AND PELVIS WITHOUT AND WITH CONTRAST 11/16/2024 07:28:28 PM TECHNIQUE: CTA of the chest was performed without and with the administration of 100 mL of iohexol  (OMNIPAQUE ) 350 MG/ML injection. CTA of the abdomen and pelvis was performed without and with the administration of 100 mL of iohexol  (OMNIPAQUE ) 350 MG/ML injection. Multiplanar reformatted images are  provided for review. MIP images are provided for review. Automated exposure control, iterative reconstruction, and/or weight based adjustment of the mA/kV was utilized to reduce the radiation dose to as low as reasonably achievable. COMPARISON: Same day x-ray. CTA chest 11/10/2020. CT abdomen and pelvis 07/22/2020. CLINICAL HISTORY: Aortic aneurysm suspected. FINDINGS: VASCULATURE: AORTA: Aortic atherosclerotic calcification. No acute aortic syndrome. No abdominal aortic aneurysm. No dissection. PULMONARY ARTERIES: Negative for pulmonary embolism. GREAT VESSELS OF AORTIC ARCH: No acute finding. No dissection. No arterial occlusion or significant stenosis. CELIAC TRUNK: No acute finding. No occlusion or significant stenosis. SUPERIOR MESENTERIC ARTERY: No acute finding. No occlusion or significant stenosis. INFERIOR MESENTERIC  ARTERY: No acute finding. No occlusion or significant stenosis. RENAL ARTERIES: No acute finding. No occlusion or significant stenosis. ILIAC ARTERIES: Low-density atherosclerotic plaque in the right common iliac artery causes mild narrowing. No acute finding. No occlusion or significant stenosis. CHEST: MEDIASTINUM: Coronary artery and aortic atherosclerotic calcification. No mediastinal lymphadenopathy. The heart and pericardium demonstrate no acute abnormality. LUNGS AND PLEURA: Mild centrilobular and paraseptal emphysema. Clustered centrilobular micronodules in the left lower lobe compatible with mild small airway infection / inflammation. No focal consolidation or pulmonary edema. No evidence of pleural effusion or pneumothorax. THORACIC BONES AND SOFT TISSUES: No acute bone or soft tissue abnormality. ABDOMEN AND PELVIS: LIVER: Hepatic steatosis. GALLBLADDER AND BILE DUCTS: Gallbladder is unremarkable. No biliary ductal dilatation. SPLEEN: The spleen is unremarkable. PANCREAS: The pancreas is unremarkable. ADRENAL GLANDS: Bilateral adrenal glands demonstrate no acute abnormality. KIDNEYS, URETERS AND BLADDER: 9 mm stone in the lower pole of the right kidney. No hydronephrosis or obstructive calculi. No perinephric or periureteral stranding. Urinary bladder is unremarkable. GI AND BOWEL: Stomach and duodenal sweep demonstrate no acute abnormality. Normal appendix. There is no bowel obstruction. No abnormal bowel wall thickening or distension. REPRODUCTIVE: Reproductive organs are unremarkable. PERITONEUM AND RETROPERITONEUM: No ascites or free air. LYMPH NODES: No lymphadenopathy. ABDOMINAL BONES AND SOFT TISSUES: Fat-containing right inguinal hernia. No acute abnormality of the bones. No acute soft tissue abnormality. IMPRESSION: 1. No acute aortic syndrome. 2. Mild centrilobular and paraseptal emphysema with clustered centrilobular micronodules in the left lower lobe compatible with mild small airway  infection/inflammation. 3. 9 mm nonobstructing right lower pole renal calculus. 4. Hepatic steatosis. Electronically signed by: Norman Gatlin MD 11/16/2024 07:38 PM EST RP Workstation: [id number]   DG Chest Port 1 View Result Date: 11/16/2024 EXAM: 1 VIEW(S) XRAY OF THE CHEST 11/16/2024 06:13:20 PM COMPARISON: 08/24/2023 CLINICAL HISTORY: Chest pain FINDINGS: LUNGS AND PLEURA: No focal pulmonary opacity. No pleural effusion. No pneumothorax. HEART AND MEDIASTINUM: No acute abnormality of the cardiac and mediastinal silhouettes. BONES AND SOFT TISSUES: No acute osseous abnormality. IMPRESSION: 1. No acute cardiopulmonary pathology. Electronically signed by: Norman Gatlin MD 11/16/2024 06:31 PM EST RP Workstation: [id number]    Patient Profile     58 y.o. male with a hx of T2DM, HTN, asthma/COPD, non-ischemic HFrEF (LVEF30-35%), history of LV thrombus 2021, tobacco use, and history of EtOH use who is being seen 11/17/2024 for the evaluation of chest pain.  High-sensitivity troponin peaked at 394 and has trended down to 286   Assessment & Plan    Expand All Collapse All    Cardiology Note     Patient ID: Donavyn Fecher Fate MRN: [id number]; DOB: 11-15-1966   Admit date: 11/16/2024 Date of Consult: 11/17/2024   PCP:  Carlette Benita Area, MD              [facility]  Providers Cardiologist:  Alvan Carrier, MD          Patient Profile: Christopher Cross is a 58 y.o. male with a hx of T2DM, HTN, asthma/COPD, non-ischemic HFrEF (LVEF30-35%), history of LV thrombus 2021, tobacco use, and history of EtOH use who is being seen 11/17/2024 for the evaluation of chest pain.  High-sensitivity troponin peaked at 394 and has trended down to 286   Denies any further chest pain since admission.     Past Medical History:  Diagnosis Date   Asthma     Diabetes mellitus without complication (HCC)     Other emphysema (HCC)     Unspecified essential hypertension                 Past  Surgical History:  Procedure Laterality Date   RIGHT/LEFT HEART CATH AND CORONARY ANGIOGRAPHY N/A 11/13/2020    Procedure: RIGHT/LEFT HEART CATH AND CORONARY ANGIOGRAPHY;  Surgeon: Dann Candyce RAMAN, MD;  Location: [facility] INVASIVE CV LAB;  Service: Cardiovascular;  Laterality: N/A;          Home Medications:         Prior to Admission medications   Medication Sig Start Date End Date Taking? Authorizing Provider  atorvastatin  (LIPITOR) 40 MG tablet Take 1 tablet (40 mg total) by mouth every evening. 11/15/20   Yes Regalado, Belkys A, MD  EPINEPHrine (PRIMATENE MIST IN) Inhale 2 puffs into the lungs daily as needed (shortness of breath).     Yes [provider]  insulin  isophane & regular human (NOVOLIN  70/30 FLEXPEN RELION) (70-30) 100 UNIT/ML KwikPen Inject 40 Units into the skin 2 (two) times daily before a meal. Patient taking differently: Inject 50 Units into the skin daily. 11/15/20   Yes Regalado, Belkys A, MD  losartan  (COZAAR ) 100 MG tablet TAKE 1 TABLET BY MOUTH ONCE DAILY . APPOINTMENT REQUIRED FOR FUTURE REFILLS 05/20/22   Yes Branch, Carrier FALCON, MD  metFORMIN  (GLUCOPHAGE ) 1000 MG tablet Take 1 tablet (1,000 mg total) 2 (two) times daily by mouth. Patient taking differently: Take 1,000 mg by mouth 2 (two) times daily with a meal. 10/22/17   Yes Lynwood Anes, MD  metoprolol  succinate (TOPROL -XL) 50 MG 24 hr tablet TAKE 1 TABLET BY MOUTH ONCE DAILY . APPOINTMENT REQUIRED FOR FUTURE REFILLS 05/20/22   Yes Branch, Carrier FALCON, MD  nitroGLYCERIN  (NITROSTAT ) 0.4 MG SL tablet Place 1 tablet (0.4 mg total) under the tongue every 5 (five) minutes as needed for chest pain. 11/15/20   Yes Regalado, Belkys A, MD  spironolactone  (ALDACTONE ) 50 MG tablet Take 50 mg by mouth every morning. 11/10/24   Yes [provider]  blood glucose meter kit and supplies Dispense based on patient and insurance preference. Use up to four times daily as directed. (FOR ICD-10 E10.9, E11.9). 11/15/20      Regalado, Owen A, MD  Insulin  Pen Needle (PEN NEEDLES 3/16) 31G X 5 MM MISC 1 application by Does not apply route 2 (two) times daily. 11/15/20     Regalado, Owen A, MD      Scheduled Meds:  aspirin  EC  81 mg Oral Daily   atorvastatin   40 mg Oral QPM   folic acid   1 mg Oral Daily   [START ON 11/18/2024] influenza vac split trivalent PF  0.5 mL Intramuscular Tomorrow-1000   insulin  isophane & regular human KwikPen  25 Units Subcutaneous BID AC   LORazepam   0-4 mg Oral Q6H    Followed by   NOREEN  ON 11/19/2024] LORazepam   0-4 mg Oral Q12H   multivitamin with minerals  1 tablet Oral Daily   nicotine   21 mg Transdermal Once   spironolactone   50 mg Oral Daily   thiamine   100 mg Oral Daily    Or   thiamine   100 mg Intravenous Daily        Continuous Infusions:  dextrose  5% lactated ringers  125 mL/hr at 11/17/24 0627   heparin  1,250 Units/hr (11/17/24 0600)   insulin  5.5 Units/hr (11/17/24 0635)   lactated ringers           PRN Meds: dextrose , LORazepam  **OR** LORazepam , nitroGLYCERIN        Allergies:    Allergies       Allergies  Allergen Reactions   Xarelto  [Rivaroxaban ] Other (See Comments)      hematuria        Social History:   Social History         Socioeconomic History   Marital status: Legally Separated      Spouse name: Not on file   Number of children: Not on file   Years of education: Not on file   Highest education level: Not on file  Occupational History   Not on file  Tobacco Use   Smoking status: Every Day      Current packs/day: 2.00      Average packs/day: 2.0 packs/day for 8.9 years (17.8 ttl pk-yrs)      Types: Cigarettes      Start date: 12/21/2015   Smokeless tobacco: Never   Tobacco comments:      started back after about 15 days   Vaping Use   Vaping status: Never Used  Substance and Sexual Activity   Alcohol use: Yes      Alcohol/week: 0.0 standard drinks of alcohol      Comment: daily   Drug use: No   Sexual activity: Not on  file  Other Topics Concern   Not on file  Social History Narrative   Not on file    Social Drivers of Health        Financial Resource Strain: Not on file  Food Insecurity: No Food Insecurity (11/17/2024)    Hunger Vital Sign     Worried About Running Out of Food in the Last Year: Never true     Ran Out of Food in the Last Year: Never true  Transportation Needs: No Transportation Needs (11/17/2024)    PRAPARE - Therapist, Art (Medical): No     Lack of Transportation (Non-Medical): No  Physical Activity: Not on file  Stress: Not on file  Social Connections: Not on file  Intimate Partner Violence: Unknown (11/17/2024)    Humiliation, Afraid, Rape, and Kick questionnaire     Fear of Current or Ex-Partner: No     Emotionally Abused: No     Physically Abused: Not on file     Sexually Abused: Not on file    Family History:          Family History  Problem Relation Age of Onset   Heart attack Other          Grandfather   Heart attack Other          Uncle          ROS:  Please see the history of present illness.    All other ROS reviewed and negative.      Physical Exam/Data:  Vitals:    11/17/24 0200 11/17/24 0300 11/17/24 0607 11/17/24 0728  BP: 115/76 (!) 124/93 91/68 (!) 117/92  Pulse: 89 95 85 95  Resp: 16 16 16 13   Temp:   98.1 F (36.7 C)   98.1 F (36.7 C)  TempSrc:   Oral   Oral  SpO2: 94% 96% 94% 95%  Weight:          Height:              Intake/Output Summary (Last 24 hours) at 11/17/2024 0800 Last data filed at 11/17/2024 9364    Gross per 24 hour  Intake 1143.89 ml  Output 1000 ml  Net 143.89 ml        11/16/2024   11:42 PM 11/16/2024    5:18 PM 06/28/2024    1:17 PM  Last 3 Weights  Weight (lbs) 198 lb 10.2 oz 200 lb 1.6 oz 220 lb  Weight (kg) 90.1 kg 90.765 kg 99.791 kg     Body mass index is 29.33 kg/m.  GEN: Well nourished, well developed in no acute distress HEENT: Normal NECK: No JVD; No  carotid bruits LYMPHATICS: No lymphadenopathy CARDIAC:RRR, no murmurs, rubs, gallops RESPIRATORY:  Clear to auscultation without rales, wheezing or rhonchi  ABDOMEN: Soft, non-tender, non-distended MUSCULOSKELETAL:  No edema; No deformity  SKIN: Warm and dry NEUROLOGIC:  Alert and oriented x 3 PSYCHIATRIC:  Normal affect      Relevant CV Studies: [facility] 10/2020: LV end diastolic pressure is mildly elevated. LVEDP 22 mm Hg. There is no aortic valve stenosis. No angiographically apparent CAD. Ao 98%, PA sat 77%, PA pressure 33/19, mean PA 26 mm Hg; PCWP 19 mm Hg; CO 6.7 L/min; CI 3.2 Hemodynamic findings consistent with mild pulmonary hypertension.   TTE 10/2020:  1. Left ventricular ejection fraction, by estimation, is 30 to 35%. The  left ventricle has moderately decreased function. The left ventricle  demonstrates regional wall motion abnormalities (see scoring  diagram/findings for description). Suggestive of  possible stress induced cardiomyopathy although ischemic cardiomyopathy is  not excluded. There is mild left ventricular hypertrophy. Left ventricular  diastolic parameters are indeterminate. Definity  contrast utilzed with  evidence of apical LV thrombus,  some of which is loosely organized.   2. Right ventricular systolic function is normal. The right ventricular  size is normal. Tricuspid regurgitation signal is inadequate for assessing  PA pressure.   3. The mitral valve is grossly normal. Trivial mitral valve  regurgitation.   4. The aortic valve is tricuspid. Aortic valve regurgitation is not  visualized.   5. The inferior vena cava is normal in size with greater than 50%  respiratory variability, suggesting right atrial pressure of 3 mmHg.    Laboratory Data: High Sensitivity Troponin:   Last Labs      Recent Labs  Lab 11/17/24 0136 11/17/24 0502  TROPONINIHS 394* 286*       Chemistry Last Labs      Recent Labs  Lab 11/16/24 1752 11/16/24 2205  NA  135 135  K 4.0 3.7  CL 97* 100  CO2 19* 23  GLUCOSE 364* 197*  BUN 13 11  CREATININE 0.81 0.71  CALCIUM  9.4 8.9  GFRNONAA >60 >60  ANIONGAP 18* 12      Last Labs  No results for input(s): PROT, ALBUMIN, AST, ALT, ALKPHOS, BILITOT in the last 168 hours.   Lipids  Last Labs     Recent Labs  Lab 11/17/24 0136  CHOL 146  TRIG 161*  HDL 55  LDLCALC 59  CHOLHDL 2.7      Hematology Last Labs     Recent Labs  Lab 11/16/24 1752  WBC 13.8*  RBC 4.76  HGB 15.9  HCT 43.9  MCV 92.2  MCH 33.4  MCHC 36.2*  RDW 11.4*  PLT 199      Thyroid  Last Labs  No results for input(s): TSH, FREET4 in the last 168 hours.    BNP Last Labs  No results for input(s): BNP, PROBNP in the last 168 hours.    DDimer  Last Labs  No results for input(s): DDIMER in the last 168 hours.     Radiology/Studies:  CT Angio Chest/Abd/Pel for Dissection W and/or Wo Contrast Result Date: 11/16/2024 EXAM: CTA CHEST, ABDOMEN AND PELVIS WITHOUT AND WITH CONTRAST 11/16/2024 07:28:28 PM TECHNIQUE: CTA of the chest was performed without and with the administration of 100 mL of iohexol  (OMNIPAQUE ) 350 MG/ML injection. CTA of the abdomen and pelvis was performed without and with the administration of 100 mL of iohexol  (OMNIPAQUE ) 350 MG/ML injection. Multiplanar reformatted images are provided for review. MIP images are provided for review. Automated exposure control, iterative reconstruction, and/or weight based adjustment of the mA/kV was utilized to reduce the radiation dose to as low as reasonably achievable. COMPARISON: Same day x-ray. CTA chest 11/10/2020. CT abdomen and pelvis 07/22/2020. CLINICAL HISTORY: Aortic aneurysm suspected. FINDINGS: VASCULATURE: AORTA: Aortic atherosclerotic calcification. No acute aortic syndrome. No abdominal aortic aneurysm. No dissection. PULMONARY ARTERIES: Negative for pulmonary embolism. GREAT VESSELS OF AORTIC ARCH: No acute finding. No dissection.  No arterial occlusion or significant stenosis. CELIAC TRUNK: No acute finding. No occlusion or significant stenosis. SUPERIOR MESENTERIC ARTERY: No acute finding. No occlusion or significant stenosis. INFERIOR MESENTERIC ARTERY: No acute finding. No occlusion or significant stenosis. RENAL ARTERIES: No acute finding. No occlusion or significant stenosis. ILIAC ARTERIES: Low-density atherosclerotic plaque in the right common iliac artery causes mild narrowing. No acute finding. No occlusion or significant stenosis. CHEST: MEDIASTINUM: Coronary artery and aortic atherosclerotic calcification. No mediastinal lymphadenopathy. The heart and pericardium demonstrate no acute abnormality. LUNGS AND PLEURA: Mild centrilobular and paraseptal emphysema. Clustered centrilobular micronodules in the left lower lobe compatible with mild small airway infection / inflammation. No focal consolidation or pulmonary edema. No evidence of pleural effusion or pneumothorax. THORACIC BONES AND SOFT TISSUES: No acute bone or soft tissue abnormality. ABDOMEN AND PELVIS: LIVER: Hepatic steatosis. GALLBLADDER AND BILE DUCTS: Gallbladder is unremarkable. No biliary ductal dilatation. SPLEEN: The spleen is unremarkable. PANCREAS: The pancreas is unremarkable. ADRENAL GLANDS: Bilateral adrenal glands demonstrate no acute abnormality. KIDNEYS, URETERS AND BLADDER: 9 mm stone in the lower pole of the right kidney. No hydronephrosis or obstructive calculi. No perinephric or periureteral stranding. Urinary bladder is unremarkable. GI AND BOWEL: Stomach and duodenal sweep demonstrate no acute abnormality. Normal appendix. There is no bowel obstruction. No abnormal bowel wall thickening or distension. REPRODUCTIVE: Reproductive organs are unremarkable. PERITONEUM AND RETROPERITONEUM: No ascites or free air. LYMPH NODES: No lymphadenopathy. ABDOMINAL BONES AND SOFT TISSUES: Fat-containing right inguinal hernia. No acute abnormality of the bones. No acute  soft tissue abnormality. IMPRESSION: 1. No acute aortic syndrome. 2. Mild centrilobular and paraseptal emphysema with clustered centrilobular micronodules in the left lower lobe compatible with mild small airway infection/inflammation. 3. 9 mm nonobstructing right lower pole renal calculus. 4. Hepatic steatosis. Electronically signed by: Norman Gatlin MD 11/16/2024 07:38 PM EST RP Workstation: [id number]    DG Chest Port 1  View Result Date: 11/16/2024 EXAM: 1 VIEW(S) XRAY OF THE CHEST 11/16/2024 06:13:20 PM COMPARISON: 08/24/2023 CLINICAL HISTORY: Chest pain FINDINGS: LUNGS AND PLEURA: No focal pulmonary opacity. No pleural effusion. No pneumothorax. HEART AND MEDIASTINUM: No acute abnormality of the cardiac and mediastinal silhouettes. BONES AND SOFT TISSUES: No acute osseous abnormality. IMPRESSION: 1. No acute cardiopulmonary pathology. Electronically signed by: Norman Gatlin MD 11/16/2024 06:31 PM EST RP Workstation: [id number]        Assessment and Plan: NSTEMI Hyperlipidemia Presents with an episode of sharp, retrosternal chest pain with radiation to the bilateral upper extremities and shortness of breath.  Symptoms lasted for about 15 minutes and resolved with SL NG. Mildly elevated but relatively flat troponin trend (troponin 111 =>131 =>122=> 394=> 286).  EKG with no acute ischemic changes.  His symptoms are concerning for a cardiac event. He is now being treated for mild DKA which possibly could have resulted in elevated troponin trend from demand ischemia but given his overall symptoms and resolution with nitroglycerin  need to assume this is type I NSTEMI until further evaluation.   -2D echo 11/17/2024: EF 20 to 25% with mid to apical akinesis of the septal wall, anterior septal wall, inferoseptal wall, anterior wall, anterior lateral wall and apical inferior lateral wall segments with hyperkinesis of the basal segments>> this could represent a stress cardiomyopathy  -Lipids this admission  showed an LDL 59, HDL 55 and triglycerides 161  -HbA1c pending -Cath this morning showed normal coronary arteries with LVEDP 19 mmHg -Toprol  XL 25 mg daily  -Stop ASA since we are starting on DOAC for possible early forming LV thrombus -Transition IV heparin  to Eliquis  5 mg twice daily starting tomorrow morning  -Continue atorvastatin  40 mg daily for diabetes and hyperlipidemia   HFrEF NICM possibly stress cardiomyopathy Last 2D echo 11/11/2020 showed EF 30 to 35% with possible stress cardiomyopathy given mid inferoseptal, mid anterolateral and apical akinesis as well as mid anterior/inferior hypokinesis with normal basal segments euvolemic with no signs of HF. - 2D echo this admission showed severe LV dysfunction EF 20 to 25% with multiple focal wall motion abnormalities in the mid to apical portions of the LV likely represents stress cardiomyopathy given normal coronary arteries on This morning - PTA he was on GDMT with losartan  and Lopressor  --he states that he had been compliant with his medication but it appears that both of these meds expired in 2024 with no subsequent refills. -Will transition ARB to Entresto 24-26 mg twice daily today -Continue Toprol  XL 25 mg daily and spironolactone  50 mg daily   History of LV thrombus - Previously on Xarelto  but stopped it due to hematuria; unclear when but patient believes it was stopped ~2 years ago. No recent bleeding issues - 2D echo concerning for possible early forming LV thrombus and clearly with akinesis of the entire apex is at higher risk.  Since this may be a stress cardiomyopathy will treat with Eliquis  in the short-term and repeat 2D echo in 2 months and if LV function is normalized then can stop the DOAC -Continue IV heparin  for now and transition to Eliquis  in the morning -If he should develop recurrent hematuria he would need urologic evaluation   Hypertension - BP controlled at 115/85 mmHg - He had been on losartan  and Lopressor  at  home but these have not been refilled since 2024 even though he says he has been taking them - Toprol  XL 25 mg daily and spironolactone  50 mg daily - Transition ARB  to Entresto 24-26 mg twice daily -BMP in a.m.  I spent 35 minutes caring for this patient today face to face, ordering and reviewing labs, reviewing records from 2D echo from 2021 and cardiac cath from 2021 and this morning, seeing the patient, documenting in the record      For questions or updates, please contact [facility] Please consult [email or web address] for contact info under        Signed, Wilbert Bihari, MD  11/18/2024, 8:17 AM

## 2024-11-18 NOTE — Progress Notes (Signed)
   Heart Failure Stewardship Pharmacist Progress Note   PCP: Carlette Benita Area, MD PCP-Cardiologist: Alvan Carrier, MD    HPI:  58 yo M with PMH of CHF, NICM (2021), COPD, T2DM, tobacco use, and alcohol use.   Presented to the ED on 11/29 with chest pain radiating to shoulders and mild shortness of breath. CXR with no acute findings. CTA with no acute aortic syndrome. Trop 111>>394. EKG with sinus rhythm, anterior and inferior Q waves. Has been out of insulin  and was in DKA on admission. ECHO on 11/30 with LVEF 20-25% (was 30-35% 10/2020), RWMA, RV normal, cannot rule out early forming mural apical LV thrombus. Taken for [facility] on 12/1 and found to have angiographically minimal CAD, LVEDP 19.   Denies shortness of breath, chest pain, lightheadedness, or dizziness. No LE edema on exam. Reviewed changes to GDMT and goals of therapy. He does not have any insurance. Reviewed options for [facility] and patient reports that it still is affordable for him. He is agreeable to using [facility] TOC pharmacy at discharge. He uses [employer] which supplies him the insulin  70/30 without a prescription at a discounted price.   Current HF Medications: Beta Blocker: metoprolol  XL 25 mg daily ACE/ARB/ARNI: Entresto 24/26 mg BID MRA: spironolactone  50 mg daily  Prior to admission HF Medications: Beta blocker: metoprolol  XL 50 mg daily ACE/ARB/ARNI: losartan  100 mg daily MRA: spironolactone  50 mg daily  Pertinent Lab Values: Serum creatinine 0.89, BUN 9, Potassium 3.7, Sodium 135, A1c 11.6   Vital Signs: Weight: 198 lbs (admission weight: 198 lbs) Blood pressure: 100/70s  Heart rate: 70-80s  I/O: net -1L yesterday; net -0.4L since admission  Medication Assistance / Insurance Benefits Check: Does the patient have prescription insurance?  No  Outpatient Pharmacy:  Prior to admission outpatient pharmacy: [facility] Is the patient willing to use [facility] TOC pharmacy at discharge? Yes Is the patient  willing to transition their outpatient pharmacy to utilize a [facility] outpatient pharmacy?   No    Assessment: 1. Acute on chronic systolic CHF (LVEF 20-25%), due to NICM. NYHA class II symptoms. - Does not appear volume overloaded on exam - no diuretics - Continue metoprolol  XL 25 mg daily - Agree with transitioning from losartan  to Entresto 24/26 mg BID - Continue spironolactone  50 mg daily - can reduce to 25 mg daily if needed to maximize Entresto dose - No SGLT2i - A1c 11.6% and in DKA on admission  Plan: 1) Medication changes recommended at this time: - Agree with changes - Decrease spironolactone  to 25 mg daily at follow up to increase Entresto dose  2) Patient assistance: - Uninsured  - Patient reports copay for generic Sim will be affordable - can get GoodRx coupon vs [facility] pharmacy for $30-50 per month  3)  Education  - Patient has been educated on current HF medications and potential additions to HF medication regimen - Patient verbalizes understanding that over the next few months, these medication doses may change and more medications may be added to optimize HF regimen - Patient has been educated on basic disease state pathophysiology and goals of therapy   Duwaine Plant, PharmD, BCPS Heart Failure Engineer, Building Services Phone [phone]

## 2024-11-18 NOTE — Progress Notes (Signed)
 PROGRESS NOTE    Christopher Cross  FMW:[id number] DOB: [birth year] DOA: 11/16/2024 PCP: Carlette Benita Area, MD   Brief Narrative:  Christopher Cross is a 58 y.o. male who presents with acute onset chest pain after eating dinner, smoking, drinking alcohol.  He has a known history of uncontrolled insulin -dependent of Diabetes due to noncompliance, hypertension, asthma, coronary artery disease with history of MI, heart failure with reduced ejection fraction.  He also notes his chest pain is bilateral radiating into both arms which is consistent with his prior MI.  Of note patient recently ran out of insulin , has not refilled and presented with DKA.   Assessment & Plan:   Principal Problem:   NSTEMI (non-ST elevated myocardial infarction) (HCC) Active Problems:   HTN (hypertension)   Tobacco use   DKA (diabetic ketoacidosis) (HCC)   Alcohol use   Chronic HFrEF (heart failure with reduced ejection fraction) (HCC)   LV (left ventricular) mural thrombus   NICM (nonischemic cardiomyopathy) (HCC)  NSTEMI -Cardiology following, appreciate insight recommendations - Troponin remains elevated, unclear if primary etiology is cardiac versus DKA - Heparin  drip converting to p.o. DOAC, cardiac cath notes normal coronary arteries -Discontinue aspirin    DKA and uncontrolled insulin -dependent type 2 diabetes -Anion gap now closed, tolerating p.o., transition back to home insulin  70/30 twice daily dosing(had been taking this once daily but had recently run out of insulin ) - Continue sliding scale, hypoglycemic protocol  Chronic alcohol use CIWA ongoing, discussed cessation at length Hypertension Blood pressure controlled Asthma/COPD, ongoing tobacco abuse -discussed cessation at length  DVT prophylaxis: Heparin  drip Code Status:   Code Status: Full Code Family Communication: None present  Status is: Inpatient  Dispo: The patient is from: Home              Anticipated d/c is to: Home               Anticipated d/c date is: 24 to 48 hours              Patient currently not medically stable for discharge  Consultants:  Cardiology  Procedures:  None  Antimicrobials:  None  Subjective: No acute issues or events overnight, denies nausea vomiting diarrhea constipation any fevers chills or chest pain.  Tolerated cath quite well no complaints at this time  Objective: Vitals:   11/17/24 1919 11/17/24 2133 11/17/24 2336 11/18/24 0429  BP: 111/81  101/73 100/67  Pulse: 85  84 85  Resp: 16  19 18   Temp: 98.3 F (36.8 C)  97.6 F (36.4 C) 98.6 F (37 C)  TempSrc: Oral  Oral Oral  SpO2: 93%  96% 92%  Weight:  89.9 kg    Height:        Intake/Output Summary (Last 24 hours) at 11/18/2024 0703 Last data filed at 11/18/2024 0451 Gross per 24 hour  Intake 980 ml  Output 2050 ml  Net -1070 ml   Filed Weights   11/16/24 1718 11/16/24 2342 11/17/24 2133  Weight: 90.8 kg 90.1 kg 89.9 kg    Examination:  General:  Pleasantly resting in bed, No acute distress. HEENT:  Normocephalic atraumatic.  Sclerae nonicteric, noninjected.  Extraocular movements intact bilaterally. Neck:  Without mass or deformity.  Trachea is midline. Lungs:  Clear to auscultate bilaterally without rhonchi, wheeze, or rales. Heart:  Regular rate and rhythm.  Without murmurs, rubs, or gallops. Abdomen:  Soft, nontender, nondistended.  Without guarding or rebound. Extremities: Without cyanosis, clubbing, edema, or obvious deformity.  Skin:  Warm and dry, no erythema.  Data Reviewed: I have personally reviewed following labs and imaging studies  CBC: Recent Labs  Lab 11/16/24 1752 11/18/24 0233  WBC 13.8* 9.7  HGB 15.9 13.5  HCT 43.9 37.9*  MCV 92.2 93.1  PLT 199 147*   Basic Metabolic Panel: Recent Labs  Lab 11/16/24 1752 11/16/24 2205 11/18/24 0233  NA 135 135 135  K 4.0 3.7 3.7  CL 97* 100 104  CO2 19* 23 25  GLUCOSE 364* 197* 253*  BUN 13 11 9   CREATININE 0.81 0.71 0.89  CALCIUM  9.4  8.9 8.4*   GFR: Estimated Creatinine Clearance: 102.8 mL/min (by C-G formula based on SCr of 0.89 mg/dL).  Coagulation Profile: Recent Labs  Lab 11/16/24 1752  INR 1.0   CBG: Recent Labs  Lab 11/17/24 1042 11/17/24 1142 11/17/24 1613 11/17/24 2129 11/18/24 0618  GLUCAP 275* 239* 314* 225* 252*   Lipid Profile: Recent Labs    11/17/24 0136  CHOL 146  HDL 55  LDLCALC 59  TRIG 161*  CHOLHDL 2.7   Recent Results (from the past 240 hours)  MRSA Next Gen by PCR, Nasal     Status: None   Collection Time: 11/16/24 11:38 PM   Specimen: Nasal Mucosa; Nasal Swab  Result Value Ref Range Status   MRSA by PCR Next Gen NOT DETECTED NOT DETECTED Final    Comment: (NOTE) The GeneXpert MRSA Assay (FDA approved for NASAL specimens only), is one component of a comprehensive MRSA colonization surveillance program. It is not intended to diagnose MRSA infection nor to guide or monitor treatment for MRSA infections. Test performance is not FDA approved in patients less than 3 years old. Performed at [facility], [address], [place], [place] [address]          Radiology Studies: ECHOCARDIOGRAM COMPLETE Result Date: 11/17/2024    ECHOCARDIOGRAM REPORT   Patient Name:   Christopher Cross Date of Exam: 11/17/2024 Medical Rec #:  [id number]     Height:       69.0 in Accession #:    [id number]    Weight:       198.6 lb Date of Birth:  11-Apr-1966     BSA:          2.060 m Patient Age:    58 years      BP:           117/86 mmHg Patient Gender: M             HR:           100 bpm. Exam Location:  Inpatient Procedure: 2D Echo (Both Spectral and Color Flow Doppler were utilized during            procedure). Indications:    chest pain  History:        Patient has prior history of Echocardiogram examinations, most                 recent 11/11/2020. Cardiomyopathy, COPD; Risk Factors:Current                 Smoker, Hypertension and Diabetes.  Sonographer:    Tinnie Barefoot RDCS Referring  Phys: [id number] HUSAM M [facility] IMPRESSIONS  1. Cannot rule out early forming mural apical LV thrombus. Left ventricular ejection fraction, by estimation, is 20 to 25%. The left ventricle has severely decreased function. The left ventricle demonstrates regional wall motion abnormalities (see scoring diagram/findings for description).  Left ventricular diastolic function could not be evaluated. There is akinesis of the left ventricular, mid-apical septal wall, anteroseptal wall, inferoseptal wall, anterior wall, inferior wall and anterolateral  wall. There is akinesis of the left ventricular, apical inferolateral wall. There is akinesis of the left ventricular, apical segment. Hyperkinesis of the basal segments     m\esine  2. Right ventricular systolic function is normal. The right ventricular size is normal. Tricuspid regurgitation signal is inadequate for assessing PA pressure.  3. The mitral valve is normal in structure. No evidence of mitral valve regurgitation. No evidence of mitral stenosis.  4. The aortic valve is normal in structure. Aortic valve regurgitation is not visualized. No aortic stenosis is present.  5. There is mild (Grade II) plaque involving the descending aorta.  6. The inferior vena cava is normal in size with greater than 50% respiratory variability, suggesting right atrial pressure of 3 mmHg. FINDINGS  Left Ventricle: Cannot rule out early forming mural apical LV thrombus. Left ventricular ejection fraction, by estimation, is 20 to 25%. The left ventricle has severely decreased function. The left ventricle demonstrates regional wall motion abnormalities. Definity  contrast agent was given IV to delineate the left ventricular endocardial borders. The left ventricular internal cavity size was normal in size. There is no left ventricular hypertrophy. Left ventricular diastolic function could not be evaluated. Right Ventricle: The right ventricular size is normal. No increase in right ventricular wall  thickness. Right ventricular systolic function is normal. Tricuspid regurgitation signal is inadequate for assessing PA pressure. Left Atrium: Left atrial size was normal in size. Right Atrium: Right atrial size was normal in size. Pericardium: There is no evidence of pericardial effusion. Mitral Valve: The mitral valve is normal in structure. No evidence of mitral valve regurgitation. No evidence of mitral valve stenosis. Tricuspid Valve: The tricuspid valve is normal in structure. Tricuspid valve regurgitation is not demonstrated. No evidence of tricuspid stenosis. Aortic Valve: The aortic valve is normal in structure. Aortic valve regurgitation is not visualized. No aortic stenosis is present. Pulmonic Valve: The pulmonic valve was normal in structure. Pulmonic valve regurgitation is not visualized. No evidence of pulmonic stenosis. Aorta: The aortic root is normal in size and structure. There is mild (Grade II) plaque involving the descending aorta. Venous: The inferior vena cava is normal in size with greater than 50% respiratory variability, suggesting right atrial pressure of 3 mmHg. IAS/Shunts: No atrial level shunt detected by color flow Doppler.  LEFT VENTRICLE PLAX 2D LVIDd:         5.20 cm   Diastology LVIDs:         4.60 cm   LV e' medial:  5.22 cm/s LV PW:         1.00 cm   LV e' lateral: 5.77 cm/s LV IVS:        1.00 cm LVOT diam:     2.30 cm LV SV:         44 LV SV Index:   22 LVOT Area:     4.15 cm LV IVRT:       106 msec  RIGHT VENTRICLE             IVC RV Basal diam:  2.30 cm     IVC diam: 1.80 cm RV S prime:     12.20 cm/s TAPSE (M-mode): 1.6 cm LEFT ATRIUM             Index        RIGHT ATRIUM  Index LA diam:        2.90 cm 1.41 cm/m   RA Area:     8.64 cm LA Vol (A2C):   53.3 ml 25.87 ml/m  RA Volume:   20.00 ml 9.71 ml/m LA Vol (A4C):   31.2 ml 15.15 ml/m LA Biplane Vol: 41.9 ml 20.34 ml/m  AORTIC VALVE LVOT Vmax:   74.60 cm/s LVOT Vmean:  48.200 cm/s LVOT VTI:    0.107 m  AORTA  Ao Root diam: 3.10 cm Ao Asc diam:  3.00 cm  SHUNTS Systemic VTI:  0.11 m Systemic Diam: 2.30 cm Wilbert Bihari MD Electronically signed by Wilbert Bihari MD Signature Date/Time: 11/17/2024/10:54:53 AM    Final    CT Angio Chest/Abd/Pel for Dissection W and/or Wo Contrast Result Date: 11/16/2024 EXAM: CTA CHEST, ABDOMEN AND PELVIS WITHOUT AND WITH CONTRAST 11/16/2024 07:28:28 PM TECHNIQUE: CTA of the chest was performed without and with the administration of 100 mL of iohexol  (OMNIPAQUE ) 350 MG/ML injection. CTA of the abdomen and pelvis was performed without and with the administration of 100 mL of iohexol  (OMNIPAQUE ) 350 MG/ML injection. Multiplanar reformatted images are provided for review. MIP images are provided for review. Automated exposure control, iterative reconstruction, and/or weight based adjustment of the mA/kV was utilized to reduce the radiation dose to as low as reasonably achievable. COMPARISON: Same day x-ray. CTA chest 11/10/2020. CT abdomen and pelvis 07/22/2020. CLINICAL HISTORY: Aortic aneurysm suspected. FINDINGS: VASCULATURE: AORTA: Aortic atherosclerotic calcification. No acute aortic syndrome. No abdominal aortic aneurysm. No dissection. PULMONARY ARTERIES: Negative for pulmonary embolism. GREAT VESSELS OF AORTIC ARCH: No acute finding. No dissection. No arterial occlusion or significant stenosis. CELIAC TRUNK: No acute finding. No occlusion or significant stenosis. SUPERIOR MESENTERIC ARTERY: No acute finding. No occlusion or significant stenosis. INFERIOR MESENTERIC ARTERY: No acute finding. No occlusion or significant stenosis. RENAL ARTERIES: No acute finding. No occlusion or significant stenosis. ILIAC ARTERIES: Low-density atherosclerotic plaque in the right common iliac artery causes mild narrowing. No acute finding. No occlusion or significant stenosis. CHEST: MEDIASTINUM: Coronary artery and aortic atherosclerotic calcification. No mediastinal lymphadenopathy. The heart and  pericardium demonstrate no acute abnormality. LUNGS AND PLEURA: Mild centrilobular and paraseptal emphysema. Clustered centrilobular micronodules in the left lower lobe compatible with mild small airway infection / inflammation. No focal consolidation or pulmonary edema. No evidence of pleural effusion or pneumothorax. THORACIC BONES AND SOFT TISSUES: No acute bone or soft tissue abnormality. ABDOMEN AND PELVIS: LIVER: Hepatic steatosis. GALLBLADDER AND BILE DUCTS: Gallbladder is unremarkable. No biliary ductal dilatation. SPLEEN: The spleen is unremarkable. PANCREAS: The pancreas is unremarkable. ADRENAL GLANDS: Bilateral adrenal glands demonstrate no acute abnormality. KIDNEYS, URETERS AND BLADDER: 9 mm stone in the lower pole of the right kidney. No hydronephrosis or obstructive calculi. No perinephric or periureteral stranding. Urinary bladder is unremarkable. GI AND BOWEL: Stomach and duodenal sweep demonstrate no acute abnormality. Normal appendix. There is no bowel obstruction. No abnormal bowel wall thickening or distension. REPRODUCTIVE: Reproductive organs are unremarkable. PERITONEUM AND RETROPERITONEUM: No ascites or free air. LYMPH NODES: No lymphadenopathy. ABDOMINAL BONES AND SOFT TISSUES: Fat-containing right inguinal hernia. No acute abnormality of the bones. No acute soft tissue abnormality. IMPRESSION: 1. No acute aortic syndrome. 2. Mild centrilobular and paraseptal emphysema with clustered centrilobular micronodules in the left lower lobe compatible with mild small airway infection/inflammation. 3. 9 mm nonobstructing right lower pole renal calculus. 4. Hepatic steatosis. Electronically signed by: Norman Gatlin MD 11/16/2024 07:38 PM EST RP Workstation: [id number]   DG  Chest Port 1 View Result Date: 11/16/2024 EXAM: 1 VIEW(S) XRAY OF THE CHEST 11/16/2024 06:13:20 PM COMPARISON: 08/24/2023 CLINICAL HISTORY: Chest pain FINDINGS: LUNGS AND PLEURA: No focal pulmonary opacity. No pleural  effusion. No pneumothorax. HEART AND MEDIASTINUM: No acute abnormality of the cardiac and mediastinal silhouettes. BONES AND SOFT TISSUES: No acute osseous abnormality. IMPRESSION: 1. No acute cardiopulmonary pathology. Electronically signed by: Norman Gatlin MD 11/16/2024 06:31 PM EST RP Workstation: [id number]        Scheduled Meds:  aspirin  EC  81 mg Oral Daily   atorvastatin   40 mg Oral QPM   folic acid   1 mg Oral Daily   influenza vac split trivalent PF  0.5 mL Intramuscular Tomorrow-1000   insulin  aspart  0-15 Units Subcutaneous TID WC   insulin  aspart  0-5 Units Subcutaneous QHS   insulin  aspart protamine- aspart  35 Units Subcutaneous BID WC   LORazepam   0-4 mg Oral Q6H   Followed by   NOREEN ON 11/19/2024] LORazepam   0-4 mg Oral Q12H   metoprolol  succinate  25 mg Oral Daily   multivitamin with minerals  1 tablet Oral Daily   nicotine   14 mg Transdermal Daily   spironolactone   50 mg Oral Daily   thiamine   100 mg Oral Daily   Or   thiamine   100 mg Intravenous Daily   Continuous Infusions:  heparin  1,350 Units/hr (11/17/24 1517)     LOS: 2 days   Time spent:  Elsie JAYSON Mont[place], DO [facility]  If 7PM-7AM, please contact night-coverage [email or web address]  11/18/2024, 7:03 AM

## 2024-11-18 NOTE — Progress Notes (Signed)
 Heart Failure Nurse Navigator Progress Note  PCP: Carlette Benita Area, MD PCP-Cardiologist: Branch Admission Diagnosis: NSTEMI, Hyperglycemia.  Admitted from: Home  Presentation:   Christopher Cross presented with centralized chest pain, with bilateral arm pain, mild shortness of breath. Due to financial issues not taking any of his medications. Currently with No Health insurance. BP 134/116, HR 104, CBG 364, Troponin 131, Chest x-ray negative for acute findings. CT angio dissection study negative for dissection and negative for PE. EKG with no acute ischemic changes. Heart cath 11/18/24 showed normal coronary arteries with LVEDP 19 mmHg given , no further chest pain.   Patient was educated on the sign and symptoms of heart failure, daily weights, when to call his doctor or go to the ED. Diet/ fluid restrictions, patient reported he drinks under 64 oz per day and that includes the 2 glasses of Brandy he drinks every night). Continued education on limiting his salt intake, taking all his medications as prescribed, reported to not having Insurance and being able to pay for them. HF Pharmacist working with patient to get him his medications. Patient verbalized his understanding of the education provided and his need to attend his medical appointments. A HF TOC appointment was scheduled for 11/22/2024 @ 10:30 am , for which his dad will drive him to appointment.    ECHO/ LVEF: 20-25%  Clinical Course:  Past Medical History:  Diagnosis Date   Asthma    Diabetes mellitus without complication (HCC)    Other emphysema (HCC)    Unspecified essential hypertension      Social History   Socioeconomic History   Marital status: Legally Separated    Spouse name: Not on file   Number of children: Not on file   Years of education: Not on file   Highest education level: Not on file  Occupational History   Not on file  Tobacco Use   Smoking status: Every Day    Current packs/day: 2.00    Average  packs/day: 2.0 packs/day for 8.9 years (17.8 ttl pk-yrs)    Types: Cigarettes    Start date: 12/21/2015   Smokeless tobacco: Never   Tobacco comments:    started back after about 15 days   Vaping Use   Vaping status: Never Used  Substance and Sexual Activity   Alcohol use: Yes    Alcohol/week: 0.0 standard drinks of alcohol    Comment: daily   Drug use: No   Sexual activity: Not on file  Other Topics Concern   Not on file  Social History Narrative   Not on file   Social Drivers of Health   Financial Resource Strain: Not on file  Food Insecurity: No Food Insecurity (11/17/2024)   Hunger Vital Sign    Worried About Running Out of Food in the Last Year: Never true    Ran Out of Food in the Last Year: Never true  Transportation Needs: No Transportation Needs (11/17/2024)   PRAPARE - Administrator, Civil Service (Medical): No    Lack of Transportation (Non-Medical): No  Physical Activity: Not on file  Stress: Not on file  Social Connections: Not on file   Education Assessment and Provision:  Detailed education and instructions provided on heart failure disease management including the following:  Signs and symptoms of Heart Failure When to call the physician Importance of daily weights Low sodium diet Fluid restriction Medication management Anticipated future follow-up appointments  Patient education given on each of the above topics.  Patient acknowledges understanding via teach back method and acceptance of all instructions.  Education Materials:  Living Better With Heart Failure Booklet, HF zone tool, & Daily Weight Tracker Tool.  Patient has scale at home: Yes Patient has pill box at home: Yes    High Risk Criteria for Readmission and/or Poor Patient Outcomes: Heart failure hospital admissions (last 6 months): 0  No Show rate: 4 %  Difficult social situation: No, lives with his parents and sister.  Demonstrates medication adherence: No, medication  costs a factor  Primary Language: English  Literacy level: Reading, writing, and comprehension.   Barriers of Care:   Diet/ fluid restrictions ( drinks to glasses of Brandy per day , salty foods)  Daily weights Smokes about 2 packs per day ( Might want to quit)   Considerations/Referrals:   Referral made to Heart Failure Pharmacist Stewardship: Yes Referral made to Heart Failure CSW/NCM TOC: NA Referral made to Heart & Vascular TOC clinic: Yes, 11/22/2024 @ 10:30 am.   Items for Follow-up on DC/TOC: Continued education on HF Diet/ fluid restrictions/ daily weights Smoking cessation   Stephane Haddock, BSN, RN Heart Failure Teacher, Adult Education Only

## 2024-11-18 NOTE — Progress Notes (Signed)
  MATCH MEDICATION ASSISTANCE CARD Pharmacies please call [phone] for claim processing assistance.  Rx BIN: [id number] Rx Group: [id number] Rx PCN: [id number] Relationship Code: 1 Person Code: 01  Patient ID (MRN): [id number]    Patient Name: Christopher Cross   Patient DOB Jul 08, 1966   Discharge Date:11/18/2024  Expiration Date:11/25/2024 (must be filled within 7 days of discharge)

## 2024-11-18 NOTE — Progress Notes (Signed)
 Patient requesting a breathing treatment. He says that he has a history of COPD and used to use inhalers at home on a regular basis until he lost his medical insurance.   MD paged to request prn breathing treatment.

## 2024-11-18 NOTE — Interval H&P Note (Signed)
 History and Physical Interval Note:  11/18/2024 7:40 AM  Christopher Cross  has presented today for surgery, with the diagnosis of ELEVATED TROPONIN / CARDIOMYOPATHY.  The various methods of treatment have been discussed with the patient and family. After consideration of risks, benefits and other options for treatment, the patient has consented to  Procedure(s): LEFT HEART CATH AND CORONARY ANGIOGRAPHY (N/A) PERCUTANEOUS CORONARY INTERVENTION   as a surgical intervention.  The patient's history has been reviewed, patient examined, no change in status, stable for surgery.  I have reviewed the patient's chart and labs.  Questions were answered to the patient's satisfaction.    Cath Lab Visit (complete for each Cath Lab visit)  Clinical Evaluation Leading to the Procedure:   ACS: Yes.    Non-ACS:    Anginal Classification: CCS III  Anti-ischemic medical therapy: Minimal Therapy (1 class of medications)  Non-Invasive Test Results: Equivocal test results  Prior CABG: No previous CABG    Alm [place]

## 2024-11-19 ENCOUNTER — Other Ambulatory Visit: Payer: Self-pay | Admitting: Student

## 2024-11-19 ENCOUNTER — Encounter (HOSPITAL_COMMUNITY): Payer: Self-pay | Admitting: Cardiology

## 2024-11-19 ENCOUNTER — Other Ambulatory Visit (HOSPITAL_COMMUNITY): Payer: Self-pay

## 2024-11-19 DIAGNOSIS — I5022 Chronic systolic (congestive) heart failure: Secondary | ICD-10-CM

## 2024-11-19 DIAGNOSIS — Z720 Tobacco use: Secondary | ICD-10-CM

## 2024-11-19 DIAGNOSIS — F1090 Alcohol use, unspecified, uncomplicated: Secondary | ICD-10-CM

## 2024-11-19 DIAGNOSIS — E1010 Type 1 diabetes mellitus with ketoacidosis without coma: Secondary | ICD-10-CM

## 2024-11-19 DIAGNOSIS — I2489 Other forms of acute ischemic heart disease: Secondary | ICD-10-CM

## 2024-11-19 LAB — BASIC METABOLIC PANEL WITH GFR
Anion gap: 9 (ref 5–15)
BUN: 11 mg/dL (ref 6–20)
CO2: 24 mmol/L (ref 22–32)
Calcium: 8.4 mg/dL — ABNORMAL LOW (ref 8.9–10.3)
Chloride: 104 mmol/L (ref 98–111)
Creatinine, Ser: 0.85 mg/dL (ref 0.61–1.24)
GFR, Estimated: >60 mL/min (ref >60)
Glucose, Bld: 212 mg/dL — ABNORMAL HIGH (ref 70–99)
Potassium: 3.6 mmol/L (ref 3.5–5.1)
Sodium: 137 mmol/L (ref 135–145)

## 2024-11-19 LAB — CBC
HCT: 38.6 % — ABNORMAL LOW (ref 39.0–52.0)
Hemoglobin: 13.6 g/dL (ref 13.0–17.0)
MCH: 32.9 pg (ref 26.0–34.0)
MCHC: 35.2 g/dL (ref 30.0–36.0)
MCV: 93.5 fL (ref 80.0–100.0)
Platelets: 156 K/uL (ref 150–400)
RBC: 4.13 MIL/uL — ABNORMAL LOW (ref 4.22–5.81)
RDW: 11.6 % (ref 11.5–15.5)
WBC: 8.5 K/uL (ref 4.0–10.5)
nRBC: 0 % (ref 0.0–0.2)

## 2024-11-19 LAB — GLUCOSE, CAPILLARY
Glucose-Capillary: 236 mg/dL — ABNORMAL HIGH (ref 70–99)
Glucose-Capillary: 278 mg/dL — ABNORMAL HIGH (ref 70–99)

## 2024-11-19 MED ORDER — BLOOD GLUCOSE METER KIT
PACK | 0 refills | Status: AC
  Filled 2024-11-19: qty 1, 30d supply, fill #0

## 2024-11-19 MED ORDER — APIXABAN 5 MG PO TABS
5.0000 mg | ORAL_TABLET | Freq: Two times a day (BID) | ORAL | 0 refills | Status: DC
  Filled 2024-11-19: qty 60, 30d supply, fill #0
  Filled 2024-11-19: qty 74, 30d supply, fill #0

## 2024-11-19 MED ORDER — SACUBITRIL-VALSARTAN 24-26 MG PO TABS
1.0000 | ORAL_TABLET | Freq: Two times a day (BID) | ORAL | 0 refills | Status: DC
  Filled 2024-11-19: qty 60, 30d supply, fill #0

## 2024-11-19 MED ORDER — SPIRONOLACTONE 50 MG PO TABS
50.0000 mg | ORAL_TABLET | Freq: Every day | ORAL | 0 refills | Status: DC
  Filled 2024-11-19: qty 30, 30d supply, fill #0

## 2024-11-19 MED ORDER — ATORVASTATIN CALCIUM 40 MG PO TABS
40.0000 mg | ORAL_TABLET | Freq: Every day | ORAL | 0 refills | Status: DC
  Filled 2024-11-19: qty 30, 30d supply, fill #0

## 2024-11-19 MED ORDER — NITROGLYCERIN 0.4 MG SL SUBL
0.4000 mg | SUBLINGUAL_TABLET | SUBLINGUAL | 0 refills | Status: AC | PRN
  Filled 2024-11-19: qty 25, 7d supply, fill #0

## 2024-11-19 MED ORDER — BLOOD GLUCOSE TEST VI STRP
1.0000 | ORAL_STRIP | Freq: Three times a day (TID) | 0 refills | Status: AC
  Filled 2024-11-19: qty 100, 30d supply, fill #0

## 2024-11-19 MED ORDER — PEN NEEDLES 3/16" 31G X 5 MM MISC: 1.0000 | Freq: Two times a day (BID) | 3 refills | Status: AC

## 2024-11-19 MED ORDER — INSULIN ASPART PROT & ASPART (70-30 MIX) 100 UNIT/ML PEN
35.0000 [IU] | PEN_INJECTOR | Freq: Two times a day (BID) | SUBCUTANEOUS | 0 refills | Status: AC
  Filled 2024-11-19: qty 21, 30d supply, fill #0

## 2024-11-19 MED ORDER — INSULIN PEN NEEDLE 32G X 4 MM MISC
0 refills | Status: AC
  Filled 2024-11-19: qty 100, 30d supply, fill #0

## 2024-11-19 MED ORDER — ADULT MULTIVITAMIN W/MINERALS CH
1.0000 | ORAL_TABLET | Freq: Every day | ORAL | 11 refills | Status: AC
  Filled 2024-11-19: qty 30, 30d supply, fill #0

## 2024-11-19 MED ORDER — APIXABAN 5 MG PO TABS
5.0000 mg | ORAL_TABLET | Freq: Two times a day (BID) | ORAL | Status: DC
  Administered 2024-11-19: 5 mg via ORAL
  Filled 2024-11-19: qty 1

## 2024-11-19 MED ORDER — LANCETS MISC
1.0000 | 0 refills | Status: AC
  Filled 2024-11-19: qty 100, 25d supply, fill #0

## 2024-11-19 MED ORDER — INFLUENZA VIRUS VACC SPLIT PF (FLUZONE) 0.5 ML IM SUSY
0.5000 mL | PREFILLED_SYRINGE | Freq: Once | INTRAMUSCULAR | Status: AC
  Administered 2024-11-19: 0.5 mL via INTRAMUSCULAR

## 2024-11-19 MED ORDER — ACCU-CHEK SOFTCLIX LANCET DEV KIT
1.0000 | PACK | Freq: Three times a day (TID) | 0 refills | Status: AC
  Filled 2024-11-19: qty 1, 30d supply, fill #0

## 2024-11-19 MED ORDER — METOPROLOL SUCCINATE ER 25 MG PO TB24
25.0000 mg | ORAL_TABLET | Freq: Every day | ORAL | 0 refills | Status: DC
  Filled 2024-11-19: qty 30, 30d supply, fill #0

## 2024-11-19 MED ORDER — BLOOD GLUCOSE MONITORING SUPPL DEVI
1.0000 | Freq: Three times a day (TID) | 0 refills | Status: AC
  Filled 2024-11-19: qty 1, fill #0

## 2024-11-19 NOTE — Discharge Instructions (Signed)
 [employer] Guide Inpatient Behavioral Health/Residential Substance Abuse Treatment - Adults The [employer]'s "[id number]" is a great source of information about community services available.  Access by dialing 2-1-1 from anywhere in [place] , or by website -  PooledIncome.pl.    (Updated 03/2016)   Crisis Assistance 24 hours a day     Services Offered       Area [employer] 24-hour crisis assistance: [phone] [place], [place]  [facility] 24-hour crisis assistance:639-856-5824 [place], [place]  [facility] 24-hour crisis assistance: 641-128-5997 [place], [place]  [facility] Access to Care Line 24-hour crisis assistance; 938-657-3636 All  [facility] 24-hour crisis response line: 630-336-9881 All    Other Local Resources (Updated 12/2015)   Inpatient Behavioral Health/Residential Substance Abuse Treatment Programs     Services          Address and Phone Number  [facility] ([facility])   14-day residential rehabilitation  [phone] [address] [place], [place]  [facility] ([facility])    Detox - private pay only 14-day residential rehabilitation -  Medicaid, insurance, private pay only [phone], or [phone] [address] [place], [place] [address]   [facility] only Multiple facilities [phone] admissions    [facility] ([facility])   90-day program Must be homeless to participate   [phone], or [phone] Daniel Mcalpine, [facility]  [facility] only [phone], or  [phone] [address] [place], [place] [address]  [facility] Residential Treatment Services Must make an appointment Accepts private pay, Ursula Mosses [facility] [phone]  [address] [place], [place] [address]   [facility] Females only Associated with the [facility] [phone] [address] [place], [place] [address]  [facility] only [phone], or [phone] [address] [place], [id number]  [facility]     Detox Residential rehabilitation Private insurance only Multiple locations [phone] admissions  [facility]     Private pay Private insurance [phone] [address] [place], [place] [address]  [facility]     Males only Fee required at time of admission [phone] [address] [place], [place] [address]  [facility]     Private pay only   [phone] [address] [place], [place]  RTS (Residential Firefighter)    Detox - private pay, Medicaid Residential rehabilitation for males  - Medicare, Medicaid, insurance, private pay [phone] [address] [place], [place]   [facility]              Walk-in interviews Monday - Saturday from 8 am - 4 pm Individuals with legal charges are not eligible [phone] [address] [place], [place] [address]  [facility]  Must be willing to work Must attend Alcoholics Anonymous meetings [phone] [address] [place], [place]   [facility] [employer]    Faith-based program Private pay only [phone] [address] [place], [place]                                        Outpatient Substance Abuse  Treatment- uninsured  Narcotics Anonymous 24-HOUR HELPLINE Pre-recorded for Meeting Schedules [facility] [phone]  [email or web address] ALCOHOLICS ANONYMOUS  [place]    Answering Service [phone] Please Note: All [place]  Meetings are Non-smoking FindSpice.es  Alcohol and Drug Services -  Insurance: [employer] /State funding/private insurance Methadone, suboxone/Intensive outpatient  [place]   [phone] Fax: [phone] [address] [place], [place], [address] [place] [phone] Fax: [phone]    [address] [place], [place], [address] ([address] [place], [place], [place], [place], [place], [place], [place], [place]) [facility] [email or web address]/ Accepts State funding/Medicaid Transitional housing, Intensive Outpatient Treatment, Outpatient treatment, Veterans Services  Phone: [phone] Fax: [phone] Address: [address], [place] [address]   [employer] of Care (http://[email or web address].info/) Insurance: Medicaid Case Management, Administrator, arts, Medication Management, Outpatient Therapy, Psychosocial Rehabilitation, Substance Abuse Intensive Outpatient  Phone: [phone] Fax: [phone] [address], [place], [place], [address]  [facility] Medicaid, most private insurance providers Types of Program: Individual/Group Therapy, Substance Abuse Treatment  Phone: [place] [phone] Fax: [phone] [address], [place], [place], [address] [place] [phone] [address], [address] [place], [place], [address]  [facility]  Medicaid Types of Program: SAIOP  Phone: [phone] Fax: [phone] [address] [place], [place], [place], [address] [facility] Medicaid/state funds Crisis line [phone] [facility] [phone] [place] [phone] [place] [phone]  [facility] [address];  [place], [place] [address] [phone]  Substance Abuse Intensive Outpatient Program [facility] [address] [place], [place] [address] [phone]- Substance abuse treatment  Successful Transitions  Insurance: [facility], [address], sliding scale Types of Program: substance abuse treatment, transportation assistance Phone: [phone] Fax: [phone] Address: [address], [place] [address] [facility] (TrendSwap.ch) Insurance: UHC, [place], [place], [place] of  [place] Program: addiction counseling, detoxification,  Phone: [phone]  Fax: [phone] Address: [address] [place], [place] [address]  [facility] (statewide facilities/programs) [address] (Medicaid/state funds) [place], [place] [address]                      http://[email or web address]/ [phone] Daniel mcalpine- [phone] [place]- [phone] [facility] [place] (2 Locations) (Medicaid/state funds) --[address]  walk in 8:30-12 and 1-2:30 [place], [id number]   [facility]- [phone] --[address] [place], [place] [address]  EY-[phone] walk in 8:30-12 and 2-3:30  [facility] state funds/medicaid [address] [place], [place] [address] [id number] [facility] (Suboxone clinic) Medicaid/state funds  [address]  [place], [place] [address] [phone]   [facility]  [address] [place], [place] [address]  [phone] (24 hours) [place]- [address] [place], [place] [address]  [phone] (24 hours) [place]- [address] [place] [phone] [place]- [address] [place] [phone] [place] [address] [place] [phone] [facility]- Medicaid and state funds  [place]- [address] [place], [place] [address] [phone] (24 hours) [place]- [address] [place], [place] [address] [phone] [facility]- [address] [place], [place] [address] [phone] (24 hours) [place] [address] [place], [place]  [address] [phone] [place]- [address] [facility] [address] [place] [phone]

## 2024-11-19 NOTE — Discharge Summary (Addendum)
 Physician Discharge Summary  Davarius Ridener Pedregon FMW:[id number] DOB: 14-Jan-1966 DOA: 11/16/2024  PCP: Fanta, Tesfaye Demissie, MD  Admit date: 11/16/2024 Discharge date: 11/19/2024  Admitted From: Home Disposition: Home  Recommendations for Outpatient Follow-up:  Follow up with PCP in 1-2 weeks Follow-up with cardiology as scheduled  Home Health: None Equipment/Devices: None  Discharge Condition: Stable CODE STATUS: Full Diet recommendation: Low-salt low-fat low-carb diet  Brief/Interim Summary: Christopher Cross is a 58 y.o. male who presents with acute onset chest pain after eating dinner, smoking, drinking alcohol.  He has a known history of uncontrolled insulin -dependent of Diabetes due to noncompliance, hypertension, asthma, coronary artery disease with history of MI, heart failure with reduced ejection fraction.  He also notes his chest pain is bilateral radiating into both arms which is consistent with his prior MI.  Of note patient recently ran out of insulin , has not refilled and presented with DKA.  Patient admitted as above with acute on chronic combined systolic and diastolic heart failure exacerbation, NSTEMI with active chest pain exacerbated by DKA in the setting of medication noncompliance as he had previously ran out of insulin  with no plan for refills.  At this time patient's DKA has resolved, his case was also evaluated by cardiology, underwent cardiac catheterization on 11/18/2024 with normal coronary arteries.  Per their expertise patient will be discharged on Eliquis  5 twice daily, aspirin  discontinued over concerns over cardiac thrombus noted on imaging.  At this time patient otherwise appears to be back to baseline without any further acute complaints, stable and agreeable for discharge home.  Lengthy discussion in regards to risks of appropriately mortality with ongoing noncompliance to medication and diet as well as ongoing tobacco abuse and alcohol use.  Discharge Diagnoses:   Principal Problem:   Acute on chronic combined systolic and diastolic CHF (congestive heart failure) (HCC) Active Problems:   HTN (hypertension)   Tobacco use   Demand ischemia (HCC)   DKA (diabetic ketoacidosis) (HCC)   Alcohol use   LV (left ventricular) mural thrombus   NICM (nonischemic cardiomyopathy) (HCC)  NSTEMI - Cardiology following, appreciate insight recommendations - Troponin elevation likely secondary to NSTEMI exacerbated by DKA, tobacco abuse, alcohol abuse. - Heparin  drip converting to p.o. DOAC(Eliquis  5 twice daily), cardiac cath notes normal coronary arteries -Discontinue aspirin    DKA and uncontrolled insulin -dependent type 2 diabetes - Continue home insulin  regimen as discussed, monitor for signs or symptoms of hypoglycemia   chronic alcohol use CIWA ongoing, discussed cessation at length Hypertension Blood pressure controlled Asthma/COPD, ongoing tobacco abuse -discussed cessation at length  Discharge Instructions  Discharge Instructions     AMB referral to Phase II Cardiac Rehabilitation   Complete by: As directed    Non-ischemic cardiomyopathy   Diagnosis:  Other Heart Failure (see criteria below if ordering Phase II)     Heart Failure Type: Chronic Systolic & Diastolic   After initial evaluation and assessments completed: Virtual Based Care may be provided alone or in conjunction with Phase 2 Cardiac Rehab based on patient barriers.: Yes   Intensive Cardiac Rehabilitation (ICR) [facility] location only OR Traditional Cardiac Rehabilitation (TCR) *If criteria for ICR are not met will enroll in [facility] only): Yes   Call MD for:  difficulty breathing, headache or visual disturbances   Complete by: As directed    Call MD for:  extreme fatigue   Complete by: As directed    Call MD for:  hives   Complete by: As directed  Call MD for:  persistant dizziness or light-headedness   Complete by: As directed    Call MD for:  persistant nausea and vomiting    Complete by: As directed    Call MD for:  severe uncontrolled pain   Complete by: As directed    Call MD for:  temperature >100.4   Complete by: As directed    Diet - low sodium heart healthy   Complete by: As directed    Increase activity slowly   Complete by: As directed       Allergies as of 11/19/2024       Reactions   Xarelto  [rivaroxaban ] Other (See Comments)   hematuria        Medication List     STOP taking these medications    losartan  100 MG tablet Commonly known as: COZAAR    NovoLIN  70/30 Kwikpen (70-30) 100 UNIT/ML KwikPen Generic drug: insulin  isophane & regular human KwikPen       TAKE these medications    Accu-Chek Softclix Lancet Dev Kit 1 each by Does not apply route in the morning, at noon, and at bedtime. May substitute to any manufacturer covered by patient's insurance.   apixaban  5 MG Tabs tablet Commonly known as: ELIQUIS  Take 1 tablet (5 mg total) by mouth 2 (two) times daily.   atorvastatin  40 MG tablet Commonly known as: LIPITOR Take 1 tablet (40 mg total) by mouth daily. Start taking on: November 20, 2024 What changed: when to take this   blood glucose meter kit and supplies Dispense based on patient and insurance preference. Use up to four times daily as directed. (FOR ICD-10 E10.9, E11.9).   Blood Glucose Monitoring Suppl Devi 1 each by Does not apply route in the morning, at noon, and at bedtime. May substitute to any manufacturer covered by patient's insurance.   BLOOD GLUCOSE TEST STRIPS Strp Use in the morning, at noon, and at bedtime. May substitute to any manufacturer covered by patient's insurance.   insulin  aspart protamine - aspart (70-30) 100 UNIT/ML FlexPen Commonly known as: NOVOLOG  70/30 MIX Inject 35 Units into the skin 2 (two) times daily with a meal.   Lancets Misc Use up to four times daily as directed. (FOR ICD-10 E10.9, E11.9).   metFORMIN  1000 MG tablet Commonly known as: GLUCOPHAGE  Take 1 tablet (1,000  mg total) 2 (two) times daily by mouth. What changed: when to take this   metoprolol  succinate 25 MG 24 hr tablet Commonly known as: TOPROL -XL Take 1 tablet (25 mg total) by mouth daily. Start taking on: November 20, 2024 What changed:  medication strength See the new instructions.   multivitamin with minerals Tabs tablet Take 1 tablet by mouth daily. Start taking on: November 20, 2024   nitroGLYCERIN  0.4 MG SL tablet Commonly known as: NITROSTAT  Place 1 tablet (0.4 mg total) under the tongue every 5 (five) minutes as needed for chest pain.   Pen Needles 3/16 31G X 5 MM Misc 1 application  by Does not apply route 2 (two) times daily. What changed: Another medication with the same name was added. Make sure you understand how and when to take each.   Insulin  Pen Needle 32G X 4 MM Misc Use as directed with insulin  What changed: You were already taking a medication with the same name, and this prescription was added. Make sure you understand how and when to take each.   PRIMATENE MIST IN Inhale 2 puffs into the lungs daily as needed (shortness of  breath).   sacubitril -valsartan  24-26 MG Commonly known as: ENTRESTO  Take 1 tablet by mouth 2 (two) times daily.   spironolactone  50 MG tablet Commonly known as: ALDACTONE  Take 1 tablet (50 mg total) by mouth daily. Start taking on: November 20, 2024 What changed: when to take this        Follow-up Information      [facility]. Go in 3 day(s).   Specialty: Cardiology Why: Hospital follow up 11/22/2024 @ 10:30 am PLEASE bring a current medication list to appointment FREE valet parking, Entrance C, off [address] [facility] entrance Contact information: [address] [place]  [address] [phone]        [place], [place], PA-C Follow up.   Specialty: Physician Assistant Why: TIME : 1:00 PM  PLEASE ARRIVE AT 12:45 PM DATE  :CLYDENE DIAN ALVINE  TUESDAY  PLEASE BRING ALL CURRENT MEDICATION,ID and  PROF-OF-INCOME Contact information: [address] [place] [address] [phone]                Allergies  Allergen Reactions   Xarelto  [Rivaroxaban ] Other (See Comments)    hematuria    Consultations: Cardiology   Procedures/Studies: CARDIAC CATHETERIZATION Result Date: 11/18/2024 Table formatting from the original result was not included. Images from the original result were not included. Dominance: Right  Angiographically minimal CAD. The LAD tapers to a relatively small caliber vessel by the mid vessel but does reach the apex. There is essentially equivalent of a high OM/RI and high 1st Diag branch and a nondominant LCx The RCA is large and dominant. Suspect that elevated troponin is more related to Exacerbation of Chronic Combined Systolic and Diastolic Heart Failure-and NOT non-STEMI/ACS Moderately elevated LVEDP of 19 mmHg. RECOMMENDATIONS   Anticipated discharge date to be determined.   Likely needs titration of GDMT for combined heart failure.   No indication for antiplatelet therapy at this time . Alm MICAEL Clay, MD, MS Alm Clay, M.D., M.S. Interventional Cardiologist [facility] Pager # [phone]   ECHOCARDIOGRAM COMPLETE Result Date: 11/17/2024    ECHOCARDIOGRAM REPORT   Patient Name:   PISTOL KESSENICH Harbeck Date of Exam: 11/17/2024 Medical Rec #:  [id number]     Height:       69.0 in Accession #:    [id number]    Weight:       198.6 lb Date of Birth:  1966/05/08     BSA:          2.060 m Patient Age:    58 years      BP:           117/86 mmHg Patient Gender: M             HR:           100 bpm. Exam Location:  Inpatient Procedure: 2D Echo (Both Spectral and Color Flow Doppler were utilized during            procedure). Indications:    chest pain  History:        Patient has prior history of Echocardiogram examinations, most                 recent 11/11/2020. Cardiomyopathy, COPD;  Risk Factors:Current                 Smoker, Hypertension and Diabetes.  Sonographer:    Tinnie Barefoot RDCS Referring Phys: [id number] GILLIAN HERO [facility] IMPRESSIONS  1. Cannot rule out early forming mural apical LV thrombus. Left ventricular ejection fraction, by estimation, is 20 to 25%. The left ventricle has severely decreased function. The left ventricle demonstrates regional wall motion abnormalities (see scoring diagram/findings for description). Left ventricular diastolic function could not be evaluated. There is akinesis of the left ventricular, mid-apical septal wall, anteroseptal wall, inferoseptal wall, anterior wall, inferior wall and anterolateral  wall. There is akinesis of the left ventricular, apical inferolateral wall. There is akinesis of the left ventricular, apical segment. Hyperkinesis of the basal segments     m\esine  2. Right ventricular systolic function is normal. The right ventricular size is normal. Tricuspid regurgitation signal is inadequate for assessing PA pressure.  3. The mitral valve is normal in structure. No evidence of mitral valve regurgitation. No evidence of mitral stenosis.  4. The aortic valve is normal in structure. Aortic valve regurgitation is not visualized. No aortic stenosis is present.  5. There is mild (Grade II) plaque involving the descending aorta.  6. The inferior vena cava is normal in size with greater than 50% respiratory variability, suggesting right atrial pressure of 3 mmHg. FINDINGS  Left Ventricle: Cannot rule out early forming mural apical LV thrombus. Left ventricular ejection fraction, by estimation, is 20 to 25%. The left ventricle has severely decreased function. The left ventricle demonstrates regional wall motion abnormalities. Definity  contrast agent was given IV to delineate the left ventricular endocardial borders. The left ventricular internal cavity size was normal in size. There is no left ventricular hypertrophy. Left ventricular diastolic  function could not be evaluated. Right Ventricle: The right ventricular size is normal. No increase in right ventricular wall thickness. Right ventricular systolic function is normal. Tricuspid regurgitation signal is inadequate for assessing PA pressure. Left Atrium: Left atrial size was normal in size. Right Atrium: Right atrial size was normal in size. Pericardium: There is no evidence of pericardial effusion. Mitral Valve: The mitral valve is normal in structure. No evidence of mitral valve regurgitation. No evidence of mitral valve stenosis. Tricuspid Valve: The tricuspid valve is normal in structure. Tricuspid valve regurgitation is not demonstrated. No evidence of tricuspid stenosis. Aortic Valve: The aortic valve is normal in structure. Aortic valve regurgitation is not visualized. No aortic stenosis is present. Pulmonic Valve: The pulmonic valve was normal in structure. Pulmonic valve regurgitation is not visualized. No evidence of pulmonic stenosis. Aorta: The aortic root is normal in size and structure. There is mild (Grade II) plaque involving the descending aorta. Venous: The inferior vena cava is normal in size with greater than 50% respiratory variability, suggesting right atrial pressure of 3 mmHg. IAS/Shunts: No atrial level shunt detected by color flow Doppler.  LEFT VENTRICLE PLAX 2D LVIDd:         5.20 cm   Diastology LVIDs:         4.60 cm   LV e' medial:  5.22 cm/s LV PW:         1.00 cm   LV e' lateral: 5.77 cm/s LV IVS:        1.00 cm LVOT diam:     2.30 cm LV SV:         44 LV SV Index:   22 LVOT Area:     4.15 cm LV IVRT:       106 msec  RIGHT VENTRICLE             IVC RV Basal diam:  2.30 cm     IVC  diam: 1.80 cm RV S prime:     12.20 cm/s TAPSE (M-mode): 1.6 cm LEFT ATRIUM             Index        RIGHT ATRIUM          Index LA diam:        2.90 cm 1.41 cm/m   RA Area:     8.64 cm LA Vol (A2C):   53.3 ml 25.87 ml/m  RA Volume:   20.00 ml 9.71 ml/m LA Vol (A4C):   31.2 ml 15.15 ml/m  LA Biplane Vol: 41.9 ml 20.34 ml/m  AORTIC VALVE LVOT Vmax:   74.60 cm/s LVOT Vmean:  48.200 cm/s LVOT VTI:    0.107 m  AORTA Ao Root diam: 3.10 cm Ao Asc diam:  3.00 cm  SHUNTS Systemic VTI:  0.11 m Systemic Diam: 2.30 cm Wilbert Bihari MD Electronically signed by Wilbert Bihari MD Signature Date/Time: 11/17/2024/10:54:53 AM    Final    CT Angio Chest/Abd/Pel for Dissection W and/or Wo Contrast Result Date: 11/16/2024 EXAM: CTA CHEST, ABDOMEN AND PELVIS WITHOUT AND WITH CONTRAST 11/16/2024 07:28:28 PM TECHNIQUE: CTA of the chest was performed without and with the administration of 100 mL of iohexol  (OMNIPAQUE ) 350 MG/ML injection. CTA of the abdomen and pelvis was performed without and with the administration of 100 mL of iohexol  (OMNIPAQUE ) 350 MG/ML injection. Multiplanar reformatted images are provided for review. MIP images are provided for review. Automated exposure control, iterative reconstruction, and/or weight based adjustment of the mA/kV was utilized to reduce the radiation dose to as low as reasonably achievable. COMPARISON: Same day x-ray. CTA chest 11/10/2020. CT abdomen and pelvis 07/22/2020. CLINICAL HISTORY: Aortic aneurysm suspected. FINDINGS: VASCULATURE: AORTA: Aortic atherosclerotic calcification. No acute aortic syndrome. No abdominal aortic aneurysm. No dissection. PULMONARY ARTERIES: Negative for pulmonary embolism. GREAT VESSELS OF AORTIC ARCH: No acute finding. No dissection. No arterial occlusion or significant stenosis. CELIAC TRUNK: No acute finding. No occlusion or significant stenosis. SUPERIOR MESENTERIC ARTERY: No acute finding. No occlusion or significant stenosis. INFERIOR MESENTERIC ARTERY: No acute finding. No occlusion or significant stenosis. RENAL ARTERIES: No acute finding. No occlusion or significant stenosis. ILIAC ARTERIES: Low-density atherosclerotic plaque in the right common iliac artery causes mild narrowing. No acute finding. No occlusion or significant stenosis.  CHEST: MEDIASTINUM: Coronary artery and aortic atherosclerotic calcification. No mediastinal lymphadenopathy. The heart and pericardium demonstrate no acute abnormality. LUNGS AND PLEURA: Mild centrilobular and paraseptal emphysema. Clustered centrilobular micronodules in the left lower lobe compatible with mild small airway infection / inflammation. No focal consolidation or pulmonary edema. No evidence of pleural effusion or pneumothorax. THORACIC BONES AND SOFT TISSUES: No acute bone or soft tissue abnormality. ABDOMEN AND PELVIS: LIVER: Hepatic steatosis. GALLBLADDER AND BILE DUCTS: Gallbladder is unremarkable. No biliary ductal dilatation. SPLEEN: The spleen is unremarkable. PANCREAS: The pancreas is unremarkable. ADRENAL GLANDS: Bilateral adrenal glands demonstrate no acute abnormality. KIDNEYS, URETERS AND BLADDER: 9 mm stone in the lower pole of the right kidney. No hydronephrosis or obstructive calculi. No perinephric or periureteral stranding. Urinary bladder is unremarkable. GI AND BOWEL: Stomach and duodenal sweep demonstrate no acute abnormality. Normal appendix. There is no bowel obstruction. No abnormal bowel wall thickening or distension. REPRODUCTIVE: Reproductive organs are unremarkable. PERITONEUM AND RETROPERITONEUM: No ascites or free air. LYMPH NODES: No lymphadenopathy. ABDOMINAL BONES AND SOFT TISSUES: Fat-containing right inguinal hernia. No acute abnormality of the bones. No acute soft tissue abnormality. IMPRESSION: 1. No acute aortic syndrome.  2. Mild centrilobular and paraseptal emphysema with clustered centrilobular micronodules in the left lower lobe compatible with mild small airway infection/inflammation. 3. 9 mm nonobstructing right lower pole renal calculus. 4. Hepatic steatosis. Electronically signed by: Norman Gatlin MD 11/16/2024 07:38 PM EST RP Workstation: [id number]   DG Chest Port 1 View Result Date: 11/16/2024 EXAM: 1 VIEW(S) XRAY OF THE CHEST 11/16/2024 06:13:20 PM  COMPARISON: 08/24/2023 CLINICAL HISTORY: Chest pain FINDINGS: LUNGS AND PLEURA: No focal pulmonary opacity. No pleural effusion. No pneumothorax. HEART AND MEDIASTINUM: No acute abnormality of the cardiac and mediastinal silhouettes. BONES AND SOFT TISSUES: No acute osseous abnormality. IMPRESSION: 1. No acute cardiopulmonary pathology. Electronically signed by: Norman Gatlin MD 11/16/2024 06:31 PM EST RP Workstation: [id number]     Subjective: No acute issues or events overnight denies nausea vomiting diarrhea constipation any fevers chills or chest pain   Discharge Exam: Vitals:   11/19/24 0747 11/19/24 1105  BP: 131/76 (!) 129/95  Pulse: (!) 101 81  Resp: 14 17  Temp: 97.8 F (36.6 C) 97.8 F (36.6 C)  SpO2: 97% 96%   Vitals:   11/18/24 2312 11/19/24 0435 11/19/24 0747 11/19/24 1105  BP: 108/72 104/73 131/76 (!) 129/95  Pulse: 86 84 (!) 101 81  Resp: 15 14 14 17   Temp: 98.1 F (36.7 C) 97.8 F (36.6 C) 97.8 F (36.6 C) 97.8 F (36.6 C)  TempSrc: Oral Oral Oral Oral  SpO2: 95% 96% 97% 96%  Weight:      Height:        General: Pt is alert, awake, not in acute distress Cardiovascular: RRR, S1/S2 +, no rubs, no gallops Respiratory: CTA bilaterally, no wheezing, no rhonchi Abdominal: Soft, NT, ND, bowel sounds + Extremities: no edema, no cyanosis    The results of significant diagnostics from this hospitalization (including imaging, microbiology, ancillary and laboratory) are listed below for reference.     Microbiology: Recent Results (from the past 240 hours)  MRSA Next Gen by PCR, Nasal     Status: None   Collection Time: 11/16/24 11:38 PM   Specimen: Nasal Mucosa; Nasal Swab  Result Value Ref Range Status   MRSA by PCR Next Gen NOT DETECTED NOT DETECTED Final    Comment: (NOTE) The GeneXpert MRSA Assay (FDA approved for NASAL specimens only), is one component of a comprehensive MRSA colonization surveillance program. It is not intended to diagnose MRSA  infection nor to guide or monitor treatment for MRSA infections. Test performance is not FDA approved in patients less than 93 years old. Performed at [facility], [address], [place], [place] [address]      Labs: BNP (last 3 results) No results for input(s): BNP in the last 8760 hours. Basic Metabolic Panel: Recent Labs  Lab 11/16/24 1752 11/16/24 2205 11/18/24 0233 11/19/24 0242  NA 135 135 135 137  K 4.0 3.7 3.7 3.6  CL 97* 100 104 104  CO2 19* 23 25 24   GLUCOSE 364* 197* 253* 212*  BUN 13 11 9 11   CREATININE 0.81 0.71 0.89 0.85  CALCIUM  9.4 8.9 8.4* 8.4*   Liver Function Tests: No results for input(s): AST, ALT, ALKPHOS, BILITOT, PROT, ALBUMIN in the last 168 hours. No results for input(s): LIPASE, AMYLASE in the last 168 hours. No results for input(s): AMMONIA in the last 168 hours. CBC: Recent Labs  Lab 11/16/24 1752 11/18/24 0233 11/19/24 0242  WBC 13.8* 9.7 8.5  HGB 15.9 13.5 13.6  HCT 43.9 37.9* 38.6*  MCV  92.2 93.1 93.5  PLT 199 147* 156   Cardiac Enzymes: No results for input(s): CKTOTAL, CKMB, CKMBINDEX, TROPONINI in the last 168 hours. BNP: Invalid input(s): POCBNP CBG: Recent Labs  Lab 11/18/24 1252 11/18/24 1556 11/18/24 2056 11/19/24 0631 11/19/24 1103  GLUCAP 327* 279* 235* 236* 278*   D-Dimer No results for input(s): DDIMER in the last 72 hours. Hgb A1c Recent Labs    11/16/24 1752 11/18/24 0233  HGBA1C 11.6* 11.7*   Lipid Profile Recent Labs    11/17/24 0136  CHOL 146  HDL 55  LDLCALC 59  TRIG 161*  CHOLHDL 2.7   Thyroid function studies No results for input(s): TSH, T4TOTAL, T3FREE, THYROIDAB in the last 72 hours.  Invalid input(s): FREET3 Anemia work up No results for input(s): VITAMINB12, FOLATE, FERRITIN, TIBC, IRON, RETICCTPCT in the last 72 hours. Urinalysis    Component Value Date/Time   COLORURINE STRAW (A) 11/11/2020 0400   APPEARANCEUR  CLEAR 11/11/2020 0400   LABSPEC 1.038 (H) 11/11/2020 0400   PHURINE 7.0 11/11/2020 0400   GLUCOSEU >=500 (A) 11/11/2020 0400   HGBUR SMALL (A) 11/11/2020 0400   BILIRUBINUR NEGATIVE 11/11/2020 0400   KETONESUR 5 (A) 11/11/2020 0400   PROTEINUR NEGATIVE 11/11/2020 0400   NITRITE NEGATIVE 11/11/2020 0400   LEUKOCYTESUR NEGATIVE 11/11/2020 0400   Sepsis Labs Recent Labs  Lab 11/16/24 1752 11/18/24 0233 11/19/24 0242  WBC 13.8* 9.7 8.5   Microbiology Recent Results (from the past 240 hours)  MRSA Next Gen by PCR, Nasal     Status: None   Collection Time: 11/16/24 11:38 PM   Specimen: Nasal Mucosa; Nasal Swab  Result Value Ref Range Status   MRSA by PCR Next Gen NOT DETECTED NOT DETECTED Final    Comment: (NOTE) The GeneXpert MRSA Assay (FDA approved for NASAL specimens only), is one component of a comprehensive MRSA colonization surveillance program. It is not intended to diagnose MRSA infection nor to guide or monitor treatment for MRSA infections. Test performance is not FDA approved in patients less than 8 years old. Performed at [facility], [address] [place], [place] [address]      Time coordinating discharge: Over 30 minutes  SIGNED:   Elsie JAYSON [place], DO [facility] 11/19/2024, 12:02 PM Pager   If 7PM-7AM, please contact night-coverage [email or web address]

## 2024-11-19 NOTE — TOC Progression Note (Signed)
 Transition of Care [facility]) - Progression Note    Patient Details  Name: [name] MRN: [id number] Date of Birth: [birth year]  Transition of Care [facility] CM/SW Contact  Lauraine FORBES Saa, LCSWA Phone Number: 11/19/2024, 10:15 AM  Clinical Narrative:     10:15 AM CSW provided substance use resources.  Expected Discharge Plan: Home/Self Care Barriers to Discharge: Continued Medical Work up               Expected Discharge Plan and Services In-house Referral: PCP / Management Consultant, Artist Discharge Planning Services: Follow-up appt scheduled, [facility]   Living arrangements for the past 2 months: Single Family Home Expected Discharge Date: 11/19/24                                     Social Drivers of Health (SDOH) Interventions SDOH Screenings   Food Insecurity: No Food Insecurity (11/17/2024)  Housing: Low Risk  (11/17/2024)  Transportation Needs: No Transportation Needs (11/17/2024)  Utilities: Not At Risk (11/17/2024)  Alcohol Screen: Low Risk  (11/18/2024)  Financial Resource Strain: High Risk (11/18/2024)  Tobacco Use: High Risk (11/17/2024)    Readmission Risk Interventions     No data to display

## 2024-11-19 NOTE — Plan of Care (Signed)
 Problem: Education: Goal: Ability to describe self-care measures that may prevent or decrease complications (Diabetes Survival Skills Education) will improve Outcome: Completed/Met Goal: Individualized Educational Video(s) Outcome: Completed/Met   Problem: Coping: Goal: Ability to adjust to condition or change in health will improve Outcome: Completed/Met   Problem: Fluid Volume: Goal: Ability to maintain a balanced intake and output will improve Outcome: Completed/Met   Problem: Health Behavior/Discharge Planning: Goal: Ability to identify and utilize available resources and services will improve Outcome: Completed/Met Goal: Ability to manage health-related needs will improve Outcome: Completed/Met   Problem: Metabolic: Goal: Ability to maintain appropriate glucose levels will improve Outcome: Completed/Met   Problem: Nutritional: Goal: Maintenance of adequate nutrition will improve Outcome: Completed/Met Goal: Progress toward achieving an optimal weight will improve Outcome: Completed/Met   Problem: Skin Integrity: Goal: Risk for impaired skin integrity will decrease Outcome: Completed/Met   Problem: Tissue Perfusion: Goal: Adequacy of tissue perfusion will improve Outcome: Completed/Met   Problem: Education: Goal: Ability to describe self-care measures that may prevent or decrease complications (Diabetes Survival Skills Education) will improve Outcome: Completed/Met Goal: Individualized Educational Video(s) Outcome: Completed/Met   Problem: Cardiac: Goal: Ability to maintain an adequate cardiac output will improve Outcome: Completed/Met   Problem: Health Behavior/Discharge Planning: Goal: Ability to identify and utilize available resources and services will improve Outcome: Completed/Met Goal: Ability to manage health-related needs will improve Outcome: Completed/Met   Problem: Fluid Volume: Goal: Ability to achieve a balanced intake and output will  improve Outcome: Completed/Met   Problem: Metabolic: Goal: Ability to maintain appropriate glucose levels will improve Outcome: Completed/Met   Problem: Nutritional: Goal: Maintenance of adequate nutrition will improve Outcome: Completed/Met Goal: Maintenance of adequate weight for body size and type will improve Outcome: Completed/Met   Problem: Respiratory: Goal: Will regain and/or maintain adequate ventilation Outcome: Completed/Met   Problem: Urinary Elimination: Goal: Ability to achieve and maintain adequate renal perfusion and functioning will improve Outcome: Completed/Met   Problem: Education: Goal: Knowledge of General Education information will improve Description: Including pain rating scale, medication(s)/side effects and non-pharmacologic comfort measures Outcome: Completed/Met   Problem: Health Behavior/Discharge Planning: Goal: Ability to manage health-related needs will improve Outcome: Completed/Met   Problem: Clinical Measurements: Goal: Ability to maintain clinical measurements within normal limits will improve Outcome: Completed/Met Goal: Will remain free from infection Outcome: Completed/Met Goal: Diagnostic test results will improve Outcome: Completed/Met Goal: Respiratory complications will improve Outcome: Completed/Met Goal: Cardiovascular complication will be avoided Outcome: Completed/Met   Problem: Activity: Goal: Risk for activity intolerance will decrease Outcome: Completed/Met   Problem: Nutrition: Goal: Adequate nutrition will be maintained Outcome: Completed/Met   Problem: Coping: Goal: Level of anxiety will decrease Outcome: Completed/Met   Problem: Elimination: Goal: Will not experience complications related to bowel motility Outcome: Completed/Met Goal: Will not experience complications related to urinary retention Outcome: Completed/Met   Problem: Pain Managment: Goal: General experience of comfort will improve and/or  be controlled Outcome: Completed/Met   Problem: Safety: Goal: Ability to remain free from injury will improve Outcome: Completed/Met   Problem: Skin Integrity: Goal: Risk for impaired skin integrity will decrease Outcome: Completed/Met   Problem: Education: Goal: Understanding of CV disease, CV risk reduction, and recovery process will improve Outcome: Completed/Met Goal: Individualized Educational Video(s) Outcome: Completed/Met   Problem: Activity: Goal: Ability to return to baseline activity level will improve Outcome: Completed/Met   Problem: Cardiovascular: Goal: Ability to achieve and maintain adequate cardiovascular perfusion will improve Outcome: Completed/Met Goal: Vascular access site(s) Level 0-1 will be maintained  Outcome: Completed/Met   Problem: Health Behavior/Discharge Planning: Goal: Ability to safely manage health-related needs after discharge will improve Outcome: Completed/Met   Problem: Education: Goal: Understanding of CV disease, CV risk reduction, and recovery process will improve Outcome: Completed/Met Goal: Individualized Educational Video(s) Outcome: Completed/Met    All appropriate for discharge. Jovanni Eckhart S Emmamae Mcnamara

## 2024-11-19 NOTE — Progress Notes (Signed)
   Heart Failure Stewardship Pharmacist Progress Note   PCP: Carlette Benita Area, MD PCP-Cardiologist: Alvan Carrier, MD    HPI:  58 yo M with PMH of CHF, NICM (2021), COPD, T2DM, tobacco use, and alcohol use.   Presented to the ED on 11/29 with chest pain radiating to shoulders and mild shortness of breath. CXR with no acute findings. CTA with no acute aortic syndrome. Trop 111>>394. EKG with sinus rhythm, anterior and inferior Q waves. Has been out of insulin  and was in DKA on admission. ECHO on 11/30 with LVEF 20-25% (was 30-35% 10/2020), RWMA, RV normal, cannot rule out early forming mural apical LV thrombus. Taken for [facility] on 12/1 and found to have angiographically minimal CAD, LVEDP 19.   Denies shortness of breath, chest pain, lightheadedness, or dizziness. Trace LE edema on exam. Hoping that increased mobility after discharge will help. Advised compression stockings to help. Reviewed changes to GDMT and goals of therapy. He does not have any insurance. Reviewed options for Entresto  and patient reports that it still is affordable for him. He is agreeable to using [facility] TOC pharmacy at discharge. He uses [employer] which supplies him the insulin  70/30 without a prescription at a discounted price.   Current HF Medications: Beta Blocker: metoprolol  XL 25 mg daily ACE/ARB/ARNI: Entresto  24/26 mg BID MRA: spironolactone  50 mg daily  Prior to admission HF Medications: Beta blocker: metoprolol  XL 50 mg daily ACE/ARB/ARNI: losartan  100 mg daily MRA: spironolactone  50 mg daily  Pertinent Lab Values: Serum creatinine 0.85, BUN 11, Potassium 3.6, Sodium 137, A1c 11.6   Vital Signs: Weight: 198 lbs (admission weight: 198 lbs) Blood pressure: 100-120/70s  Heart rate: 70-80s  I/O: net -1L yesterday; net -2.6L since admission  Medication Assistance / Insurance Benefits Check: Does the patient have prescription insurance?  No  Outpatient Pharmacy:  Prior to admission outpatient  pharmacy: [facility] Is the patient willing to use [facility] TOC pharmacy at discharge? Yes Is the patient willing to transition their outpatient pharmacy to utilize a [facility] outpatient pharmacy?   No    Assessment: 1. Acute on chronic systolic CHF (LVEF 20-25%), due to NICM. NYHA class II symptoms. - Does not appear volume overloaded on exam - no diuretics - Consider increasing back to PTA metoprolol  XL 50 mg daily - Continue Entresto  24/26 mg BID - Continue spironolactone  50 mg daily - can reduce to 25 mg daily if needed to maximize Entresto  dose - No SGLT2i - A1c 11.6% and in DKA on admission  Plan: 1) Medication changes recommended at this time: - Increase metoprolol  XL to 50 mg daily - Decrease spironolactone  to 25 mg daily at follow up to increase Entresto  dose  2) Patient assistance: - Uninsured  - Patient reports copay for generic Entresto  will be affordable - can get GoodRx coupon vs [facility] pharmacy for $30-50 per month  3)  Education  - Patient has been educated on current HF medications and potential additions to HF medication regimen - Patient verbalizes understanding that over the next few months, these medication doses may change and more medications may be added to optimize HF regimen - Patient has been educated on basic disease state pathophysiology and goals of therapy   Duwaine Plant, PharmD, BCPS Heart Failure Engineer, Building Services Phone [phone]

## 2024-11-19 NOTE — Progress Notes (Signed)
 Pharmacy Consult Note - Anticoagulation  Pharmacy Consult for Heparin  drip > apixaban  Indication: chest pain/ACS > possible LV thrombus Allergies  Allergen Reactions   Xarelto  [Rivaroxaban ] Other (See Comments)    hematuria    PATIENT MEASUREMENTS: Height: 5' 9 (175.3 cm) Weight: 89.9 kg (198 lb 3.1 oz) IBW/kg (Calculated) : 70.7 HEPARIN  DW (KG): 89.1  VITAL SIGNS: Temp: 97.8 F (36.6 C) (12/02 0747) Temp Source: Oral (12/02 0747) BP: 131/76 (12/02 0747) Pulse Rate: 101 (12/02 0747)  Recent Labs    11/16/24 1752 11/16/24 2205 11/17/24 1810 11/18/24 0233 11/19/24 0242  HGB 15.9  --   --  13.5 13.6  HCT 43.9  --   --  37.9* 38.6*  PLT 199  --   --  147* 156  APTT 30  --   --   --   --   LABPROT 13.3  --   --   --   --   INR 1.0  --   --   --   --   HEPARINUNFRC  --    < >  --  0.43  --   CREATININE 0.81   < >  --  0.89 0.85  TROPONINIHS  --    < > 162*  --   --    < > = values in this interval not displayed.    Estimated Creatinine Clearance: 107.6 mL/min (by C-G formula based on SCr of 0.85 mg/dL).  PAST MEDICAL HISTORY: Past Medical History:  Diagnosis Date   Asthma    Diabetes mellitus without complication (HCC)    Other emphysema (HCC)    Unspecified essential hypertension     Medications:  Medications Prior to Admission  Medication Sig Dispense Refill Last Dose/Taking   atorvastatin  (LIPITOR) 40 MG tablet Take 1 tablet (40 mg total) by mouth every evening. 30 tablet 3 11/16/2024 Evening   EPINEPHrine (PRIMATENE MIST IN) Inhale 2 puffs into the lungs daily as needed (shortness of breath).   11/16/2024 Evening   insulin  isophane & regular human (NOVOLIN  70/30 FLEXPEN RELION) (70-30) 100 UNIT/ML KwikPen Inject 40 Units into the skin 2 (two) times daily before a meal. (Patient taking differently: Inject 50 Units into the skin daily.) 15 mL 11 11/16/2024 Morning   losartan  (COZAAR ) 100 MG tablet TAKE 1 TABLET BY MOUTH ONCE DAILY . APPOINTMENT REQUIRED FOR  FUTURE REFILLS 5 tablet 0 11/16/2024 Evening   metFORMIN  (GLUCOPHAGE ) 1000 MG tablet Take 1 tablet (1,000 mg total) 2 (two) times daily by mouth. (Patient taking differently: Take 1,000 mg by mouth 2 (two) times daily with a meal.) 30 tablet 0 11/14/2024   metoprolol  succinate (TOPROL -XL) 50 MG 24 hr tablet TAKE 1 TABLET BY MOUTH ONCE DAILY . APPOINTMENT REQUIRED FOR FUTURE REFILLS 5 tablet 0 11/16/2024 Evening   nitroGLYCERIN  (NITROSTAT ) 0.4 MG SL tablet Place 1 tablet (0.4 mg total) under the tongue every 5 (five) minutes as needed for chest pain. 30 tablet 3 Unknown   spironolactone  (ALDACTONE ) 50 MG tablet Take 50 mg by mouth every morning.   11/16/2024 Evening   [DISCONTINUED] blood glucose meter kit and supplies Dispense based on patient and insurance preference. Use up to four times daily as directed. (FOR ICD-10 E10.9, E11.9). 1 each 0    [DISCONTINUED] Insulin  Pen Needle (PEN NEEDLES 3/16) 31G X 5 MM MISC 1 application by Does not apply route 2 (two) times daily. 30 each 3    Scheduled:   apixaban   5 mg Oral BID  atorvastatin   40 mg Oral Daily   folic acid   1 mg Oral Daily   influenza vac split trivalent PF  0.5 mL Intramuscular Tomorrow-1000   insulin  aspart  0-15 Units Subcutaneous TID WC   insulin  aspart  0-5 Units Subcutaneous QHS   insulin  aspart protamine- aspart  35 Units Subcutaneous BID WC   metoprolol  succinate  25 mg Oral Daily   multivitamin with minerals  1 tablet Oral Daily   nicotine   14 mg Transdermal Daily   sacubitril -valsartan   1 tablet Oral BID   sodium chloride  flush  3 mL Intravenous Q12H   spironolactone   50 mg Oral Daily   thiamine   100 mg Oral Daily   Or   thiamine   100 mg Intravenous Daily   Infusions:    PRN: acetaminophen , dextrose , ipratropium-albuterol , nitroGLYCERIN , sodium chloride  flush  ASSESSMENT: 58 y.o. male with PMH CHF, COPD, nonischemic cardiomyopathy and T2DM is presenting with chest pain. Patient is not on chronic anticoagulation  per chart review. Of note patient was previous on Xarelto  but has noted side effect of hematuria. No records of patient picking up Xarelto  in the past 6 months per dispense history. Pharmacy has been consulted to manage heparin  intravenous infusion.  Heparin  stopped post cath 12/1. Start apixaban  5mg  BID 12/2 -  ECHO EF 20% cannot rule out early forming mural apical LV thrombus.   Goal(s) of therapy: Monitor platelets by anticoagulation protocol: Yes  PLAN: Apixaban  5mg  BID  Monitor s/s bleeding    Olam Chalk Pharm.D. CPP, BCPS Clinical Pharmacist [phone] 11/19/2024 10:55 AM   Please check AMION for all [facility] Pharmacy phone numbers After 10:00 PM, call Main Pharmacy [phone]

## 2024-11-19 NOTE — Inpatient Diabetes Management (Signed)
 Inpatient Diabetes Program Recommendations  AACE/ADA: New Consensus Statement on Inpatient Glycemic Control  Target Ranges:  Prepandial:   less than 140 mg/dL      Peak postprandial:   less than 180 mg/dL (1-2 hours)      Critically ill patients:  140 - 180 mg/dL    Latest Reference Range & Units 11/18/24 06:18 11/18/24 12:52 11/18/24 15:56 11/18/24 20:56 11/19/24 06:31  Glucose-Capillary 70 - 99 mg/dL 747 (H) 672 (H) 720 (H) 235 (H) 236 (H)    Latest Reference Range & Units 11/16/24 17:52 11/18/24 02:33  Hemoglobin A1C 4.8 - 5.6 % 11.6 (H) 11.7 (H)   Review of Glycemic Control  Diabetes history: DM2 Outpatient Diabetes medications: 70/30 50 units daily (ran out 2 days prior to admission), Metformin  1000 mg BID Current orders for Inpatient glycemic control: 70/30 35 units BID, Novolog  0-15 units TID with meals, Novolog  0-5 units QHS   Inpatient Diabetes Program Recommendations:     Insulin : Please consider increasing 70/30 to 40 units BID today.   HbgA1C: A1C 11.6% on 11/16/24 indicating an average glucose of 286 mg/dl over the past 2-3 months.    Outpatient DM: AT time of discharge, please provide Rx for Novolin  70/30 generic insulin  pens (#[id number]), insulin  pen needles [phone]).   Thanks, Earnie Gainer, RN, MSN, CDCES Diabetes Coordinator Inpatient Diabetes Program [phone] (Team Pager from 8am to 5pm)

## 2024-11-19 NOTE — Progress Notes (Signed)
 Reviewed AVS, patient expressed understanding of medications, MD follow up reviewed.    Patient states all belongings brought to the hospital at time of admission are accounted for and packed to take home.   Picked up medications from [facility] pharmacy.  [facility] contacted to transport patient to entrance A, where family member was waiting in vehicle to transport home.

## 2024-11-19 NOTE — Progress Notes (Signed)
 Progress Note  Patient Name: Christopher Cross Date of Encounter: 11/19/2024  [facility] Cardiologist: Alvan Carrier, MD   Patient Profile     Subjective  Denies any chest pain or shortness of breath today.  Anxious to go home   Inpatient Medications    Scheduled Meds:  atorvastatin   40 mg Oral Daily   folic acid   1 mg Oral Daily   heparin   5,000 Units Subcutaneous Q8H   influenza vac split trivalent PF  0.5 mL Intramuscular Tomorrow-1000   insulin  aspart  0-15 Units Subcutaneous TID WC   insulin  aspart  0-5 Units Subcutaneous QHS   insulin  aspart protamine- aspart  35 Units Subcutaneous BID WC   LORazepam   0-4 mg Oral Q12H   metoprolol  succinate  25 mg Oral Daily   multivitamin with minerals  1 tablet Oral Daily   nicotine   14 mg Transdermal Daily   sacubitril-valsartan  1 tablet Oral BID   sodium chloride  flush  3 mL Intravenous Q12H   spironolactone   50 mg Oral Daily   thiamine   100 mg Oral Daily   Or   thiamine   100 mg Intravenous Daily   Continuous Infusions:   PRN Meds: acetaminophen , dextrose, ipratropium-albuterol , LORazepam  **OR** LORazepam , nitroGLYCERIN , sodium chloride  flush   Vital Signs    Vitals:   11/18/24 1936 11/18/24 2312 11/19/24 0435 11/19/24 0747  BP: 117/81 108/72 104/73 131/76  Pulse: 90 86 84 (!) 101  Resp: 18 15 14 14   Temp: 98.2 F (36.8 C) 98.1 F (36.7 C) 97.8 F (36.6 C) 97.8 F (36.6 C)  TempSrc: Oral Oral Oral Oral  SpO2: 96% 95% 96% 97%  Weight:      Height:        Intake/Output Summary (Last 24 hours) at 11/19/2024 9157 Last data filed at 11/19/2024 0400 Gross per 24 hour  Intake 740 ml  Output 2400 ml  Net -1660 ml      11/17/2024    9:33 PM 11/16/2024   11:42 PM 11/16/2024    5:18 PM  Last 3 Weights  Weight (lbs) 198 lb 3.1 oz 198 lb 10.2 oz 200 lb 1.6 oz  Weight (kg) 89.9 kg 90.1 kg 90.765 kg      Telemetry    Normal sinus rhythm- Personally Reviewed  ECG    No new EKG to review- Personally  Reviewed  Physical Exam   GEN: Well nourished, well developed in no acute distress HEENT: Normal NECK: No JVD; No carotid bruits LYMPHATICS: No lymphadenopathy CARDIAC:RRR, no murmurs, rubs, gallops RESPIRATORY:  Clear to auscultation without rales, wheezing or rhonchi  ABDOMEN: Soft, non-tender, non-distended MUSCULOSKELETAL:  No edema; No deformity  SKIN: Warm and dry NEUROLOGIC:  Alert and oriented x 3 PSYCHIATRIC:  Normal affect  Labs    High Sensitivity Troponin:   Recent Labs  Lab 11/17/24 0136 11/17/24 0502 11/17/24 0954 11/17/24 1410 11/17/24 1810  TROPONINIHS 394* 286* 239* 173* 162*      Chemistry Recent Labs  Lab 11/16/24 2205 11/18/24 0233 11/19/24 0242  NA 135 135 137  K 3.7 3.7 3.6  CL 100 104 104  CO2 23 25 24   GLUCOSE 197* 253* 212*  BUN 11 9 11   CREATININE 0.71 0.89 0.85  CALCIUM  8.9 8.4* 8.4*  GFRNONAA >60 >60 >60  ANIONGAP 12 6 9      Hematology Recent Labs  Lab 11/16/24 1752 11/18/24 0233 11/19/24 0242  WBC 13.8* 9.7 8.5  RBC 4.76 4.07* 4.13*  HGB 15.9  13.5 13.6  HCT 43.9 37.9* 38.6*  MCV 92.2 93.1 93.5  MCH 33.4 33.2 32.9  MCHC 36.2* 35.6 35.2  RDW 11.4* 11.6 11.6  PLT 199 147* 156    BNPNo results for input(s): BNP, PROBNP in the last 168 hours.   DDimer No results for input(s): DDIMER in the last 168 hours.    Radiology    CARDIAC CATHETERIZATION Result Date: 11/18/2024 Table formatting from the original result was not included. Images from the original result were not included. Dominance: Right  Angiographically minimal CAD. The LAD tapers to a relatively small caliber vessel by the mid vessel but does reach the apex. There is essentially equivalent of a high OM/RI and high 1st Diag branch and a nondominant LCx The RCA is large and dominant. Suspect that elevated troponin is more related to Exacerbation of Chronic Combined Systolic and Diastolic Heart Failure-and NOT non-STEMI/ACS Moderately elevated LVEDP of 19  mmHg. RECOMMENDATIONS   Anticipated discharge date to be determined.   Likely needs titration of GDMT for combined heart failure.   No indication for antiplatelet therapy at this time . Alm MICAEL Clay, MD, MS Alm Clay, M.D., M.S. Interventional Cardiologist [facility] Pager # [phone]   ECHOCARDIOGRAM COMPLETE Result Date: 11/17/2024    ECHOCARDIOGRAM REPORT   Patient Name:   Christopher Cross Date of Exam: 11/17/2024 Medical Rec #:  [id number]     Height:       69.0 in Accession #:    [id number]    Weight:       198.6 lb Date of Birth:  31-Dec-1965     BSA:          2.060 m Patient Age:    58 years      BP:           117/86 mmHg Patient Gender: M             HR:           100 bpm. Exam Location:  Inpatient Procedure: 2D Echo (Both Spectral and Color Flow Doppler were utilized during            procedure). Indications:    chest pain  History:        Patient has prior history of Echocardiogram examinations, most                 recent 11/11/2020. Cardiomyopathy, COPD; Risk Factors:Current                 Smoker, Hypertension and Diabetes.  Sonographer:    Tinnie Barefoot RDCS Referring Phys: [id number] HUSAM M [facility] IMPRESSIONS  1. Cannot rule out early forming mural apical LV thrombus. Left ventricular ejection fraction, by estimation, is 20 to 25%. The left ventricle has severely decreased function. The left ventricle demonstrates regional wall motion abnormalities (see scoring diagram/findings for description). Left ventricular diastolic function could not be evaluated. There is akinesis of the left ventricular, mid-apical septal wall, anteroseptal wall, inferoseptal wall, anterior wall, inferior wall and anterolateral  wall. There is akinesis of the left ventricular, apical inferolateral wall. There is akinesis of the left ventricular, apical segment. Hyperkinesis of the basal segments     m\esine  2. Right ventricular systolic function is normal. The right ventricular size is normal. Tricuspid  regurgitation signal is inadequate for assessing PA pressure.  3. The mitral valve is normal in structure. No evidence of mitral valve regurgitation. No evidence of mitral stenosis.  4. The aortic  valve is normal in structure. Aortic valve regurgitation is not visualized. No aortic stenosis is present.  5. There is mild (Grade II) plaque involving the descending aorta.  6. The inferior vena cava is normal in size with greater than 50% respiratory variability, suggesting right atrial pressure of 3 mmHg. FINDINGS  Left Ventricle: Cannot rule out early forming mural apical LV thrombus. Left ventricular ejection fraction, by estimation, is 20 to 25%. The left ventricle has severely decreased function. The left ventricle demonstrates regional wall motion abnormalities. Definity  contrast agent was given IV to delineate the left ventricular endocardial borders. The left ventricular internal cavity size was normal in size. There is no left ventricular hypertrophy. Left ventricular diastolic function could not be evaluated. Right Ventricle: The right ventricular size is normal. No increase in right ventricular wall thickness. Right ventricular systolic function is normal. Tricuspid regurgitation signal is inadequate for assessing PA pressure. Left Atrium: Left atrial size was normal in size. Right Atrium: Right atrial size was normal in size. Pericardium: There is no evidence of pericardial effusion. Mitral Valve: The mitral valve is normal in structure. No evidence of mitral valve regurgitation. No evidence of mitral valve stenosis. Tricuspid Valve: The tricuspid valve is normal in structure. Tricuspid valve regurgitation is not demonstrated. No evidence of tricuspid stenosis. Aortic Valve: The aortic valve is normal in structure. Aortic valve regurgitation is not visualized. No aortic stenosis is present. Pulmonic Valve: The pulmonic valve was normal in structure. Pulmonic valve regurgitation is not visualized. No evidence  of pulmonic stenosis. Aorta: The aortic root is normal in size and structure. There is mild (Grade II) plaque involving the descending aorta. Venous: The inferior vena cava is normal in size with greater than 50% respiratory variability, suggesting right atrial pressure of 3 mmHg. IAS/Shunts: No atrial level shunt detected by color flow Doppler.  LEFT VENTRICLE PLAX 2D LVIDd:         5.20 cm   Diastology LVIDs:         4.60 cm   LV e' medial:  5.22 cm/s LV PW:         1.00 cm   LV e' lateral: 5.77 cm/s LV IVS:        1.00 cm LVOT diam:     2.30 cm LV SV:         44 LV SV Index:   22 LVOT Area:     4.15 cm LV IVRT:       106 msec  RIGHT VENTRICLE             IVC RV Basal diam:  2.30 cm     IVC diam: 1.80 cm RV S prime:     12.20 cm/s TAPSE (M-mode): 1.6 cm LEFT ATRIUM             Index        RIGHT ATRIUM          Index LA diam:        2.90 cm 1.41 cm/m   RA Area:     8.64 cm LA Vol (A2C):   53.3 ml 25.87 ml/m  RA Volume:   20.00 ml 9.71 ml/m LA Vol (A4C):   31.2 ml 15.15 ml/m LA Biplane Vol: 41.9 ml 20.34 ml/m  AORTIC VALVE LVOT Vmax:   74.60 cm/s LVOT Vmean:  48.200 cm/s LVOT VTI:    0.107 m  AORTA Ao Root diam: 3.10 cm Ao Asc diam:  3.00 cm  SHUNTS Systemic VTI:  0.11 m Systemic Diam: 2.30 cm Wilbert Bihari MD Electronically signed by Wilbert Bihari MD Signature Date/Time: 11/17/2024/10:54:53 AM    Final     Patient Profile     58 y.o. male with a hx of T2DM, HTN, asthma/COPD, non-ischemic HFrEF (LVEF30-35%), history of LV thrombus 2021, tobacco use, and history of EtOH use who is being seen 11/17/2024 for the evaluation of chest pain.  High-sensitivity troponin peaked at 394 and has trended down to 286   Assessment & Plan    NSTEMI Hyperlipidemia Presents with an episode of sharp, retrosternal chest pain with radiation to the bilateral upper extremities and shortness of breath.  Symptoms lasted for about 15 minutes and resolved with SL NG. Mildly elevated but relatively flat troponin trend (troponin  111 =>131 =>122=> 394=> 286).  EKG with no acute ischemic changes.  His symptoms are concerning for a cardiac event. He is now being treated for mild DKA which possibly could have resulted in elevated troponin trend from demand ischemia but given his overall symptoms and resolution with nitroglycerin  need to assume this is type I NSTEMI until further evaluation.   -2D echo 11/17/2024: EF 20 to 25% with mid to apical akinesis of the septal wall, anterior septal wall, inferoseptal wall, anterior wall, anterior lateral wall and apical inferior lateral wall segments with hyperkinesis of the basal segments>> this could represent a stress cardiomyopathy  -Lipids this admission showed an LDL 59, HDL 55 and triglycerides 161  -HbA1c pending -Cath this morning showed normal coronary arteries with LVEDP 19 mmHg -Toprol  XL 25 mg daily  -Stop ASA since we are starting on DOAC for possible early forming LV thrombus -Transition IV heparin  to Eliquis  5 mg twice daily starting tomorrow morning  -Continue atorvastatin  40 mg daily for diabetes and hyperlipidemia   HFrEF NICM possibly stress cardiomyopathy Last 2D echo 11/11/2020 showed EF 30 to 35% with possible stress cardiomyopathy given mid inferoseptal, mid anterolateral and apical akinesis as well as mid anterior/inferior hypokinesis with normal basal segments euvolemic with no signs of HF. - 2D echo this admission showed severe LV dysfunction EF 20 to 25% with multiple focal wall motion abnormalities in the mid to apical portions of the LV likely represents stress cardiomyopathy given normal coronary arteries on This admission - PTA he was on GDMT with losartan  and Lopressor  --he states that he had been compliant with his medication but it appears that both of these meds expired in 2024 with no subsequent refills. -Serum creatinine 0.85 today after cath and K+ 3.6 -BP stable at 131/76 mmHg -He put out 2.4 L yesterday and is net -2.6 L since admission -Appears  euvolemic on exam today -Continue Entresto 24-26 mg twice daily Toprol  XL 25 mg daily and spironolactone  50 mg daily -No SGLT2i given diabetes requiring insulin  -Repeat 2D echo in 2 months after patient on maximum tolerated GDMT   History of LV thrombus - Previously on Xarelto  but stopped it due to hematuria; unclear when but patient believes it was stopped ~2 years ago. No recent bleeding issues - 2D echo concerning for possible early forming LV thrombus and clearly with akinesis of the entire apex is at higher risk.  Since this may be a stress cardiomyopathy will treat with Eliquis  in the short-term and repeat 2D echo in 2 months and if LV function is normalized then can stop the DOAC - Start Eliquis  5 mg twice daily today -If he should develop recurrent hematuria he would need urologic evaluation (hemoglobin stable  at 13.6)   Hypertension - BP well-controlled at 131/76 mmHg today - He had been on losartan  and Lopressor  at home but these have not been refilled since 2024 even though he says he has been taking them - Continue spironolactone  50 mg daily, Toprol  XL 25 mg daily and transition ARB to Entresto 24-26 mg twice daily  Patient is stable from a cardiac standpoint for discharged home today.  [facility] will sign off.   Medication Recommendations: Atorvastatin  40 mg daily, Toprol  XL 25 mg daily, Entresto 24-26 mg twice daily and spironolactone  50 mg daily, Eliquis  5 mg twice daily Other recommendations (labs, testing, etc): Bmet in 1 week Follow up as an outpatient: 2D echo in 2 months; Follow-up in [facility] on 12/5  I spent 30 minutes caring for this patient today face to face, ordering and reviewing labs, reviewing records from 2D echo and cardiac cath this admission (facility and dates), seeing the patient, documenting in the record, and arranging for a follow-up labs outpatient and 2D echo in 2 months     For questions or updates, please contact [facility] Please consult [email or web address] for contact info under        Signed, Wilbert Bihari, MD  11/19/2024, 8:42 AM

## 2024-11-19 NOTE — Progress Notes (Signed)
 Outpatient BMET in 1 week per Dr Shlomo.

## 2024-11-20 LAB — LIPOPROTEIN A (LPA): Lipoprotein (a): 72.9 nmol/L — ABNORMAL HIGH (ref <75.0)

## 2024-11-21 ENCOUNTER — Telehealth (HOSPITAL_COMMUNITY): Payer: Self-pay

## 2024-11-21 NOTE — Telephone Encounter (Signed)
 Called to confirm/remind patient of their appointment at the [facility] on 11/22/24 10:30.   Appointment:   [x] Confirmed  [] Left mess   [] No answer/No voice mail  [] VM Full/unable to leave message  [] Phone not in service  Patient reminded to bring all medications and/or complete list.  Confirmed patient has transportation. Gave directions, instructed to utilize valet parking.

## 2024-11-22 ENCOUNTER — Encounter (HOSPITAL_COMMUNITY): Payer: Self-pay

## 2024-11-22 ENCOUNTER — Other Ambulatory Visit (HOSPITAL_COMMUNITY): Payer: Self-pay

## 2024-11-22 ENCOUNTER — Other Ambulatory Visit (HOSPITAL_COMMUNITY): Payer: Self-pay | Admitting: *Deleted

## 2024-11-22 ENCOUNTER — Inpatient Hospital Stay (HOSPITAL_COMMUNITY): Admit: 2024-11-22 | Discharge: 2024-11-22 | Payer: Self-pay | Attending: Physician Assistant

## 2024-11-22 ENCOUNTER — Ambulatory Visit (HOSPITAL_COMMUNITY): Payer: Self-pay | Admitting: Physician Assistant

## 2024-11-22 VITALS — BP 112/80 | HR 95 | Ht 69.0 in | Wt 201.6 lb

## 2024-11-22 DIAGNOSIS — I513 Intracardiac thrombosis, not elsewhere classified: Secondary | ICD-10-CM

## 2024-11-22 DIAGNOSIS — I5022 Chronic systolic (congestive) heart failure: Secondary | ICD-10-CM

## 2024-11-22 DIAGNOSIS — I428 Other cardiomyopathies: Secondary | ICD-10-CM

## 2024-11-22 DIAGNOSIS — F1010 Alcohol abuse, uncomplicated: Secondary | ICD-10-CM

## 2024-11-22 DIAGNOSIS — Z720 Tobacco use: Secondary | ICD-10-CM

## 2024-11-22 LAB — BASIC METABOLIC PANEL WITH GFR
Anion gap: 9 (ref 5–15)
BUN: 9 mg/dL (ref 6–20)
CO2: 26 mmol/L (ref 22–32)
Calcium: 9.1 mg/dL (ref 8.9–10.3)
Chloride: 103 mmol/L (ref 98–111)
Creatinine, Ser: 0.87 mg/dL (ref 0.61–1.24)
GFR, Estimated: >60 mL/min (ref >60)
Glucose, Bld: 76 mg/dL (ref 70–99)
Potassium: 4.5 mmol/L (ref 3.5–5.1)
Sodium: 138 mmol/L (ref 135–145)

## 2024-11-22 LAB — BRAIN NATRIURETIC PEPTIDE: B Natriuretic Peptide: 146.5 pg/mL — ABNORMAL HIGH (ref 0.0–100.0)

## 2024-11-22 MED ORDER — SACUBITRIL-VALSARTAN 24-26 MG PO TABS: 1.0000 | ORAL_TABLET | Freq: Two times a day (BID) | ORAL | 3 refills | Status: DC

## 2024-11-22 MED ORDER — ATORVASTATIN CALCIUM 40 MG PO TABS
40.0000 mg | ORAL_TABLET | Freq: Every day | ORAL | 3 refills | Status: AC
  Filled 2024-11-22: qty 90, 90d supply, fill #0

## 2024-11-22 MED ORDER — METOPROLOL SUCCINATE ER 25 MG PO TB24
50.0000 mg | ORAL_TABLET | Freq: Every day | ORAL | 3 refills | Status: DC
  Filled 2024-11-22: qty 180, 90d supply, fill #0

## 2024-11-22 MED ORDER — APIXABAN 5 MG PO TABS: 5.0000 mg | ORAL_TABLET | Freq: Two times a day (BID) | ORAL | 3 refills | Status: AC

## 2024-11-22 MED ORDER — SPIRONOLACTONE 50 MG PO TABS
50.0000 mg | ORAL_TABLET | Freq: Every day | ORAL | 3 refills | Status: AC
  Filled 2024-11-22: qty 90, 90d supply, fill #0

## 2024-11-22 NOTE — Patient Instructions (Addendum)
 Increase Metoprolol  to 50 mg daily - updated Rx sent. Sent Rx for metoprolol , spiro, and atorvastatin  to [facility] requesting they be paid for by [facility] and that they be delivered to you. The pharmacy will call you to confirm - see below. Rx for Entresto  sent to [facility] - see below. Rx for Eliquis  sent to your local pharmacy. Our Pharmacist is applying for assistance for you. Provided you with 1 month samples of same. Labs today - will call you if abnormal. Return to [facility] Clinic in 2 weeks - see below. Call us  at [phone] if any questions or concerns prior to your next appointment.

## 2024-11-22 NOTE — Progress Notes (Signed)
 Provided patient with samples:  Eliquis  5 mg LN [id number] Exp: 3/26 # 56

## 2024-11-22 NOTE — Progress Notes (Addendum)
 HEART & VASCULAR TRANSITION OF CARE CONSULT NOTE     Referring Physician: Dr. Shlomo Outhouse, Benita Area, MD   Chief Complaint: HFrEF  HPI: Referred to clinic by Dr. Shlomo with [facility] for heart failure consultation.   Christopher Cross is a 58 y.o. male with history of DM II, HTN, COPD/asthma, hx tobacco use, ETOH abuse, HFrEF (diagnosed 2021), NICM, hx LV thrombus (previously stopped Xarelto  d/t hematuria).   Previously followed by Dr. Alvan with Cardiology but has not been seen since 2022  Admitted 11/25 with DKA after running out of insulin  and chest pain w/ mildly elevated troponin. HS troponin with flat trend. Treated as NSTEMI d/t concerning symptoms. Echo w/ LVEF 20-25%, cannot r/o early apical LV thrombus, R WMA, RV okay, grade II plaque in descending aorta. LHC with minimal CAD and LVEDP of 19. He was diuresed and GDMT titrated.  Here today for post hospital CHF follow-up. He is feeling much better than he did a couple of weeks ago. No chest pain, shortness of breath, orthopnea, PND or lower extremity edema. Home weight has been stable around 199 lb the last 3 days. He works full time operating a truck. His employer does not offer health insurance and he makes too much for medicaid. He is worried about paying for his medications.   Has consumed at least 4 shots of brandy a day for years, has cut back still has 1-2 drinks nightly. Cut back tobacco from 2 ppd to 1 ppd. No drug use.   Past Medical History:  Diagnosis Date   Asthma    Diabetes mellitus without complication (HCC)    Other emphysema (HCC)    Unspecified essential hypertension     Current Outpatient Medications  Medication Sig Dispense Refill   apixaban  (ELIQUIS ) 5 MG TABS tablet Take 1 tablet (5 mg total) by mouth 2 (two) times daily. 60 tablet 0   atorvastatin  (LIPITOR) 40 MG tablet Take 1 tablet (40 mg total) by mouth daily. 30 tablet 0   blood glucose meter kit and supplies Dispense based  on patient and insurance preference. Use up to four times daily as directed. (FOR ICD-10 E10.9, E11.9). 1 each 0   Blood Glucose Monitoring Suppl DEVI 1 each by Does not apply route in the morning, at noon, and at bedtime. May substitute to any manufacturer covered by patient's insurance. 1 each 0   EPINEPHrine (PRIMATENE MIST IN) Inhale 2 puffs into the lungs daily as needed (shortness of breath).     Glucose Blood (BLOOD GLUCOSE TEST STRIPS) STRP Use in the morning, at noon, and at bedtime. May substitute to any manufacturer covered by patient's insurance. 100 strip 0   insulin  aspart protamine - aspart (NOVOLOG  70/30 MIX) (70-30) 100 UNIT/ML FlexPen Inject 35 Units into the skin 2 (two) times daily with a meal. 30 mL 0   Insulin  Pen Needle (PEN NEEDLES 3/16) 31G X 5 MM MISC 1 application  by Does not apply route 2 (two) times daily. 30 each 3   Insulin  Pen Needle 32G X 4 MM MISC Use as directed with insulin  100 each 0   Lancets Misc. (ACCU-CHEK SOFTCLIX LANCET DEV) KIT 1 each by Does not apply route in the morning, at noon, and at bedtime. May substitute to any manufacturer covered by patient's insurance. 1 kit 0   Lancets MISC Use up to four times daily as directed. (FOR ICD-10 E10.9, E11.9). 100 each 0   metFORMIN  (GLUCOPHAGE ) 1000 MG  tablet Take 1 tablet (1,000 mg total) 2 (two) times daily by mouth. 30 tablet 0   metoprolol  succinate (TOPROL -XL) 25 MG 24 hr tablet Take 1 tablet (25 mg total) by mouth daily. 30 tablet 0   Multiple Vitamin (MULTIVITAMIN WITH MINERALS) TABS tablet Take 1 tablet by mouth daily. 30 tablet 11   Multiple Vitamins-Minerals (CERTAVITE SENIOR/ANTIOXIDANT) TABS Take 1 tablet by mouth daily.     nitroGLYCERIN  (NITROSTAT ) 0.4 MG SL tablet Place 1 tablet (0.4 mg total) under the tongue every 5 (five) minutes as needed for chest pain. 25 tablet 0   sacubitril -valsartan  (ENTRESTO ) 24-26 MG Take 1 tablet by mouth 2 (two) times daily. 60 tablet 0   spironolactone  (ALDACTONE ) 50  MG tablet Take 1 tablet (50 mg total) by mouth daily. 30 tablet 0   No current facility-administered medications for this encounter.    Allergies  Allergen Reactions   Xarelto  [Rivaroxaban ] Other (See Comments)    hematuria      Social History   Socioeconomic History   Marital status: Significant Other    Spouse name: Not on file   Number of children: 2   Years of education: Not on file   Highest education level: GED or equivalent  Occupational History   Occupation: [employer]  Tobacco Use   Smoking status: Every Day    Current packs/day: 1.50    Average packs/day: 2.0 packs/day for 8.9 years (17.8 ttl pk-yrs)    Types: Cigarettes    Start date: 12/21/2015   Smokeless tobacco: Never   Tobacco comments:    started back after about 15 days   Vaping Use   Vaping status: Never Used  Substance and Sexual Activity   Alcohol use: Yes    Alcohol/week: 2.0 standard drinks of alcohol    Types: 2 Shots of liquor per week   Drug use: No   Sexual activity: Not on file  Other Topics Concern   Not on file  Social History Narrative   Not on file   Social Drivers of Health   Financial Resource Strain: High Risk (11/18/2024)   Overall Financial Resource Strain (CARDIA)    Difficulty of Paying Living Expenses: Hard  Food Insecurity: No Food Insecurity (11/17/2024)   Hunger Vital Sign    Worried About Running Out of Food in the Last Year: Never true    Ran Out of Food in the Last Year: Never true  Transportation Needs: No Transportation Needs (11/17/2024)   PRAPARE - Administrator, Civil Service (Medical): No    Lack of Transportation (Non-Medical): No  Physical Activity: Not on file  Stress: Not on file  Social Connections: Not on file  Intimate Partner Violence: Unknown (11/17/2024)   Humiliation, Afraid, Rape, and Kick questionnaire    Fear of Current or Ex-Partner: No    Emotionally Abused: No    Physically Abused: Not on file    Sexually Abused: Not on  file      Family History  Problem Relation Age of Onset   Heart attack Other        Grandfather   Heart attack Other        Uncle    Vitals:   11/22/24 1002  BP: 112/80  Pulse: 95  SpO2: 96%  Weight: 91.4 kg (201 lb 9.6 oz)  Height: 5' 9 (1.753 m)    PHYSICAL EXAM: General:  Well appearing.  Cor: No JVD. Regular rate & rhythm. No murmurs. Lungs: clear Abdomen: soft,  nontender, nondistended.  Extremities: no edema Neuro: alert & oriented x 3. Affect pleasant.   ECG 11/16/24: Sinus tach    ASSESSMENT & PLAN: HFrEF/NICM -Dates back to 2021. EF 30-35% at that time. -Echo 11/25: LVEF 20-25%, cannot r/o early apical LV thrombus, R WMA, RV okay, grade II plaque in descending aorta -LHC 11/25 with no significant CAD -Etiology not certain. Possibly 2/2 ETOH abuse +/- HTN (although BP does not appear markedly elevated). -NYHA II. Volume looks good on exam. Does not need diuretic. -Increase toprol  xl to 50 mg daily -Continue entresto  24/26 mg BID -continue spiro 50 mg daily for now. Consider cutting back next visit to allow room to titrate GDMT. -Need to make med changes slowly and try to keep instructions simple. Sent entresto  to the [facility] for discounted refills, send remaining HF meds to [facility] via HF fund for delivery -No SGLT2i with hx DKA -Labs today  Hx LV thrombus -Continue eliquis  5 mg BID, HF pharmacy team engaged to help enroll in patient assistance  Uncontrolled DM II -Recent DKA after running out of insulin  -A1c 11.6 in 11/25 -He is planning to follow-up with [facility] for refills  ETOH abuse -Has cut back, discussed cessation  Tobacco abuse -Has cut back from 2 ppd to 1 ppd -Using flavored toothpicks to help with cravings. Discussed cessation in detail. -Has used aids in the past. Will let us  know if he wants to try them again.  Referred to HFSW (PCP, Medications, Transportation, ETOH Abuse, Drug Abuse, Insurance, Financial  ): No Refer to Pharmacy: Yes, see above Refer to Home Health: No Refer to Advanced Heart Failure Clinic: Yes Refer to General Cardiology: No  Follow up  2 weeks with APP for medication titration, then will need to establish with HF Cardiologist

## 2024-12-02 ENCOUNTER — Telehealth (HOSPITAL_COMMUNITY): Payer: Self-pay | Admitting: Cardiology

## 2024-12-02 NOTE — Telephone Encounter (Signed)
 Triage walk in  Patient walked in to request return to work note.  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

## 2024-12-03 NOTE — Telephone Encounter (Addendum)
 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 Need to confirm this and what his job duties are.

## 2024-12-03 NOTE — Addendum Note (Signed)
 Encounter addended by: Colletta Manuelita Garre, PA-C on: 12/03/2024 7:27 AM  Actions taken: Clinical Note Signed

## 2024-12-03 NOTE — Telephone Encounter (Signed)
 Discussed with clinic MD. 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 No repetitive lifting > 40 lb.

## 2024-12-03 NOTE — Telephone Encounter (Signed)
 Pt aware.

## 2024-12-03 NOTE — Telephone Encounter (Signed)
 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

## 2024-12-13 ENCOUNTER — Encounter (HOSPITAL_COMMUNITY): Payer: Self-pay

## 2024-12-13 ENCOUNTER — Ambulatory Visit (HOSPITAL_COMMUNITY)
Admission: RE | Admit: 2024-12-13 | Discharge: 2024-12-13 | Disposition: A | Discharge status: Home / Self Care | Payer: Self-pay | Source: Ambulatory Visit | Attending: Adult Health | Admitting: Adult Health

## 2024-12-13 VITALS — BP 140/80 | HR 82 | Ht 69.0 in | Wt 202.6 lb

## 2024-12-13 DIAGNOSIS — I5022 Chronic systolic (congestive) heart failure: Secondary | ICD-10-CM | POA: Insufficient documentation

## 2024-12-13 DIAGNOSIS — F1010 Alcohol abuse, uncomplicated: Secondary | ICD-10-CM | POA: Insufficient documentation

## 2024-12-13 DIAGNOSIS — Z720 Tobacco use: Secondary | ICD-10-CM

## 2024-12-13 DIAGNOSIS — Z7984 Long term (current) use of oral hypoglycemic drugs: Secondary | ICD-10-CM | POA: Insufficient documentation

## 2024-12-13 DIAGNOSIS — Z5971 Insufficient health insurance coverage: Secondary | ICD-10-CM | POA: Insufficient documentation

## 2024-12-13 DIAGNOSIS — I110 Hypertensive heart disease with heart failure: Secondary | ICD-10-CM | POA: Insufficient documentation

## 2024-12-13 DIAGNOSIS — I513 Intracardiac thrombosis, not elsewhere classified: Secondary | ICD-10-CM

## 2024-12-13 DIAGNOSIS — Z794 Long term (current) use of insulin: Secondary | ICD-10-CM | POA: Insufficient documentation

## 2024-12-13 DIAGNOSIS — Z7901 Long term (current) use of anticoagulants: Secondary | ICD-10-CM | POA: Insufficient documentation

## 2024-12-13 DIAGNOSIS — E1165 Type 2 diabetes mellitus with hyperglycemia: Secondary | ICD-10-CM | POA: Insufficient documentation

## 2024-12-13 DIAGNOSIS — F17210 Nicotine dependence, cigarettes, uncomplicated: Secondary | ICD-10-CM | POA: Insufficient documentation

## 2024-12-13 DIAGNOSIS — I428 Other cardiomyopathies: Secondary | ICD-10-CM | POA: Insufficient documentation

## 2024-12-13 DIAGNOSIS — J4489 Other specified chronic obstructive pulmonary disease: Secondary | ICD-10-CM | POA: Insufficient documentation

## 2024-12-13 MED ORDER — SACUBITRIL-VALSARTAN 49-51 MG PO TABS: 1.0000 | ORAL_TABLET | Freq: Two times a day (BID) | ORAL | 3 refills | Status: AC

## 2024-12-13 NOTE — Patient Instructions (Signed)
 Medication Changes:  INCREASE Entresto  to 49/51 mg Twice daily    Special Instructions // Education:  Do the following things EVERYDAY: Weigh yourself in the morning before breakfast. Write it down and keep it in a log. Take your medicines as prescribed Eat low salt foods--Limit salt (sodium) to 2000 mg per day.  Stay as active as you can everyday Limit all fluids for the day to less than 2 liters   Follow-Up in:   2 weeks 6 weeks   At the [facility], you and your health needs are our priority. We have a designated team specialized in the treatment of Heart Failure. This Care Team includes your primary Heart Failure Specialized Cardiologist (physician), Advanced Practice Providers (APPs- Physician Assistants and Nurse Practitioners), and Pharmacist who all work together to provide you with the care you need, when you need it.   You may see any of the following providers on your designated Care Team at your next follow up:  Dr. Toribio Fuel Dr. Ezra Shuck Dr. Odis Brownie Greig Mosses, NP Caffie Shed, [place] [facility] [place], [place] Beckey Coe, NP Jordan Lee, NP Tinnie Redman, PharmD   Please be sure to bring in all your medications bottles to every appointment.   Need to Contact Us :  If you have any questions or concerns before your next appointment please send us  a message through [place] or call our office at [phone].    TO LEAVE A MESSAGE FOR THE NURSE SELECT OPTION 2, PLEASE LEAVE A MESSAGE INCLUDING: YOUR NAME DATE OF BIRTH CALL BACK NUMBER REASON FOR CALL**this is important as we prioritize the call backs  YOU WILL RECEIVE A CALL BACK THE SAME DAY AS LONG AS YOU CALL BEFORE 4:00 PM

## 2024-12-13 NOTE — Progress Notes (Signed)
 "  Advanced Heart Failure Consult  PCP:  [facility] Primary Cardiologist: Dr Alvan   Chief Complaint: Heart Failure   HPI: Mr Christopher Cross was referred to Advanced [facility] Clinic by Manuelita Dutch for Heart Failure Consulation.   Christopher Cross is a 58 y.o. male with history of DM II, HTN, COPD/asthma, hx tobacco use, ETOH abuse, HFrEF (diagnosed 2021), NICM, hx LV thrombus (previously stopped Xarelto  d/t hematuria).   Echo 2021- LVEF 30-35%    Previously followed by Dr. Alvan with Cardiology but has not been seen since 2022   Admitted 11/25 with DKA after running out of insulin  and chest pain w/ mildly elevated troponin. HS troponin with flat trend. Treated as NSTEMI d/t concerning symptoms. Echo w/ LVEF 20-25%, cannot r/o early apical LV thrombus, R WMA, RV okay, grade II plaque in descending aorta. LHC with minimal CAD and LVEDP of 19. He was diuresed and GDMT titrated.  He was seen in the [facility] clinic 11/22/24. Toprol  XL increased to 50 mg daily.   Overall feeling fine. Denies SOB/PND/Orthopnea. Able to walk up steps. Appetite ok. No fever or chills.  Taking all medications.Smoking 1 pack of cigarettes every 2-3 days. He is trying to cut back [place]. He drank 1/5 over the last 3 days because it was the holiday. He lives with his Mom/Dad/Sister. He works full time running heavy equipment.   ROS: All systems negative except as listed in HPI, PMH and Problem List.  SH:  Social History   Socioeconomic History   Marital status: Significant Other    Spouse name: Not on file   Number of children: 2   Years of education: Not on file   Highest education level: GED or equivalent  Occupational History   Occupation: [employer]  Tobacco Use   Smoking status: Every Day    Current packs/day: 1.50    Average packs/day: 2.0 packs/day for 9.0 years (17.9 ttl pk-yrs)    Types: Cigarettes    Start date: 12/21/2015   Smokeless tobacco: Never   Tobacco comments:    started back after  about 15 days   Vaping Use   Vaping status: Never Used  Substance and Sexual Activity   Alcohol use: Yes    Alcohol/week: 2.0 standard drinks of alcohol    Types: 2 Shots of liquor per week   Drug use: No   Sexual activity: Not on file  Other Topics Concern   Not on file  Social History Narrative   Not on file   Social Drivers of Health   Tobacco Use: High Risk (12/13/2024)   Patient History    Smoking Tobacco Use: Every Day    Smokeless Tobacco Use: Never    Passive Exposure: Not on file  Financial Resource Strain: High Risk (11/18/2024)   Overall Financial Resource Strain (CARDIA)    Difficulty of Paying Living Expenses: Hard  Food Insecurity: No Food Insecurity (11/17/2024)   Epic    Worried About Programme Researcher, Broadcasting/film/video in the Last Year: Never true    Ran Out of Food in the Last Year: Never true  Transportation Needs: No Transportation Needs (11/17/2024)   Epic    Lack of Transportation (Medical): No    Lack of Transportation (Non-Medical): No  Physical Activity: Not on file  Stress: Not on file  Social Connections: Not on file  Intimate Partner Violence: Unknown (11/17/2024)   Epic    Fear of Current or Ex-Partner: No    Emotionally Abused: No  Physically Abused: Not on file    Sexually Abused: Not on file  Depression (EYV7-0): Not on file  Alcohol Screen: Low Risk (11/18/2024)   Alcohol Screen    Last Alcohol Screening Score (AUDIT): 4  Housing: Low Risk (11/17/2024)   Epic    Unable to Pay for Housing in the Last Year: No    Number of Times Moved in the Last Year: 0    Homeless in the Last Year: No  Utilities: Not At Risk (11/17/2024)   Epic    Threatened with loss of utilities: No  Health Literacy: Not on file    FH:  Family History  Problem Relation Age of Onset   Heart attack Other        Grandfather   Heart attack Other        Uncle    Past Medical History:  Diagnosis Date   Asthma    Diabetes mellitus without complication (HCC)    Other  emphysema (HCC)    Unspecified essential hypertension     Current Outpatient Medications  Medication Sig Dispense Refill   apixaban  (ELIQUIS ) 5 MG TABS tablet Take 1 tablet (5 mg total) by mouth 2 (two) times daily. 180 tablet 3   atorvastatin  (LIPITOR) 40 MG tablet Take 1 tablet (40 mg total) by mouth daily. 90 tablet 3   blood glucose meter kit and supplies Dispense based on patient and insurance preference. Use up to four times daily as directed. (FOR ICD-10 E10.9, E11.9). 1 each 0   Blood Glucose Monitoring Suppl DEVI 1 each by Does not apply route in the morning, at noon, and at bedtime. May substitute to any manufacturer covered by patient's insurance. 1 each 0   EPINEPHrine (PRIMATENE MIST IN) Inhale 2 puffs into the lungs daily as needed (shortness of breath).     Glucose Blood (BLOOD GLUCOSE TEST STRIPS) STRP Use in the morning, at noon, and at bedtime. May substitute to any manufacturer covered by patient's insurance. 100 strip 0   insulin  aspart protamine - aspart (NOVOLOG  70/30 MIX) (70-30) 100 UNIT/ML FlexPen Inject 35 Units into the skin 2 (two) times daily with a meal. 30 mL 0   Insulin  Pen Needle (PEN NEEDLES 3/16) 31G X 5 MM MISC 1 application  by Does not apply route 2 (two) times daily. 30 each 3   Insulin  Pen Needle 32G X 4 MM MISC Use as directed with insulin  100 each 0   Lancets Misc. (ACCU-CHEK SOFTCLIX LANCET DEV) KIT 1 each by Does not apply route in the morning, at noon, and at bedtime. May substitute to any manufacturer covered by patient's insurance. 1 kit 0   Lancets MISC Use up to four times daily as directed. (FOR ICD-10 E10.9, E11.9). 100 each 0   metFORMIN  (GLUCOPHAGE ) 1000 MG tablet Take 1 tablet (1,000 mg total) 2 (two) times daily by mouth. 30 tablet 0   metoprolol  succinate (TOPROL -XL) 25 MG 24 hr tablet Take 2 tablets (50 mg total) by mouth daily. 180 tablet 3   Multiple Vitamin (MULTIVITAMIN WITH MINERALS) TABS tablet Take 1 tablet by mouth daily. 30 tablet  11   Multiple Vitamins-Minerals (CERTAVITE SENIOR/ANTIOXIDANT) TABS Take 1 tablet by mouth daily.     nitroGLYCERIN  (NITROSTAT ) 0.4 MG SL tablet Place 1 tablet (0.4 mg total) under the tongue every 5 (five) minutes as needed for chest pain. 25 tablet 0   sacubitril -valsartan  (ENTRESTO ) 24-26 MG Take 1 tablet by mouth 2 (two) times daily. 180 tablet 3  spironolactone  (ALDACTONE ) 50 MG tablet Take 1 tablet (50 mg total) by mouth daily. 90 tablet 3   No current facility-administered medications for this encounter.    Vitals:   12/13/24 0946  BP: (!) 140/80  Pulse: 82  SpO2: 98%  Weight: 91.9 kg (202 lb 9.6 oz)  Height: 5' 9 (1.753 m)   Wt Readings from Last 3 Encounters:  12/13/24 91.9 kg (202 lb 9.6 oz)  11/22/24 91.4 kg (201 lb 9.6 oz)  11/17/24 89.9 kg (198 lb 3.1 oz)    PHYSICAL EXAM: General:   No resp difficulty Neck: no JVD.  Cor: Regular rate & rhythm.  Lungs: clear Abdomen: soft, nontender, nondistended.  Extremities: no  edema Neuro: alert & oriented x3   ASSESSMENT & PLAN: HFrEF/NICM -Dates back to 2021. EF 30-35% at that time. -Echo 11/25: LVEF 20-25%, cannot r/o early apical LV thrombus, R WMA, RV okay, grade II plaque in descending aorta -LHC 11/25 with no significant CAD -Etiology not certain. Possibly 2/2 ETOH abuse +/- HTN (although BP does not appear markedly elevated). -NYHA II. Appears euvolemic. Does not need diuretics.  -Continue  toprol  xl to 50 mg daily -Increase entresto  49-51 mg twice a day. Check BMET next visit. He is getting entresto  from [facility] -continue spiro 50 mg daily for now.  -No SGLT2i with hx DKA, Hgb A1C 11.6  -Plan to repeat ECHO after HF meds optimized in 2-3 months.  - We discussed importance of alcohol cessation.    Hx LV thrombus - no bleeding issues.  -Continue eliquis  5 mg BID   Uncontrolled DM II -Recent DKA after running out of insulin  -A1c 11.6 in 11/25 -He has had follow up at [facility].      ETOH abuse -He continues to drink but is trying to cut back.  -Discussed cessation.  Offered to give information for AA but he declined.    Tobacco abuse   Smoking 2 packs per week. Discussed cesation.   SDOH He is uninsured. Able to get meds from [facility].   Follow up in 2 weeks with APP then 6 weeks with Dr Zenaida.     Yanisa Goodgame NP-C  10:03 AM  "

## 2024-12-16 ENCOUNTER — Telehealth (HOSPITAL_COMMUNITY): Payer: Self-pay

## 2024-12-16 NOTE — Telephone Encounter (Signed)
 Advanced Heart Failure Patient Advocate Encounter  Application for Eliquis  faxed to [employer] on 12/16/2024. Application form attached to patient chart.  Rachel DEL, CPhT Rx Patient Advocate Phone: [phone]

## 2024-12-24 NOTE — Progress Notes (Signed)
 "  ADVANCED HF CLINIC NOTE   PCP:  [facility] Primary Cardiologist: Dr Alvan   Chief Complaint: Heart Failure   HPI: Christopher Cross is 59 y.o. male with history of DM II, HTN, COPD/asthma, hx tobacco use, ETOH abuse, HFrEF (diagnosed 2021), NICM, hx LV thrombus (previously stopped Xarelto  d/t hematuria).   Echo 2021- LVEF 30-35%    Previously followed by Dr. Alvan with Cardiology but has not been seen since 2022   Admitted 11/25 with DKA after running out of insulin  and chest pain w/ mildly elevated troponin. HS troponin with flat trend. Treated as NSTEMI d/t concerning symptoms. Echo w/ LVEF 20-25%, cannot r/o early apical LV thrombus, R WMA, RV okay, grade II plaque in descending aorta. LHC with minimal CAD and LVEDP of 19. He was diuresed and GDMT titrated.  He was seen in the [facility] clinic 11/22/24. Toprol  XL increased to 50 mg daily.   Today he returns for AHF follow up. Overall feeling good. Denies palpitations, CP, dizziness, edema, or PND/Orthopnea. No SOB. Appetite ok, does not watch what he eats, has been eating sausage biscuits frequently. No fever or chills. Has not been weighting regularly at home. Taking all medications. He lives with his Mom/Dad/Sister. Walks his dog to stay active. Smokes 1 pack every 2 days. Drinks occasionally now, last drank during the holidays.   ROS: All systems negative except as listed in HPI, PMH and Problem List.  SH:  Social History   Socioeconomic History   Marital status: Significant Other    Spouse name: Not on file   Number of children: 2   Years of education: Not on file   Highest education level: GED or equivalent  Occupational History   Occupation: [employer]  Tobacco Use   Smoking status: Every Day    Current packs/day: 1.50    Average packs/day: 2.0 packs/day for 9.0 years (18.0 ttl pk-yrs)    Types: Cigarettes    Start date: 12/21/2015   Smokeless tobacco: Never   Tobacco comments:    started back after about 15 days    Vaping Use   Vaping status: Never Used  Substance and Sexual Activity   Alcohol use: Yes    Alcohol/week: 2.0 standard drinks of alcohol    Types: 2 Shots of liquor per week   Drug use: No   Sexual activity: Not on file  Other Topics Concern   Not on file  Social History Narrative   Not on file   Social Drivers of Health   Tobacco Use: High Risk (12/27/2024)   Patient History    Smoking Tobacco Use: Every Day    Smokeless Tobacco Use: Never    Passive Exposure: Not on file  Financial Resource Strain: High Risk (11/18/2024)   Overall Financial Resource Strain (CARDIA)    Difficulty of Paying Living Expenses: Hard  Food Insecurity: No Food Insecurity (11/17/2024)   Epic    Worried About Programme Researcher, Broadcasting/film/video in the Last Year: Never true    Ran Out of Food in the Last Year: Never true  Transportation Needs: No Transportation Needs (11/17/2024)   Epic    Lack of Transportation (Medical): No    Lack of Transportation (Non-Medical): No  Physical Activity: Not on file  Stress: Not on file  Social Connections: Not on file  Intimate Partner Violence: Unknown (11/17/2024)   Epic    Fear of Current or Ex-Partner: No    Emotionally Abused: No    Physically Abused:  Not on file    Sexually Abused: Not on file  Depression (PHQ2-9): Not on file  Alcohol Screen: Low Risk (11/18/2024)   Alcohol Screen    Last Alcohol Screening Score (AUDIT): 4  Housing: Low Risk (11/17/2024)   Epic    Unable to Pay for Housing in the Last Year: No    Number of Times Moved in the Last Year: 0    Homeless in the Last Year: No  Utilities: Not At Risk (11/17/2024)   Epic    Threatened with loss of utilities: No  Health Literacy: Not on file    FH:  Family History  Problem Relation Age of Onset   Heart attack Other        Grandfather   Heart attack Other        Uncle    Past Medical History:  Diagnosis Date   Asthma    Diabetes mellitus without complication (HCC)    Other emphysema (HCC)     Unspecified essential hypertension     Current Outpatient Medications  Medication Sig Dispense Refill   apixaban  (ELIQUIS ) 5 MG TABS tablet Take 1 tablet (5 mg total) by mouth 2 (two) times daily. 180 tablet 3   aspirin  EC 325 MG tablet Take 325 mg by mouth daily.     atorvastatin  (LIPITOR) 40 MG tablet Take 1 tablet (40 mg total) by mouth daily. 90 tablet 3   blood glucose meter kit and supplies Dispense based on patient and insurance preference. Use up to four times daily as directed. (FOR ICD-10 E10.9, E11.9). 1 each 0   Blood Glucose Monitoring Suppl DEVI 1 each by Does not apply route in the morning, at noon, and at bedtime. May substitute to any manufacturer covered by patient's insurance. 1 each 0   EPINEPHrine (PRIMATENE MIST IN) Inhale 2 puffs into the lungs daily as needed (shortness of breath).     insulin  aspart protamine - aspart (NOVOLOG  70/30 MIX) (70-30) 100 UNIT/ML FlexPen Inject 35 Units into the skin 2 (two) times daily with a meal. 30 mL 0   Insulin  Pen Needle (PEN NEEDLES 3/16) 31G X 5 MM MISC 1 application  by Does not apply route 2 (two) times daily. 30 each 3   Insulin  Pen Needle 32G X 4 MM MISC Use as directed with insulin  100 each 0   Lancets MISC Use up to four times daily as directed. (FOR ICD-10 E10.9, E11.9). 100 each 0   metFORMIN  (GLUCOPHAGE ) 1000 MG tablet Take 1 tablet (1,000 mg total) 2 (two) times daily by mouth. 30 tablet 0   metoprolol  succinate (TOPROL -XL) 25 MG 24 hr tablet Take 2 tablets (50 mg total) by mouth daily. 180 tablet 3   Multiple Vitamin (MULTIVITAMIN WITH MINERALS) TABS tablet Take 1 tablet by mouth daily. 30 tablet 11   Multiple Vitamins-Minerals (CERTAVITE SENIOR/ANTIOXIDANT) TABS Take 1 tablet by mouth daily.     nitroGLYCERIN  (NITROSTAT ) 0.4 MG SL tablet Place 1 tablet (0.4 mg total) under the tongue every 5 (five) minutes as needed for chest pain. 25 tablet 0   sacubitril -valsartan  (ENTRESTO ) 49-51 MG Take 1 tablet by mouth 2 (two) times  daily. 180 tablet 3   spironolactone  (ALDACTONE ) 50 MG tablet Take 1 tablet (50 mg total) by mouth daily. 90 tablet 3   No current facility-administered medications for this encounter.    Vitals:   12/27/24 1033  BP: (!) 147/92  Pulse: 81  SpO2: 95%  Weight: 91.9 kg (202 lb 9.6  oz)  Height: 5' 9 (1.753 m)    Wt Readings from Last 3 Encounters:  12/27/24 91.9 kg (202 lb 9.6 oz)  12/13/24 91.9 kg (202 lb 9.6 oz)  11/22/24 91.4 kg (201 lb 9.6 oz)   PHYSICAL EXAM: General:  elderly appearing.  No respiratory difficulty. Walked into clinic.  Neck: JVD flat.  Cor: Regular rate & rhythm. No murmurs. Lungs: clear Extremities: no edema  Neuro: alert & oriented x 3. Affect pleasant.   ASSESSMENT & PLAN: HFrEF/NICM -Dates back to 2021. EF 30-35% at that time. -Echo 11/25: LVEF 20-25%, cannot r/o early apical LV thrombus, R WMA, RV okay, grade II plaque in descending aorta -LHC 11/25 with no significant CAD -Etiology not certain. Possibly 2/2 ETOH abuse +/- HTN (although BP does not appear markedly elevated). -NYHA II. Appears euvolemic. Does not need diuretics.  -Continue toprol  xl to 50 mg daily -Increase entresto  to 49-51 mg twice a day. He is getting entresto  from [facility], needs to follow up with them because he did not increase at last visit as he has not received them. Will provide samples today.  Labs today.  - Continue spiro 50 mg daily for now.  - No SGLT2i with hx DKA, Hgb A1C 11.7 - Plan to repeat ECHO after HF meds optimized in 2-3 months.  - We discussed importance of alcohol cessation.    Hx LV thrombus - no bleeding issues.  - Continue eliquis  5 mg BID. Denies abnormal bleeding - Has ASA 325 in his medicine bag. Will stop today as he is on DOAC.    Uncontrolled DM II -DKA after running out of insulin  -A1c 11.6 in 11/25 -He has had follow up at [facility].     ETOH abuse - Continue to cut back.  - Discussed cessation.  Previously offered to  give information for AA but he declined.    Tobacco abuse Smoking 2 packs per week. Discussed cesation.  - uses nicotine  patches   SDOH He is uninsured. Able to get meds from [facility].   Follow up as scheduled with Dr. Zenaida.    Beckey LITTIE Coe AGACNP-BC  10:54 AM  "

## 2024-12-24 NOTE — Telephone Encounter (Signed)
 Patient was approved to receive Eliquis  from [employer] Effective 12/23/2024 to 12/22/2025

## 2024-12-26 ENCOUNTER — Telehealth (HOSPITAL_COMMUNITY): Payer: Self-pay

## 2024-12-26 ENCOUNTER — Other Ambulatory Visit (HOSPITAL_COMMUNITY): Payer: Self-pay

## 2024-12-26 NOTE — Telephone Encounter (Signed)
 Called to confirm/remind patient of their appointment at the [facility] on 12/27/24 10:30.   Appointment:   [x] Confirmed  [] Left mess   [] No answer/No voice mail  [] VM Full/unable to leave message  [] Phone not in service  Patient reminded to bring all medications and/or complete list.  Confirmed patient has transportation. Gave directions, instructed to utilize valet parking.

## 2024-12-26 NOTE — Telephone Encounter (Deleted)
 Called to confirm/remind patient of their appointment at the [facility] on 12/27/24/26                              .   Appointment:   [] Confirmed  [] Left mess   [] No answer/No voice mail  [] VM Full/unable to leave message  [] Phone not in service  Patient reminded to bring all medications and/or complete list.  Confirmed patient has transportation. Gave directions, instructed to utilize valet parking.

## 2024-12-27 ENCOUNTER — Ambulatory Visit (HOSPITAL_COMMUNITY)
Admission: RE | Admit: 2024-12-27 | Discharge: 2024-12-27 | Disposition: A | Payer: Self-pay | Source: Ambulatory Visit | Attending: Internal Medicine

## 2024-12-27 ENCOUNTER — Ambulatory Visit (HOSPITAL_COMMUNITY): Payer: Self-pay | Admitting: Internal Medicine

## 2024-12-27 ENCOUNTER — Encounter (HOSPITAL_COMMUNITY): Payer: Self-pay

## 2024-12-27 VITALS — BP 147/92 | HR 81 | Ht 69.0 in | Wt 202.6 lb

## 2024-12-27 DIAGNOSIS — I428 Other cardiomyopathies: Secondary | ICD-10-CM | POA: Insufficient documentation

## 2024-12-27 DIAGNOSIS — E119 Type 2 diabetes mellitus without complications: Secondary | ICD-10-CM | POA: Insufficient documentation

## 2024-12-27 DIAGNOSIS — Z86718 Personal history of other venous thrombosis and embolism: Secondary | ICD-10-CM | POA: Insufficient documentation

## 2024-12-27 DIAGNOSIS — I5022 Chronic systolic (congestive) heart failure: Secondary | ICD-10-CM | POA: Insufficient documentation

## 2024-12-27 DIAGNOSIS — I110 Hypertensive heart disease with heart failure: Secondary | ICD-10-CM | POA: Insufficient documentation

## 2024-12-27 DIAGNOSIS — F1010 Alcohol abuse, uncomplicated: Secondary | ICD-10-CM

## 2024-12-27 DIAGNOSIS — Z139 Encounter for screening, unspecified: Secondary | ICD-10-CM

## 2024-12-27 DIAGNOSIS — Z7984 Long term (current) use of oral hypoglycemic drugs: Secondary | ICD-10-CM | POA: Insufficient documentation

## 2024-12-27 DIAGNOSIS — Z79899 Other long term (current) drug therapy: Secondary | ICD-10-CM | POA: Insufficient documentation

## 2024-12-27 DIAGNOSIS — Z5971 Insufficient health insurance coverage: Secondary | ICD-10-CM | POA: Insufficient documentation

## 2024-12-27 DIAGNOSIS — I513 Intracardiac thrombosis, not elsewhere classified: Secondary | ICD-10-CM

## 2024-12-27 DIAGNOSIS — Z794 Long term (current) use of insulin: Secondary | ICD-10-CM | POA: Insufficient documentation

## 2024-12-27 DIAGNOSIS — Z59868 Other specified financial insecurity: Secondary | ICD-10-CM | POA: Insufficient documentation

## 2024-12-27 DIAGNOSIS — E1169 Type 2 diabetes mellitus with other specified complication: Secondary | ICD-10-CM

## 2024-12-27 DIAGNOSIS — J4489 Other specified chronic obstructive pulmonary disease: Secondary | ICD-10-CM | POA: Insufficient documentation

## 2024-12-27 DIAGNOSIS — F17210 Nicotine dependence, cigarettes, uncomplicated: Secondary | ICD-10-CM | POA: Insufficient documentation

## 2024-12-27 DIAGNOSIS — Z7901 Long term (current) use of anticoagulants: Secondary | ICD-10-CM | POA: Insufficient documentation

## 2024-12-27 DIAGNOSIS — Z720 Tobacco use: Secondary | ICD-10-CM

## 2024-12-27 LAB — BASIC METABOLIC PANEL WITH GFR
Anion gap: 12 (ref 5–15)
BUN: 21 mg/dL — ABNORMAL HIGH (ref 6–20)
CO2: 23 mmol/L (ref 22–32)
Calcium: 9.8 mg/dL (ref 8.9–10.3)
Chloride: 105 mmol/L (ref 98–111)
Creatinine, Ser: 0.89 mg/dL (ref 0.61–1.24)
GFR, Estimated: >60 mL/min
Glucose, Bld: 138 mg/dL — ABNORMAL HIGH (ref 70–99)
Potassium: 4.5 mmol/L (ref 3.5–5.1)
Sodium: 140 mmol/L (ref 135–145)

## 2024-12-27 LAB — PRO BRAIN NATRIURETIC PEPTIDE: Pro Brain Natriuretic Peptide: <50 pg/mL

## 2024-12-27 NOTE — Progress Notes (Signed)
 Medication Samples have been provided to the patient.  Drug name: Entresto        Strength: 49/51 mg        Qty: 2 boxes  LOT: [id number]  Exp.Date: 12-18-25  Dosing instructions: take 1 tablet Twice daily   The patient has been instructed regarding the correct time, dose, and frequency of taking this medication, including desired effects and most common side effects.   Christopher Cross M [name] 10:56 AM 12/27/2024

## 2024-12-27 NOTE — Patient Instructions (Addendum)
 PLEASE CALL THE [facility] AT [phone], TO SEE WHERE YOUR ENTRESTO  DELIVERY IS.  STOP Asprin.  Labs done today, your results will be available in MyChart, we will contact you for abnormal readings.  KEEP FOLLOW UP AS SCHEDULED.   If you have any questions or concerns before your next appointment please send us  a message through [place] or call our office at [phone].    TO LEAVE A MESSAGE FOR THE NURSE SELECT OPTION 2, PLEASE LEAVE A MESSAGE INCLUDING: YOUR NAME DATE OF BIRTH CALL BACK NUMBER REASON FOR CALL**this is important as we prioritize the call backs  YOU WILL RECEIVE A CALL BACK THE SAME DAY AS LONG AS YOU CALL BEFORE 4:00 PM  At the [facility], you and your health needs are our priority. As part of our continuing mission to provide you with exceptional heart care, we have created designated Provider Care Teams. These Care Teams include your primary Cardiologist (physician) and Advanced Practice Providers (APPs- Physician Assistants and Nurse Practitioners) who all work together to provide you with the care you need, when you need it.   You may see any of the following providers on your designated Care Team at your next follow up: Dr Toribio Fuel Dr Ezra Shuck Dr. [name] Greig Mosses, NP Caffie Shed, [place] [facility] [place], [place] Beckey Coe, NP Jordan Lee, NP Ellouise Class, NP Tinnie Redman, PharmD Jaun Bash, PharmD   Please be sure to bring in all your medications bottles to every appointment.    Thank you for choosing [facility]

## 2025-01-24 ENCOUNTER — Other Ambulatory Visit (HOSPITAL_COMMUNITY): Payer: Self-pay

## 2025-01-24 ENCOUNTER — Ambulatory Visit (HOSPITAL_COMMUNITY): Admission: RE | Admit: 2025-01-24 | Payer: Self-pay | Source: Ambulatory Visit | Admitting: Cardiology

## 2025-01-24 ENCOUNTER — Other Ambulatory Visit: Payer: Self-pay

## 2025-01-24 VITALS — BP 140/86 | HR 95 | Wt 203.8 lb

## 2025-01-24 DIAGNOSIS — I5022 Chronic systolic (congestive) heart failure: Secondary | ICD-10-CM

## 2025-01-24 MED ORDER — METOPROLOL SUCCINATE ER 100 MG PO TB24
100.0000 mg | ORAL_TABLET | Freq: Every day | ORAL | 3 refills | Status: AC
  Filled 2025-01-24: qty 90, 90d supply, fill #0

## 2025-01-24 NOTE — Patient Instructions (Signed)
 CHANGE Toprol  to 100 mg daily.  Your physician has requested that you have an echocardiogram. Echocardiography is a painless test that uses sound waves to create images of your heart. It provides your doctor with information about the size and shape of your heart and how well your hearts chambers and valves are working. This procedure takes approximately one hour. There are no restrictions for this procedure. Please do NOT wear cologne, perfume, aftershave, or lotions (deodorant is allowed). Please arrive 15 minutes prior to your appointment time.  Please note: We ask at that you not bring children with you during ultrasound (echo/ vascular) testing. Due to room size and safety concerns, children are not allowed in the ultrasound rooms during exams. Our front office staff cannot provide observation of children in our lobby area while testing is being conducted. An adult accompanying a patient to their appointment will only be allowed in the ultrasound room at the discretion of the ultrasound technician under special circumstances. We apologize for any inconvenience.  Please follow up with our heart failure pharmacist in 1 month  Your physician recommends that you schedule a follow-up appointment in: 2 months.  If you have any questions or concerns before your next appointment please send us  a message through [place] or call our office at [phone].    TO LEAVE A MESSAGE FOR THE NURSE SELECT OPTION 2, PLEASE LEAVE A MESSAGE INCLUDING: YOUR NAME DATE OF BIRTH CALL BACK NUMBER REASON FOR CALL**this is important as we prioritize the call backs  YOU WILL RECEIVE A CALL BACK THE SAME DAY AS LONG AS YOU CALL BEFORE 4:00 PM  At the [facility], you and your health needs are our priority. As part of our continuing mission to provide you with exceptional heart care, we have created designated Provider Care Teams. These Care Teams include your primary Cardiologist (physician) and  Advanced Practice Providers (APPs- Physician Assistants and Nurse Practitioners) who all work together to provide you with the care you need, when you need it.   You may see any of the following providers on your designated Care Team at your next follow up: Dr Toribio Fuel Dr Ezra Shuck Dr. [name] Greig Mosses, NP Caffie Shed, [place] [facility] [place], [place] Beckey Coe, NP Jordan Lee, NP Ellouise Class, NP Tinnie Redman, PharmD Jaun Bash, PharmD   Please be sure to bring in all your medications bottles to every appointment.    Thank you for choosing [facility]

## 2025-01-24 NOTE — Progress Notes (Incomplete)
" ° °  ADVANCED HEART FAILURE FOLLOW UP CLINIC NOTE  Referring Physician: Carlette Benita Area, MD  Primary Care: Carlette Benita Area, MD Primary Cardiologist:  HPI: Christopher Cross is a 59 y.o. male who presents for follow up of ***.      Echo 2021- LVEF 30-35%    Previously followed by Dr. Alvan with Cardiology but has not been seen since 2022   Admitted 11/25 with DKA after running out of insulin  and chest pain w/ mildly elevated troponin. LHC with minimal CAD, LVEDP 19.   Followed in [facility] clinic initially, GDMT titrated.      SUBJECTIVE:  PMH, current medications, allergies, social history, and family history reviewed in epic.  PHYSICAL EXAM: There were no vitals filed for this visit. GENERAL: Well nourished and in no apparent distress at rest.  PULM:  Normal work of breathing, clear to auscultation bilaterally. Respirations are unlabored.  CARDIAC:  JVP: ***         Normal rate with regular rhythm. No murmurs, rubs or gallops.  *** edema. Warm and well perfused extremities. ABDOMEN: Soft, non-tender, non-distended. NEUROLOGIC: Patient is oriented x3 with no focal or lateralizing neurologic deficits.    DATA REVIEW  ECG: ***    ECHO: ***   CATH: 11/2024: Minimal CAD, distal LAD disease,    ASSESSMENT & PLAN:  Chronic systolic heart failure:  Hx LV thrombus:  DM2:    Follow up in ***  Christopher Brownie, MD [facility] Mechanical Circulatory Support 01/24/25 "

## 2025-02-25 ENCOUNTER — Ambulatory Visit (HOSPITAL_COMMUNITY): Payer: Self-pay

## 2025-04-08 ENCOUNTER — Other Ambulatory Visit (HOSPITAL_COMMUNITY): Payer: Self-pay

## 2025-04-08 ENCOUNTER — Ambulatory Visit (HOSPITAL_COMMUNITY): Payer: Self-pay | Admitting: Cardiology
